# Patient Record
Sex: Female | Born: 1950 | Race: Black or African American | Hispanic: No | Marital: Single | Smoking: Former smoker
Health system: Southern US, Community
[De-identification: ages and names within clinical notes are randomized; demographics above are authoritative.]

## PROBLEM LIST (undated history)

## (undated) DIAGNOSIS — E785 Hyperlipidemia, unspecified: Secondary | ICD-10-CM

## (undated) DIAGNOSIS — J45909 Unspecified asthma, uncomplicated: Secondary | ICD-10-CM

## (undated) DIAGNOSIS — K219 Gastro-esophageal reflux disease without esophagitis: Secondary | ICD-10-CM

## (undated) DIAGNOSIS — L309 Dermatitis, unspecified: Secondary | ICD-10-CM

## (undated) DIAGNOSIS — I10 Essential (primary) hypertension: Secondary | ICD-10-CM

## (undated) DIAGNOSIS — J811 Chronic pulmonary edema: Secondary | ICD-10-CM

## (undated) DIAGNOSIS — E119 Type 2 diabetes mellitus without complications: Secondary | ICD-10-CM

## (undated) HISTORY — PX: TUBAL LIGATION: SHX77

## (undated) HISTORY — PX: COLONOSCOPY: SHX174

## (undated) HISTORY — DX: Chronic pulmonary edema: J81.1

## (undated) HISTORY — PX: ABDOMINAL HYSTERECTOMY: SHX81

## (undated) HISTORY — DX: Essential (primary) hypertension: I10

## (undated) HISTORY — DX: Hyperlipidemia, unspecified: E78.5

## (undated) HISTORY — DX: Gastro-esophageal reflux disease without esophagitis: K21.9

---

## 2001-04-04 DIAGNOSIS — I499 Cardiac arrhythmia, unspecified: Secondary | ICD-10-CM

## 2001-04-04 DIAGNOSIS — J811 Chronic pulmonary edema: Secondary | ICD-10-CM

## 2001-04-04 HISTORY — DX: Cardiac arrhythmia, unspecified: I49.9

## 2001-04-04 HISTORY — DX: Chronic pulmonary edema: J81.1

## 2005-04-04 DIAGNOSIS — J45909 Unspecified asthma, uncomplicated: Secondary | ICD-10-CM

## 2005-04-04 HISTORY — DX: Unspecified asthma, uncomplicated: J45.909

## 2007-12-25 ENCOUNTER — Ambulatory Visit (HOSPITAL_COMMUNITY): Admission: RE | Admit: 2007-12-25 | Discharge: 2007-12-25 | Payer: Self-pay | Admitting: Family Medicine

## 2008-01-03 ENCOUNTER — Emergency Department (HOSPITAL_COMMUNITY): Admission: EM | Admit: 2008-01-03 | Discharge: 2008-01-03 | Payer: Self-pay | Admitting: Emergency Medicine

## 2008-01-09 ENCOUNTER — Ambulatory Visit: Payer: Self-pay | Admitting: Cardiology

## 2008-08-22 ENCOUNTER — Encounter: Payer: Self-pay | Admitting: Physician Assistant

## 2008-08-22 ENCOUNTER — Ambulatory Visit: Payer: Self-pay | Admitting: Cardiology

## 2008-08-22 DIAGNOSIS — I10 Essential (primary) hypertension: Secondary | ICD-10-CM | POA: Insufficient documentation

## 2008-08-25 ENCOUNTER — Encounter: Payer: Self-pay | Admitting: Cardiology

## 2008-08-25 LAB — CONVERTED CEMR LAB
BUN: 12 mg/dL
CO2: 26 meq/L
Chloride: 101 meq/L
Creatinine, Ser: 1.2 mg/dL
MCV: 90.5 fL
Potassium: 4.6 meq/L
RBC: 4.65 M/uL
Sodium: 142 meq/L

## 2008-08-28 ENCOUNTER — Encounter: Payer: Self-pay | Admitting: Cardiology

## 2008-09-05 ENCOUNTER — Ambulatory Visit: Payer: Self-pay | Admitting: Cardiology

## 2008-10-23 ENCOUNTER — Encounter: Payer: Self-pay | Admitting: Cardiology

## 2008-10-23 LAB — CONVERTED CEMR LAB
ALT: 15 units/L
AST: 19 units/L
Alkaline Phosphatase: 68 units/L
Cholesterol: 182 mg/dL
HDL: 63 mg/dL
Total Bilirubin: 0.7 mg/dL
Triglyceride fasting, serum: 78 mg/dL

## 2008-11-14 ENCOUNTER — Encounter: Payer: Self-pay | Admitting: Cardiology

## 2008-11-25 ENCOUNTER — Ambulatory Visit: Payer: Self-pay | Admitting: Cardiology

## 2008-12-26 ENCOUNTER — Ambulatory Visit (HOSPITAL_COMMUNITY): Admission: RE | Admit: 2008-12-26 | Discharge: 2008-12-26 | Payer: Self-pay | Admitting: Family Medicine

## 2009-06-13 ENCOUNTER — Encounter (INDEPENDENT_AMBULATORY_CARE_PROVIDER_SITE_OTHER): Payer: Self-pay | Admitting: *Deleted

## 2009-06-13 LAB — CONVERTED CEMR LAB
ALT: 25 units/L
AST: 23 units/L
Albumin: 4.7 g/dL
Chloride: 101 meq/L
HDL: 61 mg/dL
LDL Cholesterol: 91 mg/dL
Potassium: 4.4 meq/L
Sodium: 141 meq/L
TSH: 1.886 microintl units/mL
Triglycerides: 72 mg/dL

## 2009-12-25 ENCOUNTER — Encounter (INDEPENDENT_AMBULATORY_CARE_PROVIDER_SITE_OTHER): Payer: Self-pay | Admitting: *Deleted

## 2009-12-29 ENCOUNTER — Ambulatory Visit (HOSPITAL_COMMUNITY): Admission: RE | Admit: 2009-12-29 | Discharge: 2009-12-29 | Payer: Self-pay | Admitting: Family Medicine

## 2009-12-30 ENCOUNTER — Ambulatory Visit: Payer: Self-pay | Admitting: Cardiology

## 2009-12-30 ENCOUNTER — Encounter (INDEPENDENT_AMBULATORY_CARE_PROVIDER_SITE_OTHER): Payer: Self-pay | Admitting: *Deleted

## 2009-12-30 DIAGNOSIS — R002 Palpitations: Secondary | ICD-10-CM

## 2009-12-30 DIAGNOSIS — E1169 Type 2 diabetes mellitus with other specified complication: Secondary | ICD-10-CM | POA: Insufficient documentation

## 2009-12-30 DIAGNOSIS — J811 Chronic pulmonary edema: Secondary | ICD-10-CM | POA: Insufficient documentation

## 2009-12-30 DIAGNOSIS — E785 Hyperlipidemia, unspecified: Secondary | ICD-10-CM

## 2009-12-30 DIAGNOSIS — K219 Gastro-esophageal reflux disease without esophagitis: Secondary | ICD-10-CM

## 2009-12-30 LAB — CONVERTED CEMR LAB
Albumin: 4.5 g/dL (ref 3.5–5.2)
Alkaline Phosphatase: 72 units/L (ref 39–117)
Cholesterol: 189 mg/dL (ref 0–200)
Glucose, Bld: 86 mg/dL (ref 70–99)
HDL: 59 mg/dL (ref >39)
Total CHOL/HDL Ratio: 3.2
Total Protein: 7.8 g/dL (ref 6.0–8.3)
Triglycerides: 171 mg/dL — ABNORMAL HIGH (ref <150)
Uric Acid, Serum: 4.4 mg/dL (ref 2.4–7.0)

## 2010-01-05 ENCOUNTER — Encounter: Payer: Self-pay | Admitting: Cardiology

## 2010-01-29 ENCOUNTER — Encounter (INDEPENDENT_AMBULATORY_CARE_PROVIDER_SITE_OTHER): Payer: Self-pay | Admitting: *Deleted

## 2010-05-04 NOTE — Miscellaneous (Signed)
Summary: LABS CMP,LIPIDS,TSH,T4,06/13/2009  Clinical Lists Changes  Observations: Added new observation of CALCIUM: 9.8 mg/dL (06/13/2009 11:19) Added new observation of ALBUMIN: 4.7 g/dL (06/13/2009 11:19) Added new observation of PROTEIN, TOT: 8.0 g/dL (06/13/2009 11:19) Added new observation of SGPT (ALT): 25 units/L (06/13/2009 11:19) Added new observation of SGOT (AST): 23 units/L (06/13/2009 11:19) Added new observation of ALK PHOS: 71 units/L (06/13/2009 11:19) Added new observation of CREATININE: 1.00 mg/dL (06/13/2009 11:19) Added new observation of BUN: 15 mg/dL (06/13/2009 11:19) Added new observation of BG RANDOM: 99 mg/dL (06/13/2009 11:19) Added new observation of CO2 PLSM/SER: 29 meq/L (06/13/2009 11:19) Added new observation of CL SERUM: 101 meq/L (06/13/2009 11:19) Added new observation of K SERUM: 4.4 meq/L (06/13/2009 11:19) Added new observation of NA: 141 meq/L (06/13/2009 11:19) Added new observation of LDL: 91 mg/dL (06/13/2009 11:19) Added new observation of HDL: 61 mg/dL (06/13/2009 11:19) Added new observation of TRIGLYC TOT: 72 mg/dL (06/13/2009 11:19) Added new observation of CHOLESTEROL: 166 mg/dL (06/13/2009 11:19) Added new observation of TSH: 1.886 microintl units/mL (06/13/2009 11:19) Added new observation of T4, FREE: 8.0 ng/dL (06/13/2009 11:19)

## 2010-05-04 NOTE — Letter (Signed)
Summary: Appointment - Missed  [facility] [place]. [address] [place] [address]   Phone: [phone]  Fax: [phone]     January 29, 2010 MRN: [id number]   Lindsey Buckley [address] [place],   [address]   Dear [name],  [facility] records indicate you missed your appointment on       01/29/10 NURSE VISIT     .                                    It is very important that we reach you to reschedule this appointment. We look forward to participating in your health care needs. Please contact us at the number listed above at your earliest convenience to reschedule this appointment.     Sincerely,    Public relations account executive

## 2010-05-04 NOTE — Letter (Signed)
Summary: [facility] Lab Work Doctor, general practice at [place]. [address] [place] [address]   Phone: [phone]  Fax: [phone]     December 30, 2009 MRN: [id number]   Lindsey Buckley [address] [place], [place]  [address]      YOUR LAB WORK IS DUE   TOMORROW   December 31, 2009  Please go to [facility], located across the street from [facility] on the second floor.  Hours are Monday - Friday 7am until 7:30pm         Saturday 8am until 12noon    _X_  DO NOT EAT OR DRINK AFTER MIDNIGHT EVENING PRIOR TO LABWORK  __ YOUR LABWORK IS NOT FASTING --YOU MAY EAT PRIOR TO LABWORK

## 2010-05-04 NOTE — Letter (Signed)
Summary: [facility] Results Doctor, general practice at [place]. [address] [place] [address]   Phone: [phone]  Fax: [phone]      January 05, 2010 MRN: [id number]   Lindsey Buckley [address] [place], [place]  [address]   Dear [name],  Your test ordered by Rande Lawman has been reviewed by your physician (or physician assistant) and was found to be normal or stable. Your physician (or physician assistant) felt no changes were needed at this time.  ____ Echocardiogram  ____ Cardiac Stress Test  __X__ Lab Work  ____ Peripheral vascular study of arms, legs or neck  ____ CT scan or X-ray  ____ Lung or Breathing test  ____ Other:  Please continue on current medical treatment.  Thank you. Please continue on current medical treatment.  [name], MD, F.A.C.C

## 2010-05-04 NOTE — Assessment & Plan Note (Signed)
Summary: 1 YR F/U PER PT PHONE CALL/TG  Medications Added METOPROLOL SUCCINATE 100 MG XR24H-TAB (METOPROLOL SUCCINATE) Take one tablet by mouth daily PRILOSEC 20 MG CPDR (OMEPRAZOLE) take 1 tab every other day NAPROXEN 500 MG TABS (NAPROXEN) take 1 tab daily NITROSTAT 0.4 MG SUBL (NITROGLYCERIN) 1 tablet under tongue at onset of chest pain; you may repeat every 5 minutes for up to 3 doses. CHLORTHALIDONE 25 MG TABS (CHLORTHALIDONE) Take one half  tablet by mouth daily      Allergies Added: NKDA  Visit Type:  Follow-up Primary Provider:  Dr. Cindie Laroche   History of Present Illness: Lindsey Buckley returns to the office for continuing assessment and treatment of chest discomfort and palpitations in the setting of multiple cardiovascular risk factors, most notably obesity and hypertension.  Her antihypertensive regimen has been modified, but she does not know the reason for the reduction in her dose of metoprolol and discontinuation of chlorthalidone.  Blood pressure control has been suboptimal with systolics not infrequently above Q000111Q and diastolics as high as [id number].  Symptomatically, she is improved with occasional brief episodes of chest discomfort and only a single episode of palpitations that she recalls.  She has had gout for which she uses naproxen as needed.  She has not required evaluation in the emergency department nor hospitalization.   Current Medications (verified): 1)  Nifedipine Cr Osmotic 90 Mg Xr24h-Tab (Nifedipine) .Marland Kitchen.. 1 Daily 2)  Metoprolol Succinate 100 Mg Xr24h-Tab (Metoprolol Succinate) .... Take One Tablet By Mouth Daily 3)  Crestor 40 Mg Tabs (Rosuvastatin Calcium) .... Take One Tablet By Mouth Daily. 4)  Verapamil Hcl Cr 240 Mg Xr24h-Cap (Verapamil Hcl) .... Take One Capsule By Mouth Daily 5)  Benicar 40 Mg Tabs (Olmesartan Medoxomil) .Marland Kitchen.. 1 Daily 6)  Nitroglycerin 0.4 Mg Subl (Nitroglycerin) .... One Tablet Under Tongue Every 5 Minutes As Needed For Chest Pain---May Repeat  Times Three 7)  Prilosec 20 Mg Cpdr (Omeprazole) .... Take 1 Tab Every Other Day 8)  Naproxen 500 Mg Tabs (Naproxen) .... Take 1 Tab Daily 9)  Nitrostat 0.4 Mg Subl (Nitroglycerin) .Marland Kitchen.. 1 Tablet Under Tongue At Onset of Chest Pain; You May Repeat Every 5 Minutes For Up To 3 Doses. 10)  Chlorthalidone 25 Mg Tabs (Chlorthalidone) .... Take One Half  Tablet By Mouth Daily  Allergies (verified): No Known Drug Allergies  Past History:  PMH, FH, and Social History reviewed and updated.  Past Medical History: Palpitations, dyspnea and chest discomfort-negative event recorder PULMONARY EDEMA (ICD-514)and chest pain; 2007 insig. CAD and nl EF- [facility] in [place] HYPERLIPIDEMIA (ICD-272.4) HYPERTENSION (ICD-401.9) Gastroesophageal reflux disease  Review of Systems  The patient denies weight loss, weight gain, vision loss, decreased hearing, hoarseness, syncope, peripheral edema, prolonged cough, and headaches.    Vital Signs:  Patient profile:   60 year old female Weight:      203 pounds BMI:     33.90 Pulse rate:   83 / minute BP sitting:   139 / 83  (right arm)  Vitals Entered By: Lindsey Sou, CNA (December 30, 2009 1:36 PM)  Physical Exam  General:  Obese; wWell-developed; no acute distress   Neck: No JVD, no carotid bruits;  Lungs: No tachypnea, clear without rales, rhonchi or wheezes;  Cardiovascular: normal PMI; normal S1 and S2; fourth heart sound present Abdomen: BS normal; soft and non-tender without masses or organomegaly;  Skin:  Warm, no significant lesions;  Extremities: No edema; distal pulses are intact    Impression &  Recommendations:  Problem # 1:  CHEST PAIN (ICD-786.50) Chest pain is manageable in terms of intensity and frequency and almost certainly does not reflect coronary artery disease.  We will continue to treat symptomatically and have provided the patient with a prescription for nitroglycerin.  Problem # 2:  HYPERTENSION (ICD-401.9) Blood  pressure control is suboptimal on 4 drugs including two calcium channel antagonists.  Chlorthalidone is likely to be the most effective single agent that could be added to this medical regimen.  In case the drug was discontinued as a result of electrolyte abnormalities or renal dysfunction, the dose will be decreased to 12.5 mg q.d.  I will check a metabolic profile in one month at which time Lindsey Buckley will return for reassessment of blood pressure by the cardiology nurses.  I will see her again in one year.  I requested that she increase her activity level and advised her that weight loss would be of significant benefit to her.  Other Orders: Future Orders: T-Basic Metabolic Panel ([id number]) ... 01/07/2010 T-Comprehensive Metabolic Panel ([id number]) ... 12/31/2009 T-Lipid Profile [phone]) ... 12/31/2009 T-Uric Acid (Blood) [phone]) ... 12/31/2009  EKG  Procedure date:  12/30/2009  Findings:      Normal sinus rhythm Left axis deviation Slightly delayed R-wave progression Nonspecific T-wave abnormality, new since a previous tracing of 08/22/08.   Patient Instructions: 1)  Your physician recommends that you schedule a follow-up appointment in: 1 year 2)  Your physician recommends that you return for lab work in: today and in  1 month 3)  You have been referred to nurse visit in   1 month please bring bp diary to nurse visit 4)  Your physician has requested that you regularly monitor and record your blood pressure readings at home.  Please use the same machine at the same time of day to check your readings and record them to bring to your follow-up visit. 5)  Your physician discussed the importance of regular exercise and recommended that you start or continue a regular exercise program for good health. 6)  Your physician has recommended you make the following change in your medication:  change metoprolol tartrate to metoprolol succinate 100mg  daily, chlorthalidone 25mg  , 1/2  tablet daily, nitroglycerin 0.4mg  under tongue evfery 5 minutes x3  as needed for chest pain  Prescriptions: CHLORTHALIDONE 25 MG TABS (CHLORTHALIDONE) Take one half  tablet by mouth daily  #15 x 6   Entered by:   Tye Savoy RN   Authorized by:   Yehuda Savannah, MD, [facility]   Signed by:   Tye Savoy RN on 12/30/2009   Method used:   Electronically to        [facility]. [phone]* (retail)       [address], [place]  [address]       Ph: [phone]       Fax: [phone]   RxID:   [id number]   Handout requested. METOPROLOL SUCCINATE 100 MG XR24H-TAB (METOPROLOL SUCCINATE) Take one tablet by mouth daily  #30 x 6   Entered by:   Tye Savoy RN   Authorized by:   Yehuda Savannah, MD, [facility]   Signed by:   Tye Savoy RN on 12/30/2009   Method used:   Electronically to        [facility]. [phone]* (retail)       [address] [place],  [place]  [address]       Ph: [phone]       Fax: [phone]   [id number] NITROSTAT 0.4 MG SUBL (NITROGLYCERIN) 1 tablet under tongue at onset of chest pain; you may repeat every 5 minutes for up to 3 doses.  #25 x 3   Entered by:   Tye Savoy RN   Authorized by:   Yehuda Savannah, MD, [facility]   Signed by:   Tye Savoy RN on 12/30/2009   Method used:   Electronically to        [facility]. [phone]* (retail)       [address], [place]  [address]       Ph: [phone]       Fax: [phone]   RxID:   [id number]

## 2010-07-16 ENCOUNTER — Other Ambulatory Visit: Payer: Self-pay | Admitting: Cardiology

## 2010-08-16 ENCOUNTER — Other Ambulatory Visit: Payer: Self-pay | Admitting: Cardiology

## 2010-08-17 NOTE — Letter (Signed)
January 09, 2008    Unk Lightning, MD  [address] [place] [address]   RE:  Lindsey Buckley, Lindsey Buckley  MRN:  [id number]  /  DOB:  27-Dec-1950   Dear Delfino Lovett:   [name] is seen in the office today after evaluation in the Emergency  Department for chest discomfort.  This nice woman has long history of  hypertension that has generally been well controlled.  She has also had  hyperlipidemia that has not been at optimal levels despite high dose  statins.  She was hospitalized at [place]  in 2007 with pulmonary  edema and respiratory failure requiring mechanical ventilation.  Stress  testing and cardiac catheterization at that time showed low normal left  ventricular systolic function and minimal coronary artery disease.  A CT  scan of chest was unrevealing.  She eventually improved on medical  therapy and has had no recurrent signs or symptoms of congestive heart  failure.  She has continued to have intermittent chest discomfort.  Most  recently, she developed left inframammary pain that occurred  unpredictably and lasted minutes to hours.  This has been prominent over  the past week or 2, but has been absent for the past day or 2.  She  previously took nitroglycerin, but was advised that this was not  necessary.  Evaluation in the Emergency Department was unrevealing.   Mr. Lindsey Buckley brings about a inch of medical records, these were reviewed.  No etiology for chest discomfort was determined in [place].  She had  slightly abnormal renal function at that time.   She is also said to have a history of GERD, but she denies this, and is  receiving no medication for that problem at the present time.  She  recently had a lipid profile showing a total cholesterol of 250 by her  report prompting initiation of treatment with WelChol.  She has not  tolerated this due to adverse GI effects.   CURRENT MEDICATIONS:  1. Nifedipine XL 90 mg daily.  2. Metoprolol ER 100 mg  daily.  3. HCTZ 25 mg daily.  4. Simvastatin 80 mg daily.   ALLERGIES:  She has no known medical allergies.  She has suffered an  adverse reaction to ACE inhibitors in the past.   SOCIAL HISTORY:  Unemployed; previously worked as a Psychologist, counselling.  She walks on a daily basis without difficulty.  She is divorced with 3  adult children.  She moved to this area to be closer to family.   FAMILY HISTORY:  Father and mother are both deceased, but had no  significant chronic medical conditions.  One of 4 siblings underwent  CABG surgery and one has uterine cancer.   REVIEW OF SYSTEMS:  Need for corrective lenses; partial upper dentures;  occasional mild palpitations; occasional diarrhea; arthritic discomfort  in her back.  She has a history of asthma.   PHYSICAL EXAMINATION:  GENERAL:  A pleasant overweight woman, in no  acute distress.  The weight is 196.  HEENT:  Anicteric sclerae; normal lids and conjunctivae.  NECK:  No jugular venous distention; normal carotid upstrokes without  bruits.  ENDOCRINE:  No thyromegaly.  HEMATOPOIETIC:  No adenopathy.  LUNGS:  Clear.  CARDIAC:  Normal first and second heart sounds.  ABDOMEN:  Soft and nontender; no organomegaly.  EXTREMITIES:  Normal distal pulses; no edema.  NEUROLOGIC:  Symmetric strength and tone; normal cranial nerves.   EKG:  Sinus bradycardia; left axis deviation;  nonspecific T-wave  abnormality.  No previous tracing for comparison.   Laboratory from the Emergency Department shows a normal CBC, normal  chemistry profile including a creatinine of 1.0, normal LFTs and normal  cardiac markers.   IMPRESSION:  [name] has intermittent chest discomfort that is  atypical and not clearly of cardiac etiology.  The etiology of  respiratory failure in 2007 is not entirely clear.  There was some  findings suggesting pneumonia, but radiographically, her problem was  read as diffuse pulmonary edema.  She may have had congestive  heart  failure with preserved left ventricular systolic function.  In any case,  she has done quite well on medical therapy, which will be continued.   Lipid lowering therapy is suboptimal.  I have asked her to change from  simvastatin to rosuvastatin 40 mg daily.  If this is inadequate, as an  MI, should provide additional lipid lowering efficacy.   I appreciate the opportunity to follow this nice woman with you and will  see her again in 4 months.    Sincerely,      Cristopher Estimable. Lattie Haw, MD, [facility]  Electronically Signed    RMR/MedQ  DD: 01/09/2008  DT: 01/10/2008  Job #: [id number]

## 2010-09-18 ENCOUNTER — Other Ambulatory Visit: Payer: Self-pay | Admitting: Cardiology

## 2010-11-26 ENCOUNTER — Other Ambulatory Visit (HOSPITAL_COMMUNITY): Payer: Self-pay | Admitting: Family Medicine

## 2010-11-26 DIAGNOSIS — Z139 Encounter for screening, unspecified: Secondary | ICD-10-CM

## 2011-01-03 ENCOUNTER — Ambulatory Visit (HOSPITAL_COMMUNITY)
Admission: RE | Admit: 2011-01-03 | Discharge: 2011-01-03 | Disposition: A | Discharge status: Home / Self Care | Payer: Medicaid Other | Source: Ambulatory Visit | Attending: Family Medicine | Admitting: Family Medicine

## 2011-01-03 DIAGNOSIS — Z1231 Encounter for screening mammogram for malignant neoplasm of breast: Secondary | ICD-10-CM | POA: Insufficient documentation

## 2011-01-03 DIAGNOSIS — Z139 Encounter for screening, unspecified: Secondary | ICD-10-CM

## 2011-01-04 LAB — COMPREHENSIVE METABOLIC PANEL
AST: 25
Albumin: 3.9
Alkaline Phosphatase: 73
BUN: 24 — ABNORMAL HIGH
CO2: 26
Calcium: 9.6
Chloride: 106
Creatinine, Ser: 1.02
GFR calc Af Amer: >60
GFR calc non Af Amer: 56 — ABNORMAL LOW
Total Bilirubin: 0.9
Total Protein: 7.8

## 2011-01-04 LAB — CBC
Hemoglobin: 13.9
RDW: 13.4
WBC: 9.8

## 2011-01-04 LAB — POCT CARDIAC MARKERS
CKMB, poc: 2.2
Myoglobin, poc: 60.3
Myoglobin, poc: 67.7

## 2011-03-16 ENCOUNTER — Ambulatory Visit (INDEPENDENT_AMBULATORY_CARE_PROVIDER_SITE_OTHER): Payer: Medicaid Other | Admitting: Adult Health

## 2011-03-16 ENCOUNTER — Encounter: Payer: Self-pay | Admitting: Adult Health

## 2011-03-16 ENCOUNTER — Ambulatory Visit (HOSPITAL_COMMUNITY)
Admission: RE | Admit: 2011-03-16 | Discharge: 2011-03-16 | Disposition: A | Discharge status: Home / Self Care | Payer: Medicaid Other | Source: Ambulatory Visit | Attending: Adult Health | Admitting: Adult Health

## 2011-03-16 DIAGNOSIS — R0989 Other specified symptoms and signs involving the circulatory and respiratory systems: Secondary | ICD-10-CM

## 2011-03-16 DIAGNOSIS — J811 Chronic pulmonary edema: Secondary | ICD-10-CM

## 2011-03-16 DIAGNOSIS — E785 Hyperlipidemia, unspecified: Secondary | ICD-10-CM

## 2011-03-16 DIAGNOSIS — R059 Cough, unspecified: Secondary | ICD-10-CM | POA: Insufficient documentation

## 2011-03-16 DIAGNOSIS — E782 Mixed hyperlipidemia: Secondary | ICD-10-CM

## 2011-03-16 DIAGNOSIS — R05 Cough: Secondary | ICD-10-CM | POA: Insufficient documentation

## 2011-03-16 DIAGNOSIS — I10 Essential (primary) hypertension: Secondary | ICD-10-CM

## 2011-03-16 MED ORDER — GUAIFENESIN ER 600 MG PO TB12: 1200.0000 mg | ORAL_TABLET | Freq: Two times a day (BID) | ORAL | Status: DC

## 2011-03-16 MED ORDER — ROSUVASTATIN CALCIUM 40 MG PO TABS: 40.0000 mg | ORAL_TABLET | Freq: Every day | ORAL | Status: DC

## 2011-03-16 MED ORDER — GUAIFENESIN ER 600 MG PO TB12: 600.0000 mg | ORAL_TABLET | Freq: Two times a day (BID) | ORAL | Status: DC

## 2011-03-16 NOTE — Patient Instructions (Signed)
Your physician recommends that you schedule a follow-up appointment in: 6 months  Chest x ray today  Your physician has recommended you make the following change in your medication: Mucinex 600 mg 2 tabs twice daily

## 2011-03-16 NOTE — Assessment & Plan Note (Signed)
Currently well controlled on 4 antihypertensives. She has had BMET per Dr. Lorriane Shire two weeks ago. Will request records. No changes in her medications.

## 2011-03-16 NOTE — Assessment & Plan Note (Signed)
Refill of crestor is provided. Labs are followed by Dr. Lorriane Shire but she ran out of medications.

## 2011-03-16 NOTE — Progress Notes (Signed)
   HPI: Lindsey Buckley is a 60 y/o patient of Dr. Lattie Haw we are following for ongoing annual assessment and treatment of hypertension and palpitations. She is complaining today of chest congestion and coughing. She is on steroids and abx at this time per Dr. Lorriane Shire. She has less palpitations and BP is well controlled. She has no complaints of chest discomfort.  Allergies  Allergen Reactions  . Ace Inhibitors   . Lisinopril     Current Outpatient Prescriptions  Medication Sig Dispense Refill  . chlorthalidone (HYGROTON) 25 MG tablet TAKE 1/2 TABLET BY MOUTH DAILY  45 tablet  5  . metoprolol (TOPROL-XL) 50 MG 24 hr tablet Take 50 mg by mouth daily.        . naproxen (NAPROSYN) 500 MG tablet Take 500 mg by mouth as needed.        Marland Kitchen NIFEdipine (ADALAT CC) 90 MG 24 hr tablet Take 90 mg by mouth daily.        Marland Kitchen olmesartan (BENICAR) 40 MG tablet Take 40 mg by mouth daily.        Marland Kitchen omeprazole (PRILOSEC) 20 MG capsule Take 20 mg by mouth daily.        . rosuvastatin (CRESTOR) 40 MG tablet Take 1 tablet (40 mg total) by mouth daily.  90 tablet  3  . verapamil (COVERA HS) 240 MG (CO) 24 hr tablet Take 240 mg by mouth at bedtime.        Marland Kitchen guaiFENesin (MUCINEX) 600 MG 12 hr tablet Take 2 tablets (1,200 mg total) by mouth 2 (two) times daily.  60 tablet  2    Past Medical History  Diagnosis Date  . Pulmonary congestion and hypostasis   . Other and unspecified hyperlipidemia   . Unspecified essential hypertension   . Chest pain     history of  . GERD (gastroesophageal reflux disease)       [id number] of systems complete and found to be negative unless listed above PHYSICAL EXAM BP 129/85  Pulse 71  Ht 5\' 4"  (1.626 m)  Wt 203 lb (92.08 kg)  BMI 34.84 kg/m2  General: Well developed, well nourished, in no acute distress Head: Eyes PERRLA, No xanthomas.   Normal cephalic and atramatic  Lungs: Bilateral crackles without wheezes or rhonchi, frequent cough. Heart: HRRR S1 S2, without MRG.   Pulses are 2+ & equal.            No carotid bruit. No JVD.  No abdominal bruits. No femoral bruits. Abdomen: Bowel sounds are positive, abdomen soft and non-tender without masses or                  Hernia's noted. Msk:  Back normal, normal gait. Normal strength and tone for age. Extremities: No clubbing, cyanosis or edema.  DP +1 Neuro: Alert and oriented X 3. Psych:  Good affect, responds appropriately  EKG:NSR Rate of 67 bpm. Nonspecific anterior changes.  ASSESSMENT AND PLAN

## 2011-03-16 NOTE — Assessment & Plan Note (Signed)
There is no evidence of fluid overload on clinical exam except for lung congestion. She has not had a CXR to evaluate her congestion, although this is being treated. We will have this completed for further evaluation with copy to Dr. Lorriane Shire.

## 2011-03-17 ENCOUNTER — Telehealth: Payer: Self-pay | Admitting: Adult Health

## 2011-03-17 NOTE — Telephone Encounter (Signed)
PT WOULD LIKE X-RAY RESULTS/TMJ

## 2011-03-17 NOTE — Telephone Encounter (Signed)
Results called to patient.

## 2011-03-22 ENCOUNTER — Other Ambulatory Visit: Payer: Self-pay | Admitting: Cardiology

## 2011-03-22 ENCOUNTER — Encounter: Payer: Self-pay | Admitting: Cardiology

## 2011-03-23 ENCOUNTER — Other Ambulatory Visit: Payer: Self-pay | Admitting: *Deleted

## 2011-03-23 MED ORDER — ROSUVASTATIN CALCIUM 40 MG PO TABS: 40.0000 mg | ORAL_TABLET | Freq: Every day | ORAL | Status: DC

## 2011-04-13 ENCOUNTER — Telehealth: Payer: Self-pay

## 2011-05-03 ENCOUNTER — Other Ambulatory Visit: Payer: Self-pay | Admitting: *Deleted

## 2011-05-03 MED ORDER — ROSUVASTATIN CALCIUM 40 MG PO TABS: 40.0000 mg | ORAL_TABLET | Freq: Every day | ORAL | Status: DC

## 2011-08-25 ENCOUNTER — Other Ambulatory Visit: Payer: Self-pay | Admitting: Adult Health

## 2011-09-13 ENCOUNTER — Ambulatory Visit (INDEPENDENT_AMBULATORY_CARE_PROVIDER_SITE_OTHER): Payer: Medicaid Other | Admitting: Adult Health

## 2011-09-13 ENCOUNTER — Encounter: Payer: Self-pay | Admitting: Adult Health

## 2011-09-13 VITALS — BP 131/85 | HR 69 | Ht 65.0 in | Wt 204.0 lb

## 2011-09-13 DIAGNOSIS — I10 Essential (primary) hypertension: Secondary | ICD-10-CM

## 2011-09-13 DIAGNOSIS — E785 Hyperlipidemia, unspecified: Secondary | ICD-10-CM

## 2011-09-13 DIAGNOSIS — R002 Palpitations: Secondary | ICD-10-CM

## 2011-09-13 NOTE — Assessment & Plan Note (Signed)
Currently poorly controlled. She is on higher dose of crestor 40 mg daily. I have encouraged low cholesterol diet. Hopefully follow-up labs with show improvement.

## 2011-09-13 NOTE — Assessment & Plan Note (Signed)
Currently very well controlled on medications. She is without complaint of HA or dizziness. No chest pressure with walking. I have encouraged her to continue exercise regimen and continue to lose weight.

## 2011-09-13 NOTE — Patient Instructions (Signed)
Your physician recommends that you schedule a follow-up appointment in: 6 month follow up

## 2011-09-13 NOTE — Progress Notes (Signed)
   HPI: Lindsey Buckley is a 61 y/o patient of Dr. Lattie Haw we are following for hypertension and palpitations. On last visit she was doing well with the exception of some DOE. She has since felt better with this. She is walking 3 miles a day and losing weight. She has no cardiac complaints at this time. Recent labs have been reviewed. Mildly elevated creatinine of 1.27 is noted. Cholesterol 233, TG 118, HDL 54, LDL 155. She is on crestor and is followed by Dr. Lorriane Shire for this.  Allergies  Allergen Reactions  . Ace Inhibitors   . Lisinopril     Current Outpatient Prescriptions  Medication Sig Dispense Refill  . chlorthalidone (HYGROTON) 25 MG tablet TAKE 1/2 TABLET BY MOUTH DAILY.  15 tablet  3  . metoprolol (TOPROL-XL) 100 MG 24 hr tablet TAKE 1 TABLET BY MOUTH DAILY  30 tablet  6  . naproxen (NAPROSYN) 500 MG tablet Take 500 mg by mouth as needed.        Marland Kitchen NIFEdipine (ADALAT CC) 90 MG 24 hr tablet Take 90 mg by mouth daily.        Marland Kitchen olmesartan (BENICAR) 40 MG tablet Take 40 mg by mouth daily.        Marland Kitchen omeprazole (PRILOSEC) 20 MG capsule Take 20 mg by mouth daily.        . rosuvastatin (CRESTOR) 40 MG tablet Take 1 tablet (40 mg total) by mouth daily.  90 tablet  3  . verapamil (COVERA HS) 240 MG (CO) 24 hr tablet Take 240 mg by mouth at bedtime.        Marland Kitchen DISCONTD: metoprolol (TOPROL-XL) 50 MG 24 hr tablet Take 50 mg by mouth daily.          Past Medical History  Diagnosis Date  . Pulmonary congestion and hypostasis   . Other and unspecified hyperlipidemia   . Unspecified essential hypertension   . Chest pain     history of  . GERD (gastroesophageal reflux disease)   . Pulmonary edema     Past Surgical History  Procedure Date  . Tubal ligation   . Abdominal hysterectomy   . Colonoscopy     [id number] of systems complete and found to be negative unless listed above  PHYSICAL EXAM BP 131/85  Pulse 69  Ht 5\' 5"  (1.651 m)  Wt 204 lb (92.534 kg)  BMI 33.95 kg/m2  General:  Well developed, well nourished, obese in no acute distress Head: Eyes PERRLA, No xanthomas.   Normal cephalic and atramatic  Lungs: Clear bilaterally to auscultation and percussion. Heart: HRRR S1 S2, without MRG.  Pulses are 2+ & equal.            No carotid bruit. No JVD.  No abdominal bruits. No femoral bruits. Abdomen: Bowel sounds are positive, abdomen soft and non-tender without masses or                  Hernia's noted. Msk:  Back normal, normal gait. Normal strength and tone for age. Extremities: No clubbing, cyanosis or edema.  DP +1 Neuro: Alert and oriented X 3. Psych:  Good affect, responds appropriately   ASSESSMENT AND PLAN

## 2011-09-13 NOTE — Assessment & Plan Note (Signed)
Occasional related to stress and caffeine. She is encouraged to cut back on Pepsi.

## 2011-10-27 ENCOUNTER — Other Ambulatory Visit: Payer: Self-pay | Admitting: Adult Health

## 2012-03-03 ENCOUNTER — Other Ambulatory Visit: Payer: Self-pay | Admitting: Adult Health

## 2012-05-16 ENCOUNTER — Encounter: Payer: Self-pay | Admitting: Physician Assistant

## 2012-05-16 ENCOUNTER — Ambulatory Visit: Payer: Medicaid Other | Admitting: Physician Assistant

## 2012-05-16 ENCOUNTER — Ambulatory Visit (INDEPENDENT_AMBULATORY_CARE_PROVIDER_SITE_OTHER): Payer: Medicaid Other | Admitting: Physician Assistant

## 2012-05-16 ENCOUNTER — Encounter: Payer: Self-pay | Admitting: *Deleted

## 2012-05-16 VITALS — BP 124/78 | HR 69 | Ht 65.0 in | Wt 207.8 lb

## 2012-05-16 DIAGNOSIS — I10 Essential (primary) hypertension: Secondary | ICD-10-CM

## 2012-05-16 DIAGNOSIS — R079 Chest pain, unspecified: Secondary | ICD-10-CM

## 2012-05-16 DIAGNOSIS — K219 Gastro-esophageal reflux disease without esophagitis: Secondary | ICD-10-CM

## 2012-05-16 DIAGNOSIS — E785 Hyperlipidemia, unspecified: Secondary | ICD-10-CM

## 2012-05-16 DIAGNOSIS — R002 Palpitations: Secondary | ICD-10-CM

## 2012-05-16 MED ORDER — OMEPRAZOLE 20 MG PO CPDR: 20.0000 mg | DELAYED_RELEASE_CAPSULE | Freq: Two times a day (BID) | ORAL | Status: DC

## 2012-05-16 NOTE — Assessment & Plan Note (Signed)
Patient had 1-1/2 week history of intermittent chest pain that is sometimes relieved with nitroglycerin and other times not. She has had excessive belching and heartburn as well. I believe her pain is probably coming from GI origin. I have asked her to increase her Prilosec to 20 mg b.i.d. We will schedule a stress Myoview to rule out ischemia with her prior history. She will follow up with Dr. Lattie Haw after the stress test.

## 2012-05-16 NOTE — Assessment & Plan Note (Signed)
The patient continues to have help occasions on occasion which she controls by taking an extra Toprol. I told her she can continue to do that.

## 2012-05-16 NOTE — Assessment & Plan Note (Signed)
Patient has had worsening indigestion and belching. I've increased her Prilosec. She may need to see a gastroenterologist.

## 2012-05-16 NOTE — Progress Notes (Signed)
HPI:  This is a very pleasant 62 year old African American female patient Dr. Lattie Haw who is here today for further evaluation of chest pain. She has a history of hypertension and palpitations. She had a remote history of pulmonary edema back in 2007 low living in [place]. She had a cardiac catheterization at [facility] there that showed insignificant CAD and normal ejection fraction. She has had event recorders that have not shown arrhythmias.  The patient comes in today complaining of one half week history of a pressure under her left breast that comes and goes. It happens every 3-4 days and will last 10-15 minutes at a time. She tried nitroglycerin and it sometimes helps but other times doesn't. She has had excessive belching and indigestion lately. She walks 15-20 minutes daily without any symptoms. Activity does not cause any chest pain. She recently saw Dr. Cindie Laroche who ordered blood work and told her she needed a stress test. Her blood work seemed stable except for an elevated triglyceride. She also had palpitations the other night which eased with taking anextra Toprol. This only bothers her periodically. She has no radiation of the pain, associated dyspnea, dizziness, or presyncope.   Allergies: -- Ace Inhibitors   -- Lisinopril   Current Outpatient Prescriptions on File Prior to Visit: chlorthalidone (HYGROTON) 25 MG tablet, TAKE 1/2 TABLET BY MOUTH DAILY., Disp: 15 tablet, Rfl: 6 naproxen (NAPROSYN) 500 MG tablet, Take 500 mg by mouth as needed.  , Disp: , Rfl:  NIFEdipine (ADALAT CC) 90 MG 24 hr tablet, Take 90 mg by mouth daily.  , Disp: , Rfl:  olmesartan (BENICAR) 40 MG tablet, Take 40 mg by mouth daily.  , Disp: , Rfl:  rosuvastatin (CRESTOR) 40 MG tablet, Take 1 tablet (40 mg total) by mouth daily., Disp: 90 tablet, Rfl: 3 TOPROL XL 100 MG 24 hr tablet, TAKE ONE TABLET DAILY., Disp: 30 each, Rfl: 12 verapamil (COVERA HS) 240 MG (CO) 24 hr tablet, Take 240 mg by mouth  at bedtime.  , Disp: , Rfl:   No current facility-administered medications on file prior to visit.   Past Medical History:   Pulmonary congestion and hypostasis                          Other and unspecified hyperlipidemia                         Unspecified essential hypertension                           Chest pain                                                     Comment:history of   GERD (gastroesophageal reflux disease)                       Pulmonary edema                                             Past Surgical History:   TUBAL LIGATION  ABDOMINAL HYSTERECTOMY                                        COLONOSCOPY                                                  Review of patient's family history indicates:   Uterine cancer                 Sister                   Social History   Marital Status: Single              Spouse Name:                      Years of Education:                 Number of children:             Occupational History Occupation          Fish farm manager            Comment              Full time                                 Social History Main Topics   Smoking Status: Former Smoker                   Packs/Day: 0.00  Years:         Smokeless Status: Never Used                       Alcohol Use: No             Drug Use: No             Sexual Activity: Not on file        Other Topics            Concern   None on file  Social History Narrative   Divorced   3 children   No regular exercise    ROS:she complains of significant indigestion recently as well as increased belching, regular hot flashes, otherwise review of systems normal unless stated in history of present illness   PHYSICAL EXAM: Well-nournished, in no acute distress. Neck: No JVD, HJR, Bruit, or thyroid enlargement  Lungs: No tachypnea, clear without wheezing, rales, or rhonchi  Cardiovascular: RRR, PMI not displaced, heart sounds normal,  no murmurs, gallops, bruit, thrill, or heave.  Abdomen: BS normal. Soft without organomegaly, masses, lesions or tenderness.  Extremities: without cyanosis, clubbing or edema. Good distal pulses bilateral  SKin: Warm, no lesions or rashes   Musculoskeletal: No deformities  Neuro: no focal signs  BP 124/78  Pulse 69  Ht 5\' 5"  (1.651 m)  Wt 207 lb 12 oz (94.235 kg)  BMI 34.57 kg/m2  SpO2 97%   [id number] sinus rhythm, low voltage, poor R-wave progression, no acute change

## 2012-05-16 NOTE — Assessment & Plan Note (Signed)
Stable

## 2012-05-16 NOTE — Patient Instructions (Addendum)
Your physician recommends that you schedule a follow-up appointment in: After your Stress Test  Stress Test   Your physician has recommended you make the following change in your medication:  1 - INCREASE Prilosec to 20 mg twice a day

## 2012-05-16 NOTE — Assessment & Plan Note (Signed)
Recent blood work showed increased triglycerides. I spoke to the patient about reducing the sugars in her diet.

## 2012-05-30 ENCOUNTER — Encounter (HOSPITAL_COMMUNITY): Payer: Medicaid Other

## 2012-05-30 ENCOUNTER — Ambulatory Visit (HOSPITAL_COMMUNITY)
Admission: RE | Admit: 2012-05-30 | Discharge: 2012-05-30 | Disposition: A | Discharge status: Home / Self Care | Payer: Medicare Other | Source: Ambulatory Visit | Attending: Physician Assistant | Admitting: Physician Assistant

## 2012-05-30 ENCOUNTER — Encounter (HOSPITAL_COMMUNITY): Payer: Medicare Other

## 2012-05-30 ENCOUNTER — Encounter (HOSPITAL_COMMUNITY)
Admission: RE | Admit: 2012-05-30 | Discharge: 2012-05-30 | Disposition: A | Discharge status: Home / Self Care | Payer: Medicare Other | Source: Ambulatory Visit | Attending: Physician Assistant | Admitting: Physician Assistant

## 2012-05-30 ENCOUNTER — Ambulatory Visit (HOSPITAL_COMMUNITY): Payer: Medicare Other

## 2012-05-30 ENCOUNTER — Encounter (HOSPITAL_COMMUNITY): Payer: Self-pay

## 2012-05-30 DIAGNOSIS — R079 Chest pain, unspecified: Secondary | ICD-10-CM | POA: Insufficient documentation

## 2012-05-30 DIAGNOSIS — R002 Palpitations: Secondary | ICD-10-CM

## 2012-05-30 HISTORY — DX: Unspecified asthma, uncomplicated: J45.909

## 2012-05-30 MED ORDER — TECHNETIUM TC 99M SESTAMIBI - CARDIOLITE
30.0000 | Freq: Once | INTRAVENOUS | Status: AC | PRN
  Administered 2012-05-30: 11:00:00 29 via INTRAVENOUS

## 2012-05-30 MED ORDER — SODIUM CHLORIDE 0.9 % IJ SOLN
INTRAMUSCULAR | Status: AC
  Administered 2012-05-30: 10 mL via INTRAVENOUS
  Filled 2012-05-30: qty 10

## 2012-05-30 MED ORDER — TECHNETIUM TC 99M SESTAMIBI - CARDIOLITE
10.0000 | Freq: Once | INTRAVENOUS | Status: AC | PRN
  Administered 2012-05-30: 09:00:00 10 via INTRAVENOUS

## 2012-05-30 NOTE — Progress Notes (Signed)
Stress Lab Nurses Notes - [name]  Dari D [place] 05/30/2012 Reason for doing test: Chest Pain Type of test: Stress Cardiolite Nurse performing test: Gerrit Halls, RN Nuclear Medicine Tech: Melburn Hake Echo Tech: Not Applicable MD performing test: R. Rothbart & Ermalinda Barrios PA Family MD: DonDiego Test explained and consent signed: yes IV started: 22g jelco, Saline lock flushed, No redness or edema and Saline lock started in radiology Symptoms: SOB & Fatigue Treatment/Intervention: None Reason test stopped: reached target HR After recovery IV was: Discontinued via X-ray tech and No redness or edema Patient to return to [place]. Med at : 12:00 Patient discharged: Home Patient's Condition upon discharge was: stable Comments: During test peak BP 199/81 & HR 144.  Recovery BP 136/77 & HR 90.  Symptoms resolved in recovery. Geanie Cooley T

## 2012-06-05 ENCOUNTER — Ambulatory Visit (INDEPENDENT_AMBULATORY_CARE_PROVIDER_SITE_OTHER): Payer: Medicare Other | Admitting: Cardiology

## 2012-06-05 ENCOUNTER — Encounter: Payer: Self-pay | Admitting: Cardiology

## 2012-06-05 ENCOUNTER — Ambulatory Visit: Payer: Medicare Other | Admitting: Cardiology

## 2012-06-05 VITALS — BP 137/88 | HR 66 | Ht 65.0 in | Wt 207.2 lb

## 2012-06-05 DIAGNOSIS — E785 Hyperlipidemia, unspecified: Secondary | ICD-10-CM

## 2012-06-05 DIAGNOSIS — R002 Palpitations: Secondary | ICD-10-CM

## 2012-06-05 DIAGNOSIS — K219 Gastro-esophageal reflux disease without esophagitis: Secondary | ICD-10-CM

## 2012-06-05 DIAGNOSIS — I10 Essential (primary) hypertension: Secondary | ICD-10-CM

## 2012-06-05 DIAGNOSIS — J811 Chronic pulmonary edema: Secondary | ICD-10-CM

## 2012-06-05 NOTE — Progress Notes (Deleted)
Name: Lindsey Buckley    DOB: 03-Feb-1951  Age: 62 y.o.  MR#: [id number]       PCP:  Maricela Curet, MD      Insurance: Payor: MEDICARE  Plan: MEDICARE PART A AND B  Product Type: *No Product type*    CC:   STRESS TEST 05-30-12 LIST, PCP LABS ENCLOSED   VS Filed Vitals:   06/05/12 1437  BP: 137/88  Pulse: 66  Height: 5\' 5"  (1.651 m)  Weight: 207 lb 4 oz (94.008 kg)    Weights Current Weight  06/05/12 207 lb 4 oz (94.008 kg)  05/16/12 207 lb 12 oz (94.235 kg)  09/13/11 204 lb (92.534 kg)    Blood Pressure  BP Readings from Last 3 Encounters:  06/05/12 137/88  05/16/12 124/78  09/13/11 131/85     Admit date:  (Not on file) Last encounter with RMR:  06/05/2012   Allergy Ace inhibitors and Lisinopril  Current Outpatient Prescriptions  Medication Sig Dispense Refill  . chlorthalidone (HYGROTON) 25 MG tablet TAKE 1/2 TABLET BY MOUTH DAILY.  15 tablet  6  . NIFEdipine (ADALAT CC) 90 MG 24 hr tablet Take 90 mg by mouth daily.        Marland Kitchen olmesartan (BENICAR) 40 MG tablet Take 40 mg by mouth daily.        Marland Kitchen omeprazole (PRILOSEC) 20 MG capsule Take 1 capsule (20 mg total) by mouth 2 (two) times daily.  60 capsule  6  . rosuvastatin (CRESTOR) 40 MG tablet Take 1 tablet (40 mg total) by mouth daily.  90 tablet  3  . TOPROL XL 100 MG 24 hr tablet TAKE ONE TABLET DAILY.  30 each  12  . verapamil (COVERA HS) 240 MG (CO) 24 hr tablet Take 240 mg by mouth at bedtime.         No current facility-administered medications for this visit.    Discontinued Meds:    Medications Discontinued During This Encounter  Medication Reason  . naproxen (NAPROSYN) 500 MG tablet Error    Patient Active Problem List  Diagnosis  . HYPERLIPIDEMIA  . HYPERTENSION  . PULMONARY EDEMA  . GASTROESOPHAGEAL REFLUX DISEASE  . PALPITATIONS  . Chest pain    LABS    Component Value Date/Time   NA 141 12/30/2009 2259   NA 141 06/13/2009   NA 142 08/25/2008 0954   K 4.5 12/30/2009 2259   K 4.4 06/13/2009    K 4.6 08/25/2008 0954   CL 102 12/30/2009 2259   CL 101 06/13/2009   CL 101 08/25/2008 0954   CO2 24 12/30/2009 2259   CO2 29 06/13/2009   CO2 26 08/25/2008 0954   GLUCOSE 86 12/30/2009 2259   GLUCOSE 99 06/13/2009   GLUCOSE 105* 01/03/2008 1635   BUN 22 12/30/2009 2259   BUN 15 06/13/2009   BUN 12 08/25/2008 0954   CREATININE 1.13 12/30/2009 2259   CREATININE 1.00 06/13/2009   CREATININE 1.20 08/25/2008 0954   CALCIUM 9.5 12/30/2009 2259   CALCIUM 9.8 06/13/2009   CALCIUM 10.3 08/25/2008 0954   GFRNONAA 56* 01/03/2008 1635   GFRAA  Value: >60        The eGFR has been calculated using the MDRD equation. This calculation has not been validated in all clinical 01/03/2008 1635   CMP     Component Value Date/Time   NA 141 12/30/2009 2259   K 4.5 12/30/2009 2259   CL 102 12/30/2009 2259   CO2 24 12/30/2009  2259   GLUCOSE 86 12/30/2009 2259   BUN 22 12/30/2009 2259   CREATININE 1.13 12/30/2009 2259   CALCIUM 9.5 12/30/2009 2259   PROT 7.8 12/30/2009 2259   ALBUMIN 4.5 12/30/2009 2259   AST 20 12/30/2009 2259   ALT 17 12/30/2009 2259   ALKPHOS 72 12/30/2009 2259   BILITOT 0.5 12/30/2009 2259   GFRNONAA 56* 01/03/2008 1635   GFRAA  Value: >60        The eGFR has been calculated using the MDRD equation. This calculation has not been validated in all clinical 01/03/2008 1635       Component Value Date/Time   WBC 7.4 08/25/2008 0954   WBC 9.8 01/03/2008 1635   HGB 13.2 08/25/2008 0954   HGB 13.9 01/03/2008 1635   HCT 42.1 08/25/2008 0954   HCT 40.3 01/03/2008 1635   MCV 90.5 08/25/2008 0954   MCV 89.0 01/03/2008 1635    Lipid Panel     Component Value Date/Time   CHOL 189 12/30/2009 2259   TRIG 171* 12/30/2009 2259   HDL 59 12/30/2009 2259   CHOLHDL 3.2 Ratio 12/30/2009 2259   VLDL 34 12/30/2009 2259   LDLCALC 96 12/30/2009 2259   LDLCALC 103 10/23/2008    ABG No results found for this basename: phart, pco2, pco2art, po2, po2art, hco3, tco2, acidbasedef, o2sat     Lab Results  Component Value Date   TSH  1.886 06/13/2009   BNP (last 3 results) No results found for this basename: PROBNP,  in the last 8760 hours Cardiac Panel (last 3 results) No results found for this basename: CKTOTAL, CKMB, TROPONINI, RELINDX,  in the last 72 hours  Iron/TIBC/Ferritin No results found for this basename: iron, tibc, ferritin     EKG Orders placed in visit on 05/16/12  . EKG 12-LEAD     Prior Assessment and Plan Problem List as of 06/05/2012     ICD-9-CM   HYPERLIPIDEMIA   Last Assessment & Plan   05/16/2012 Office Visit Written 05/16/2012 11:59 AM by Imogene Burn, PA     Recent blood work showed increased triglycerides. I spoke to the patient about reducing the sugars in her diet.    HYPERTENSION   Last Assessment & Plan   05/16/2012 Office Visit Written 05/16/2012 11:59 AM by Imogene Burn, PA     Stable    PULMONARY EDEMA   Last Assessment & Plan   03/16/2011 Office Visit Written 03/16/2011  2:21 PM by Lendon Colonel, NP     There is no evidence of fluid overload on clinical exam except for lung congestion. She has not had a CXR to evaluate her congestion, although this is being treated. We will have this completed for further evaluation with copy to Dr. Lorriane Shire.    GASTROESOPHAGEAL REFLUX DISEASE   Last Assessment & Plan   05/16/2012 Office Visit Written 05/16/2012 11:58 AM by Imogene Burn, PA     Patient has had worsening indigestion and belching. I've increased her Prilosec. She may need to see a gastroenterologist.    PALPITATIONS   Last Assessment & Plan   05/16/2012 Office Visit Written 05/16/2012 11:57 AM by Imogene Burn, PA     The patient continues to have help occasions on occasion which she controls by taking an extra Toprol. I told her she can continue to do that.    Chest pain   Last Assessment & Plan   05/16/2012 Office Visit Written 05/16/2012 11:57 AM by  Imogene Burn, PA     Patient had 1-1/2 week history of intermittent chest pain that is sometimes relieved with  nitroglycerin and other times not. She has had excessive belching and heartburn as well. I believe her pain is probably coming from GI origin. I have asked her to increase her Prilosec to 20 mg b.i.d. We will schedule a stress Myoview to rule out ischemia with her prior history. She will follow up with Dr. Lattie Haw after the stress test.        Imaging: Nm Myocar Single W/spect W/wall Motion And Ef  06/03/2012  Ordering Physician: Ermalinda Barrios  Reading Physician: [name]  Clinical Data: 62 year old woman with chest pain but insignificant coronary artery disease and normal EF by cardiac catheterization in 2007.  NUCLEAR MEDICINE STRESS MYOVIEW STUDY WITH SPECT AND LEFT VENTRICULAR EJECTION FRACTION  Radionuclide Data: One-day rest/stress protocol performed with 10/30 mCi of Tc-106m Myoview.  Stress Data: Treadmill exercise performed to a workload of seven mets and a heart rate of 144, 90% of age predicted maximum. Exercise discontinued due to fatigue; no chest discomfort reported. Blood pressure increased from a resting value of 135/75 to 200/80, a borderline hypertensive response. No arrhythmias noted.  EKG: Normal sinus rhythm; delayed R-wave progression; nonspecific T- wave abnormality. Stress EKG:  No significant change.  Scintigraphic Data: Acquisition notable for moderate movement for which software correction was applied.  Moderate breast attenuation was present.  Left ventricular size was normal.  Moderately intense GI activity overlapped to the inferolateral,  inferior and inferoseptal segments.  On  tomographic myocardial images, a minimally sized apical defect of mild to moderate intensity was noted.  Reversibility was present, complete in the antero-apical region and modest inferoapically.  The gated reconstruction demonstrated normal regional and global LV systolic function as well as normal systolic accentuation of activity throughout.  Estimated ejection fraction was 60%.  IMPRESSION:  Probably normal stress nuclear myocardial study revealing impaired exercise capacity, a rapid increase in heart rate at low level exercise suggesting physical deconditioning, a negative stress EKG, normal left ventricular size and normal left ventricular systolic function. By scintigraphic imaging, there was a small apical defect with reversibility, possibly the result of patient movement plus breast attenuation artifact and physiologic apical thinning.  A small degree of apical ischemia cannot be unequivocally excluded. Other findings as noted.   Original Report Authenticated By: [name]

## 2012-06-05 NOTE — Assessment & Plan Note (Addendum)
Patient also monitors blood pressure at home and reports values similar to those obtained today in the office.  Hypertension is adequately controlled, and current medications will be continued.

## 2012-06-05 NOTE — Assessment & Plan Note (Addendum)
Palpitations are relatively infrequent and mild. The patient was offered the option of carrying an event recorder, but prefers to come to the office for a stat EKG should symptoms develop.

## 2012-06-05 NOTE — Patient Instructions (Addendum)
Your physician recommends that you schedule a follow-up appointment in: 1 year  Call our office if you experience palpitations for an ekg

## 2012-06-05 NOTE — Progress Notes (Signed)
Patient ID: Lindsey Buckley, female   DOB: 09-22-1950, 62 y.o.   MRN: [id number]  HPI: Schedule return visit for this lovely woman with a long history of chest discomfort and no known vascular disease. She underwent cardiac catheterization a few years ago in [place] with similar symptoms and was found to have nonobstructive disease. She experiences episodes of left inframammary heaviness occurring once or so per week and lasting 5 minutes. There are no associated symptoms. Episodes have occurred without significant interruption for years. She generally takes nitroglycerin with relief, but sometimes uses antacids. She describes some increase in stress recently related to caring for her 51 year old grandson, but does not believe that she is particularly anxious.  She also reports episodes of palpitations. These occur unpredictably day or night and had been known to awaken her from sleep.  There has been no associated dyspnea, diaphoresis, chest pain or loss of consciousness. Symptoms typically last 15-30 minutes and subside spontaneously. She has worn event recorders in the past, but has never been found to have a specific arrhythmia.  Current Outpatient Prescriptions  Medication Sig Dispense Refill  . chlorthalidone (HYGROTON) 25 MG tablet TAKE 1/2 TABLET BY MOUTH DAILY.  15 tablet  6  . NIFEdipine (ADALAT CC) 90 MG 24 hr tablet Take 90 mg by mouth daily.        Marland Kitchen olmesartan (BENICAR) 40 MG tablet Take 40 mg by mouth daily.        Marland Kitchen omeprazole (PRILOSEC) 20 MG capsule Take 1 capsule (20 mg total) by mouth 2 (two) times daily.  60 capsule  6  . rosuvastatin (CRESTOR) 40 MG tablet Take 1 tablet (40 mg total) by mouth daily.  90 tablet  3  . TOPROL XL 100 MG 24 hr tablet TAKE ONE TABLET DAILY.  30 each  12  . verapamil (COVERA HS) 240 MG (CO) 24 hr tablet Take 240 mg by mouth at bedtime.         No current facility-administered medications for this visit.   Allergies  Allergen Reactions  . Ace  Inhibitors   . Lisinopril      Past medical history, social history, and family history reviewed and updated.  ROS: Denies dyspnea, orthopnea or PND. No lightheadedness nor syncope. All other systems reviewed and are negative.  PHYSICAL EXAM: BP 137/88  Pulse 66  Ht 5\' 5"  (1.651 m)  Wt 94.008 kg (207 lb 4 oz)  BMI 34.49 kg/m2;  Body mass index is 34.49 kg/(m^2). General-Well developed; no acute distress Body habitus-moderately overweight Neck-No JVD; no carotid bruits Lungs-clear lung fields; resonant to percussion; decreased breath sounds at the bases Cardiovascular-normal PMI; normal S1 and S2 Abdomen-normal bowel sounds; soft and non-tender without masses or organomegaly Musculoskeletal-No deformities, no cyanosis or clubbing Neurologic-Normal cranial nerves; symmetric strength and tone Skin-Warm, no significant lesions Extremities-distal pulses intact; no edema  Lindsey Ducking, MD 06/05/2012  3:56 PM  ASSESSMENT AND PLAN

## 2012-06-05 NOTE — Assessment & Plan Note (Signed)
Lipid profile within the past year revealed total and LDL cholesterol of 187/117. While these values are not ideal, she is treated with the highest dose of the most potent statin and has had no significant vascular disease. I do not believe that addition of a second lipid-lowering agent is in her best interests.

## 2012-06-05 NOTE — Assessment & Plan Note (Signed)
Patient's chest discomfort clearly could represent dyspepsia. She is encouraged to continue to use antacids as needed. For fairly mild episodes occurring once per week, I do not believe continuous use of a PPI or H2 blocker is warranted.

## 2012-06-21 ENCOUNTER — Other Ambulatory Visit: Payer: Self-pay | Admitting: *Deleted

## 2012-06-21 DIAGNOSIS — R002 Palpitations: Secondary | ICD-10-CM

## 2012-06-21 DIAGNOSIS — Z0000 Encounter for general adult medical examination without abnormal findings: Secondary | ICD-10-CM

## 2012-06-21 NOTE — Progress Notes (Signed)
All requested lab work was received from Dr Cindie Laroche, with the exception of a CBC, which patient was notified that Dr Lattie Haw would like to have done within the week.  Verbalized understanding.

## 2012-06-29 ENCOUNTER — Encounter: Payer: Self-pay | Admitting: *Deleted

## 2012-07-07 LAB — CBC
HCT: 37.9 % (ref 36.0–46.0)
MCH: 29 pg (ref 26.0–34.0)
MCV: 85.7 fL (ref 78.0–100.0)
RDW: 14.2 % (ref 11.5–15.5)
WBC: 9.2 10*3/uL (ref 4.0–10.5)

## 2012-07-09 ENCOUNTER — Encounter: Payer: Self-pay | Admitting: Cardiology

## 2012-09-04 ENCOUNTER — Ambulatory Visit (HOSPITAL_COMMUNITY)
Admission: RE | Admit: 2012-09-04 | Discharge: 2012-09-04 | Disposition: A | Discharge status: Home / Self Care | Payer: Medicare Other | Source: Ambulatory Visit | Attending: Family Medicine | Admitting: Family Medicine

## 2012-09-04 ENCOUNTER — Other Ambulatory Visit (HOSPITAL_COMMUNITY): Payer: Self-pay | Admitting: Family Medicine

## 2012-09-04 DIAGNOSIS — M1990 Unspecified osteoarthritis, unspecified site: Secondary | ICD-10-CM

## 2012-09-04 DIAGNOSIS — IMO0002 Reserved for concepts with insufficient information to code with codable children: Secondary | ICD-10-CM | POA: Insufficient documentation

## 2012-09-04 DIAGNOSIS — M1710 Unilateral primary osteoarthritis, unspecified knee: Secondary | ICD-10-CM | POA: Insufficient documentation

## 2012-09-04 DIAGNOSIS — M25569 Pain in unspecified knee: Secondary | ICD-10-CM | POA: Insufficient documentation

## 2012-09-17 ENCOUNTER — Encounter (HOSPITAL_COMMUNITY): Payer: Self-pay | Admitting: Cardiology

## 2012-11-23 ENCOUNTER — Other Ambulatory Visit: Payer: Self-pay | Admitting: Adult Health

## 2012-11-23 NOTE — Telephone Encounter (Signed)
Medication sent via escribe for toprol xl

## 2013-03-05 ENCOUNTER — Other Ambulatory Visit: Payer: Self-pay | Admitting: Adult Health

## 2013-05-19 ENCOUNTER — Other Ambulatory Visit: Payer: Self-pay | Admitting: Adult Health

## 2013-06-11 ENCOUNTER — Encounter: Payer: Self-pay | Admitting: Cardiology

## 2013-06-11 ENCOUNTER — Ambulatory Visit (INDEPENDENT_AMBULATORY_CARE_PROVIDER_SITE_OTHER): Payer: Medicare Other | Admitting: Cardiology

## 2013-06-11 VITALS — BP 120/68 | HR 78 | Ht 64.0 in | Wt 210.0 lb

## 2013-06-11 DIAGNOSIS — R002 Palpitations: Secondary | ICD-10-CM

## 2013-06-11 DIAGNOSIS — E785 Hyperlipidemia, unspecified: Secondary | ICD-10-CM

## 2013-06-11 DIAGNOSIS — I10 Essential (primary) hypertension: Secondary | ICD-10-CM

## 2013-06-11 DIAGNOSIS — R079 Chest pain, unspecified: Secondary | ICD-10-CM

## 2013-06-11 MED ORDER — METOPROLOL SUCCINATE ER 100 MG PO TB24: ORAL_TABLET | ORAL | Status: DC

## 2013-06-11 NOTE — Patient Instructions (Addendum)
Your physician wants you to follow-up in: 6 months You will receive a reminder letter in the mail two months in advance. If you don't receive a letter, please call our office to schedule the follow-up appointment.  Your physician has recommended you make the following change in your medication:   INCREASE   Toprolol XL 150 mg daily   We will get your blood work from [place] you for choosing [facility] !

## 2013-06-11 NOTE — Progress Notes (Signed)
Clinical Summary Lindsey Buckley is a 63 y.o.female former patient of Dr Lattie Haw, this is our first visit together. She is seen for the following medical problems.  1. Chest pain - ongoing for several years - prior cath with non-obstructive disease - Exercise MPI 05/2012 with clear evidence of ischemia  - still has chest pain at times. Aching pain under left breast, 5/10. No other associated symptoms. Occurs 1-2 times a week. Nothing makes worst, often better with tums. Can improve with NG as well. Can occur at rest or with exeriton.    2. Palpitations - prior event monitors have not shown evidence of arrythymias.  - still can have palps, occurs approx twice a week. Lasts just a few minutes. No coffee tea, little soda, no EtoH. No other associated symptoms  3. Hyperlipidemia - compliant with crestor, she states pharmacy decreased to 20mg  recently.   4. HTN - compliant with meds - does not check regularly.  Past Medical History  Diagnosis Date  . Pulmonary congestion and hypostasis   . Other and unspecified hyperlipidemia   . Unspecified essential hypertension   . Chest pain     history of  . GERD (gastroesophageal reflux disease)   . Pulmonary edema   . Asthma      Allergies  Allergen Reactions  . Ace Inhibitors   . Lisinopril      Current Outpatient Prescriptions  Medication Sig Dispense Refill  . chlorthalidone (HYGROTON) 25 MG tablet TAKE 1/2 TABLET BY MOUTH EVERY DAY  15 tablet  6  . metoprolol succinate (TOPROL-XL) 100 MG 24 hr tablet TAKE 1 TABLET BY MOUTH EVERY DAY  30 tablet  6  . NIFEdipine (ADALAT CC) 90 MG 24 hr tablet Take 90 mg by mouth daily.        Marland Kitchen olmesartan (BENICAR) 40 MG tablet Take 40 mg by mouth daily.        Marland Kitchen omeprazole (PRILOSEC) 20 MG capsule Take 1 capsule (20 mg total) by mouth 2 (two) times daily.  60 capsule  6  . rosuvastatin (CRESTOR) 40 MG tablet Take 1 tablet (40 mg total) by mouth daily.  90 tablet  3  . verapamil (COVERA HS) 240  MG (CO) 24 hr tablet Take 240 mg by mouth at bedtime.         No current facility-administered medications for this visit.     Past Surgical History  Procedure Laterality Date  . Tubal ligation    . Abdominal hysterectomy    . Colonoscopy       Allergies  Allergen Reactions  . Ace Inhibitors   . Lisinopril       Family History  Problem Relation Age of Onset  . Uterine cancer Sister      Social History Lindsey Buckley reports that she has quit smoking. She has never used smokeless tobacco. Lindsey Buckley reports that she does not drink alcohol.   Review of Systems CONSTITUTIONAL: No weight loss, fever, chills, weakness or fatigue.  HEENT: Eyes: No visual loss, blurred vision, double vision or yellow sclerae.No hearing loss, sneezing, congestion, runny nose or sore throat.  SKIN: No rash or itching.  CARDIOVASCULAR: per HPI RESPIRATORY: No shortness of breath, cough or sputum.  GASTROINTESTINAL: +reflux GENITOURINARY: No burning on urination, no polyuria NEUROLOGICAL: No headache, dizziness, syncope, paralysis, ataxia, numbness or tingling in the extremities. No change in bowel or bladder control.  MUSCULOSKELETAL: No muscle, back pain, joint pain or stiffness.  LYMPHATICS: No enlarged  nodes. No history of splenectomy.  PSYCHIATRIC: No history of depression or anxiety.  ENDOCRINOLOGIC: No reports of sweating, cold or heat intolerance. No polyuria or polydipsia.  Marland Kitchen   Physical Examination p73 bp 109/61 Wt 225 lbs BMI 29 Gen: resting comfortably, no acute distress HEENT: no scleral icterus, pupils equal round and reactive, no palptable cervical adenopathy,  CV: RRR, no m/r/g, no JVD, no carotid bruits Resp: Clear to auscultation bilaterally GI: abdomen is soft, non-tender, non-distended, normal bowel sounds, no hepatosplenomegaly MSK: extremities are warm, no edema.  Skin: warm, no rash Neuro:  no focal deficits Psych: appropriate affect   Diagnostic Studies  05/2012  Stress MPI Stress Data: Treadmill exercise performed to a workload of seven mets and a heart rate of 144, 90% of age predicted maximum. Exercise discontinued due to fatigue; no chest discomfort reported. Blood pressure increased from a resting value of 135/75 to 200/80, a borderline hypertensive response. No arrhythmias noted.  EKG: Normal sinus rhythm; delayed R-wave progression; nonspecific T- wave abnormality. Stress EKG: No significant change.  Scintigraphic Data: Acquisition notable for moderate movement for which software correction was applied. Moderate breast attenuation was present. Left ventricular size was normal. Moderately intense GI activity overlapped to the inferolateral, inferior and inferoseptal segments.  On tomographic myocardial images, a minimally sized apical defect of mild to moderate intensity was noted. Reversibility was present, complete in the antero-apical region and modest inferoapically. The gated reconstruction demonstrated normal regional and global LV systolic function as well as normal systolic accentuation of activity throughout. Estimated ejection fraction was 60%.  IMPRESSION: Probably normal stress nuclear myocardial study revealing impaired exercise capacity, a rapid increase in heart rate at low level exercise suggesting physical deconditioning, a negative stress EKG, normal left ventricular size and normal left ventricular systolic function. By scintigraphic imaging, there was a small apical defect with reversibility, possibly the result of patient movement plus breast attenuation artifact and physiologic apical thinning. A small degree of apical ischemia cannot be unequivocally excluded. Other findings as noted.     06/11/13 Clinic EKG NSR  Assessment and Plan   1. Chest pain - ongoing for several years with negative ischemic evaluations, including recent negative MPI - continue risk factor modification  2. Palpitations - no  arrythmias noted on prior monitors - still having symptoms at times, will try increasing Toprol to 150mg  daily  3. HTN - at goal on current regimen, she has been on both a dihydro and non-dihyrdopyridine, bp is at goal and will continue current regimen  4. Hyperlipidemia - continue current statin, follow up repeat panel   F/u 6 months  Arnoldo Lenis, M.D., F.A.C.C.

## 2013-06-13 ENCOUNTER — Encounter: Payer: Self-pay | Admitting: Cardiology

## 2013-06-18 ENCOUNTER — Other Ambulatory Visit (HOSPITAL_COMMUNITY): Payer: Self-pay | Admitting: Family Medicine

## 2013-06-18 DIAGNOSIS — Z1231 Encounter for screening mammogram for malignant neoplasm of breast: Secondary | ICD-10-CM

## 2013-06-20 ENCOUNTER — Ambulatory Visit (HOSPITAL_COMMUNITY)
Admission: RE | Admit: 2013-06-20 | Discharge: 2013-06-20 | Disposition: A | Discharge status: Home / Self Care | Payer: Medicare Other | Source: Ambulatory Visit | Attending: Family Medicine | Admitting: Family Medicine

## 2013-06-20 DIAGNOSIS — Z1231 Encounter for screening mammogram for malignant neoplasm of breast: Secondary | ICD-10-CM | POA: Insufficient documentation

## 2013-06-25 ENCOUNTER — Other Ambulatory Visit: Payer: Self-pay | Admitting: Family Medicine

## 2013-06-25 DIAGNOSIS — R928 Other abnormal and inconclusive findings on diagnostic imaging of breast: Secondary | ICD-10-CM

## 2013-07-03 ENCOUNTER — Ambulatory Visit (HOSPITAL_COMMUNITY)
Admission: RE | Admit: 2013-07-03 | Discharge: 2013-07-03 | Disposition: A | Discharge status: Home / Self Care | Payer: Medicare Other | Source: Ambulatory Visit | Attending: Family Medicine | Admitting: Family Medicine

## 2013-07-03 DIAGNOSIS — R928 Other abnormal and inconclusive findings on diagnostic imaging of breast: Secondary | ICD-10-CM | POA: Insufficient documentation

## 2013-12-10 ENCOUNTER — Encounter: Payer: Self-pay | Admitting: Cardiology

## 2013-12-10 ENCOUNTER — Ambulatory Visit (INDEPENDENT_AMBULATORY_CARE_PROVIDER_SITE_OTHER): Payer: PRIVATE HEALTH INSURANCE | Admitting: Cardiology

## 2013-12-10 VITALS — BP 122/78 | HR 77 | Ht 64.0 in | Wt 204.0 lb

## 2013-12-10 DIAGNOSIS — I10 Essential (primary) hypertension: Secondary | ICD-10-CM

## 2013-12-10 DIAGNOSIS — R002 Palpitations: Secondary | ICD-10-CM

## 2013-12-10 DIAGNOSIS — R0789 Other chest pain: Secondary | ICD-10-CM

## 2013-12-10 NOTE — Patient Instructions (Signed)
Your physician wants you to follow-up in: 1 year You will receive a reminder letter in the mail two months in advance. If you don't receive a letter, please call our office to schedule the follow-up appointment.    Your physician recommends that you continue on your current medications as directed. Please refer to the Current Medication list given to you today.     Thank you for choosing [facility] !  

## 2013-12-10 NOTE — Progress Notes (Signed)
Clinical Summary [name] is a 63 y.o.female seen today for follow up of the following medical problems.  1. Chest pain  - ongoing for several years  - prior cath with non-obstructive disease  - Exercise MPI 05/2012 with clear evidence of ischemia  - still has chest pain at times. Aching pain under left breast, 5/10. No other associated symptoms. Occurs 1-2 times a week. Nothing makes worst, often better with tums. Can improve with NG as well. Can occur at rest or with exeriton. Often brought on by stress, especially job related.   2. Palpitations  - prior event monitors have not shown evidence of arrythymias.  - still can have palps, occurs approx twice a week. Lasts just a few minutes. No coffee tea, little soda, no EtoH. No other associated symptoms  - overall unchanged since last visit  3. Hyperlipidemia  - compliant with crestor - no recent panel in our system  4. HTN  - compliant with meds  - does not check regularly.     Past Medical History  Diagnosis Date  . Pulmonary congestion and hypostasis   . Other and unspecified hyperlipidemia   . Unspecified essential hypertension   . Chest pain     history of  . GERD (gastroesophageal reflux disease)   . Pulmonary edema   . Asthma      Allergies  Allergen Reactions  . Ace Inhibitors   . Lisinopril      Current Outpatient Prescriptions  Medication Sig Dispense Refill  . chlorthalidone (HYGROTON) 25 MG tablet TAKE 1/2 TABLET BY MOUTH EVERY DAY  15 tablet  6  . metoprolol succinate (TOPROL-XL) 100 MG 24 hr tablet Take 1 1/2 tabs (150 mg) daily Take with or immediately following a meal.  135 tablet  3  . NIFEdipine (ADALAT CC) 90 MG 24 hr tablet Take 90 mg by mouth daily.        Marland Kitchen NITROSTAT 0.4 MG SL tablet       . olmesartan (BENICAR) 40 MG tablet Take 40 mg by mouth daily.        Marland Kitchen omeprazole (PRILOSEC) 20 MG capsule Take 1 capsule (20 mg total) by mouth 2 (two) times daily.  60 capsule  6  . rosuvastatin  (CRESTOR) 40 MG tablet Take 20 mg by mouth daily.      . verapamil (COVERA HS) 240 MG (CO) 24 hr tablet Take 240 mg by mouth at bedtime.         No current facility-administered medications for this visit.     Past Surgical History  Procedure Laterality Date  . Tubal ligation    . Abdominal hysterectomy    . Colonoscopy       Allergies  Allergen Reactions  . Ace Inhibitors   . Lisinopril       Family History  Problem Relation Age of Onset  . Uterine cancer Sister      Social History [name] reports that she has quit smoking. She has never used smokeless tobacco. [name] reports that she does not drink alcohol.   Review of Systems CONSTITUTIONAL: No weight loss, fever, chills, weakness or fatigue.  HEENT: Eyes: No visual loss, blurred vision, double vision or yellow sclerae.No hearing loss, sneezing, congestion, runny nose or sore throat.  SKIN: No rash or itching.  CARDIOVASCULAR: per HPI RESPIRATORY: No shortness of breath, cough or sputum.  GASTROINTESTINAL: No anorexia, nausea, vomiting or diarrhea. No abdominal pain or blood.  GENITOURINARY:  No burning on urination, no polyuria NEUROLOGICAL: No headache, dizziness, syncope, paralysis, ataxia, numbness or tingling in the extremities. No change in bowel or bladder control.  MUSCULOSKELETAL: No muscle, back pain, joint pain or stiffness.  LYMPHATICS: No enlarged nodes. No history of splenectomy.  PSYCHIATRIC: No history of depression or anxiety.  ENDOCRINOLOGIC: No reports of sweating, cold or heat intolerance. No polyuria or polydipsia.  Marland Kitchen   Physical Examination p 77 bp 122/78 Wt 204 lbs BMI 35 Gen: resting comfortably, no acute distress HEENT: no scleral icterus, pupils equal round and reactive, no palptable cervical adenopathy,  CV: RRR, no m/r/g, no JVD, no carotid bruits Resp: Clear to auscultation bilaterally GI: abdomen is soft, non-tender, non-distended, normal bowel sounds, no  hepatosplenomegaly MSK: extremities are warm, no edema.  Skin: warm, no rash Neuro:  no focal deficits Psych: appropriate affect   Diagnostic Studies 05/2012 Stress MPI  Stress Data: Treadmill exercise performed to a workload of seven mets and a heart rate of 144, 90% of age predicted maximum. Exercise discontinued due to fatigue; no chest discomfort reported. Blood pressure increased from a resting value of 135/75 to 200/80, a borderline hypertensive response. No arrhythmias noted.  EKG: Normal sinus rhythm; delayed R-wave progression; nonspecific T- wave abnormality. Stress EKG: No significant change.  Scintigraphic Data: Acquisition notable for moderate movement for which software correction was applied. Moderate breast attenuation was present. Left ventricular size was normal. Moderately intense GI activity overlapped to the inferolateral, inferior and inferoseptal segments.  On tomographic myocardial images, a minimally sized apical defect of mild to moderate intensity was noted. Reversibility was present, complete in the antero-apical region and modest inferoapically. The gated reconstruction demonstrated normal regional and global LV systolic function as well as normal systolic accentuation of activity throughout. Estimated ejection fraction was 60%.  IMPRESSION: Probably normal stress nuclear myocardial study revealing impaired exercise capacity, a rapid increase in heart rate at low level exercise suggesting physical deconditioning, a negative stress EKG, normal left ventricular size and normal left ventricular systolic function. By scintigraphic imaging, there was a small apical defect with reversibility, possibly the result of patient movement plus breast attenuation artifact and physiologic apical thinning. A small degree of apical ischemia cannot be unequivocally excluded. Other findings as noted.    06/11/13 Clinic EKG  NSR     Assessment and Plan  1.  Chest pain  - ongoing for several years with negative ischemic evaluations, including recent negative MPI  - continue risk factor modification   2. Palpitations  - no arrythmias noted on prior monitors  - still having symptoms at times, reports not at a point where she wants to increase her Toprol XL at this time - continue current meds  3. HTN  - at goal on current regimen, continue current meds  4. Hyperlipidemia  - continue current statin, follow up repeat panel from pcp    F/u 1year   Arnoldo Lenis, M.D., F.A.C.C.

## 2013-12-11 ENCOUNTER — Encounter: Payer: Self-pay | Admitting: Cardiology

## 2014-03-07 ENCOUNTER — Other Ambulatory Visit: Payer: Self-pay | Admitting: Adult Health

## 2014-06-15 ENCOUNTER — Other Ambulatory Visit: Payer: Self-pay | Admitting: Cardiology

## 2014-07-28 ENCOUNTER — Emergency Department (HOSPITAL_COMMUNITY)
Admission: EM | Admit: 2014-07-28 | Discharge: 2014-07-28 | Disposition: A | Discharge status: Home / Self Care | Payer: Medicaid Other | Attending: Emergency Medicine | Admitting: Emergency Medicine

## 2014-07-28 ENCOUNTER — Encounter (HOSPITAL_COMMUNITY): Payer: Self-pay | Admitting: *Deleted

## 2014-07-28 ENCOUNTER — Emergency Department (HOSPITAL_COMMUNITY): Payer: Medicaid Other

## 2014-07-28 DIAGNOSIS — K219 Gastro-esophageal reflux disease without esophagitis: Secondary | ICD-10-CM | POA: Insufficient documentation

## 2014-07-28 DIAGNOSIS — Z7952 Long term (current) use of systemic steroids: Secondary | ICD-10-CM | POA: Insufficient documentation

## 2014-07-28 DIAGNOSIS — J45909 Unspecified asthma, uncomplicated: Secondary | ICD-10-CM | POA: Diagnosis not present

## 2014-07-28 DIAGNOSIS — Z79899 Other long term (current) drug therapy: Secondary | ICD-10-CM | POA: Diagnosis not present

## 2014-07-28 DIAGNOSIS — Z87891 Personal history of nicotine dependence: Secondary | ICD-10-CM | POA: Insufficient documentation

## 2014-07-28 DIAGNOSIS — E785 Hyperlipidemia, unspecified: Secondary | ICD-10-CM | POA: Insufficient documentation

## 2014-07-28 DIAGNOSIS — R05 Cough: Secondary | ICD-10-CM | POA: Diagnosis present

## 2014-07-28 DIAGNOSIS — I10 Essential (primary) hypertension: Secondary | ICD-10-CM | POA: Insufficient documentation

## 2014-07-28 DIAGNOSIS — J209 Acute bronchitis, unspecified: Secondary | ICD-10-CM | POA: Insufficient documentation

## 2014-07-28 HISTORY — DX: Hyperlipidemia, unspecified: E78.5

## 2014-07-28 MED ORDER — IPRATROPIUM-ALBUTEROL 0.5-2.5 (3) MG/3ML IN SOLN
3.0000 mL | Freq: Once | RESPIRATORY_TRACT | Status: AC
  Administered 2014-07-28: 3 mL via RESPIRATORY_TRACT
  Filled 2014-07-28: qty 3

## 2014-07-28 MED ORDER — IPRATROPIUM BROMIDE 0.02 % IN SOLN: 0.5000 mg | Freq: Once | RESPIRATORY_TRACT | Status: DC

## 2014-07-28 MED ORDER — PREDNISONE 20 MG PO TABS: ORAL_TABLET | ORAL | Status: DC

## 2014-07-28 MED ORDER — DM-GUAIFENESIN ER 30-600 MG PO TB12: 1.0000 | ORAL_TABLET | Freq: Two times a day (BID) | ORAL | Status: DC | PRN

## 2014-07-28 MED ORDER — ALBUTEROL SULFATE (2.5 MG/3ML) 0.083% IN NEBU: 5.0000 mg | INHALATION_SOLUTION | Freq: Once | RESPIRATORY_TRACT | Status: DC

## 2014-07-28 MED ORDER — PREDNISONE 50 MG PO TABS
60.0000 mg | ORAL_TABLET | Freq: Once | ORAL | Status: AC
  Administered 2014-07-28: 60 mg via ORAL
  Filled 2014-07-28 (×2): qty 1

## 2014-07-28 MED ORDER — ALBUTEROL SULFATE HFA 108 (90 BASE) MCG/ACT IN AERS: 2.0000 | INHALATION_SPRAY | RESPIRATORY_TRACT | Status: DC | PRN

## 2014-07-28 MED ORDER — DM-GUAIFENESIN ER 30-600 MG PO TB12
1.0000 | ORAL_TABLET | Freq: Two times a day (BID) | ORAL | Status: DC
  Administered 2014-07-28: 1 via ORAL
  Filled 2014-07-28: qty 1

## 2014-07-28 MED ORDER — ALBUTEROL SULFATE (2.5 MG/3ML) 0.083% IN NEBU
2.5000 mg | INHALATION_SOLUTION | Freq: Once | RESPIRATORY_TRACT | Status: AC
  Administered 2014-07-28: 2.5 mg via RESPIRATORY_TRACT
  Filled 2014-07-28: qty 3

## 2014-07-28 NOTE — Discharge Instructions (Signed)
Finish the zpak antibiotics. Continue using your inhaler for wheezing, coughing and shortness of breath. Take the mucinex DM for cough. Take the prednisone until gone.  Recheck if you get a fever, struggle to breathe or if your cough isn't getting better in the next 10-14 days.    Laryngitis Laryngitis is redness, soreness, and puffiness (inflammation) of the vocal cords. It causes hoarseness, cough, loss of voice, sore throat, and dry throat. It may be caused by:  Infection.  Too much smoking.  Too much talking or yelling.  Breathing in of toxic fumes.  Allergies.  A backup of acid from your stomach. HOME CARE  Drink enough fluids to keep your pee (urine) clear or pale yellow.  Rest until you no longer have problems or as told by your doctor.  Breathe in moist air.  Take all medicine as told by your doctor.  Do not smoke.  Talk as little as possible (this includes whispering).  Write on paper instead of talking until your voice is back to normal.  Follow up with your doctor if you have not improved after 10 days. GET HELP IF:   You have trouble breathing.  You cough up blood.  You have a fever that will not go away.  You have increasing pain.  You have trouble swallowing. MAKE SURE YOU:  Understand these instructions.  Will watch your condition.  Will get help right away if you are not doing well or get worse. Document Released: 03/10/2011 Document Revised: 06/13/2011 Document Reviewed: 03/10/2011 [facility] Patient Information 2015 [place], [place]. This information is not intended to replace advice given to you by your health care provider. Make sure you discuss any questions you have with your health care provider.  Acute Bronchitis Bronchitis is when the airways that extend from the windpipe into the lungs get red, puffy, and painful (inflamed). Bronchitis often causes thick spit (mucus) to develop. This leads to a cough. A cough is the most common symptom  of bronchitis. In acute bronchitis, the condition usually begins suddenly and goes away over time (usually in 2 weeks). Smoking, allergies, and asthma can make bronchitis worse. Repeated episodes of bronchitis may cause more lung problems. HOME CARE  Rest.  Drink enough fluids to keep your pee (urine) clear or pale yellow (unless you need to limit fluids as told by your doctor).  Only take over-the-counter or prescription medicines as told by your doctor.  Avoid smoking and secondhand smoke. These can make bronchitis worse. If you are a smoker, think about using nicotine gum or skin patches. Quitting smoking will help your lungs heal faster.  Reduce the chance of getting bronchitis again by:  Washing your hands often.  Avoiding people with cold symptoms.  Trying not to touch your hands to your mouth, nose, or eyes.  Follow up with your doctor as told. GET HELP IF: Your symptoms do not improve after 1 week of treatment. Symptoms include:  Cough.  Fever.  Coughing up thick spit.  Body aches.  Chest congestion.  Chills.  Shortness of breath.  Sore throat. GET HELP RIGHT AWAY IF:   You have an increased fever.  You have chills.  You have severe shortness of breath.  You have bloody thick spit (sputum).  You throw up (vomit) often.  You lose too much body fluid (dehydration).  You have a severe headache.  You faint. MAKE SURE YOU:   Understand these instructions.  Will watch your condition.  Will get help right away  if you are not doing well or get worse. Document Released: 09/07/2007 Document Revised: 11/21/2012 Document Reviewed: 09/11/2012 [facility] Patient Information 2015 [place], [place]. This information is not intended to replace advice given to you by your health care provider. Make sure you discuss any questions you have with your health care provider.  Cough, Adult  A cough is a reflex. It helps you clear your throat and airways. A cough can help  heal your body. A cough can last 2 or 3 weeks (acute) or may last more than 8 weeks (chronic). Some common causes of a cough can include an infection, allergy, or a cold. HOME CARE  Only take medicine as told by your doctor.  If given, take your medicines (antibiotics) as told. Finish them even if you start to feel better.  Use a cold steam vaporizer or humidifier in your home. This can help loosen thick spit (secretions).  Sleep so you are almost sitting up (semi-upright). Use pillows to do this. This helps reduce coughing.  Rest as needed.  Stop smoking if you smoke. GET HELP RIGHT AWAY IF:  You have yellowish-white fluid (pus) in your thick spit.  Your cough gets worse.  Your medicine does not reduce coughing, and you are losing sleep.  You cough up blood.  You have trouble breathing.  Your pain gets worse and medicine does not help.  You have a fever. MAKE SURE YOU:   Understand these instructions.  Will watch your condition.  Will get help right away if you are not doing well or get worse. Document Released: 12/02/2010 Document Revised: 08/05/2013 Document Reviewed: 12/02/2010 [facility] Patient Information 2015 [place], [place]. This information is not intended to replace advice given to you by your health care provider. Make sure you discuss any questions you have with your health care provider.

## 2014-07-28 NOTE — ED Notes (Addendum)
Pt reporting that she has had a nonproductive cough for aprox 2 months.  States that she has seen her PCP and was told it was related to allergies.  Pt reports no improvement from Zitromax she was prescribed. Denies SOB or fever. Pt states that the last time she had a cough like this she was diagnosed with pneumonia.

## 2014-07-28 NOTE — ED Provider Notes (Signed)
CSN: [id number]     Arrival date & time 07/28/14  0123 History   First MD Initiated Contact with Patient 07/28/14 0131     Chief Complaint  Patient presents with  . Cough     (Consider location/radiation/quality/duration/timing/severity/associated sxs/prior Treatment) HPI  Patient reports she has had a dry cough without fever for the past 2 months. She states that on Friday, April 22 she became hoarse. She denies sore throat. She states sometimes when she walks she has shortness of breath but not all the time. She denies any swelling in her legs. She denies chest pain. She states she's been having wheezing and her inhaler helps. She called her PCP who called in a prescription for a Z-Pak on April 22 which she has been taking however she states she's not feeling better. She still has 2 days of medication to take. She has been taking Coricidin over-the-counter for her cough and also Halls cough drops. She does not report a prior history of having prolonged bronchitis. 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  PCP Dr. Cindie Laroche  Past Medical History  Diagnosis Date  . Pulmonary congestion and hypostasis   . Other and unspecified hyperlipidemia   . Unspecified essential hypertension   . Chest pain     history of  . GERD (gastroesophageal reflux disease)   . Pulmonary edema   . Asthma   . Hyperlipidemia    Past Surgical History  Procedure Laterality Date  . Tubal ligation    . Abdominal hysterectomy    . Colonoscopy     Family History  Problem Relation Age of Onset  . Uterine cancer Sister    History  Substance Use Topics  . Smoking status: Former Smoker -- 0.40 packs/day    Types: Cigarettes    Quit date: 12/11/1999  . Smokeless tobacco: Never Used  . Alcohol Use: No   Employed in school doing housekeeping  OB History    No data available     Review of Systems  All other systems reviewed and are  negative.     Allergies  Ace inhibitors and Lisinopril  Home Medications   Prior to Admission medications   Medication Sig Start Date End Date Taking? Authorizing Provider  albuterol (PROVENTIL HFA;VENTOLIN HFA) 108 (90 BASE) MCG/ACT inhaler Inhale 2 puffs into the lungs every 4 (four) hours as needed. 07/28/14   Rolland Porter, MD  chlorthalidone (HYGROTON) 25 MG tablet TAKE1/2 TABLET BY MOUTH EVERY DAY    Lendon Colonel, NP  chlorthalidone (HYGROTON) 25 MG tablet TAKE 1/2 TABLET BY MOUTH EVERY DAY 03/07/14   Arnoldo Lenis, MD  dextromethorphan-guaiFENesin [facility] DM) 30-600 MG per 12 hr tablet Take 1 tablet by mouth 2 (two) times daily as needed for cough. 07/28/14   Rolland Porter, MD  metoprolol succinate (TOPROL-XL) 100 MG 24 hr tablet TAKE 1& 1/2 TABLETS BY MOUTH EVERY DAY. TAKE WITH A MEAL OR IMMEDIATELY FOLLOWING A MEAL 06/16/14   Arnoldo Lenis, MD  NIFEdipine (ADALAT CC) 90 MG 24 hr tablet Take 90 mg by mouth daily.      Historical Provider, MD  NITROSTAT 0.4 MG SL tablet  05/11/13   Historical Provider, MD  olmesartan (BENICAR) 40 MG tablet Take 40 mg by mouth daily.      Historical Provider, MD  omeprazole (PRILOSEC) 20 MG capsule Take 1 capsule (20 mg total) by mouth 2 (two)  times daily. 05/16/12   Imogene Burn, PA-C  predniSONE (DELTASONE) 20 MG tablet Take 3 po QD x 3d , then 2 po QD x 3d then 1 po QD x 3d 07/28/14   Rolland Porter, MD  rosuvastatin (CRESTOR) 40 MG tablet Take 20 mg by mouth daily. 05/03/11   Lendon Colonel, NP   BP 153/102 mmHg  Pulse 83  Temp(Src) 98.1 F (36.7 C) (Oral)  Resp 18  Ht 5\' 4"  (1.626 m)  Wt 195 lb (88.451 kg)  BMI 33.46 kg/m2  SpO2 96%  Vital signs normal except for hypertension  Physical Exam  Constitutional: She is oriented to person, place, and time. She appears well-developed and well-nourished.  Non-toxic appearance. She does not appear ill. No distress.  HENT:  Head: Normocephalic and atraumatic.  Right Ear: External ear normal.   Left Ear: External ear normal.  Nose: Nose normal. No mucosal edema or rhinorrhea.  Mouth/Throat: Oropharynx is clear and moist and mucous membranes are normal. No dental abscesses or uvula swelling.  Patient's voice is hoarse  Eyes: Conjunctivae and EOM are normal. Pupils are equal, round, and reactive to light.  Neck: Normal range of motion and full passive range of motion without pain. Neck supple.  Cardiovascular: Normal rate, regular rhythm and normal heart sounds.  Exam reveals no gallop and no friction rub.   No murmur heard. Pulmonary/Chest: Effort normal. No respiratory distress. She has decreased breath sounds. She has no wheezes. She has no rhonchi. She has no rales. She exhibits no tenderness and no crepitus.  Patient has diminished breath sounds without wheezes or rhonchi. She is noted to have long coughing spasms.  Abdominal: Soft. Normal appearance and bowel sounds are normal. She exhibits no distension. There is no tenderness. There is no rebound and no guarding.  Musculoskeletal: Normal range of motion. She exhibits no edema or tenderness.  Moves all extremities well.   Neurological: She is alert and oriented to person, place, and time. She has normal strength. No cranial nerve deficit.  Skin: Skin is warm, dry and intact. No rash noted. No erythema. No pallor.  Psychiatric: She has a normal mood and affect. Her speech is normal and behavior is normal. Her mood appears not anxious.  Nursing note and vitals reviewed.   ED Course  Procedures (including critical care time)  Medications  dextromethorphan-guaiFENesin (MUCINEX DM) 30-600 MG per 12 hr tablet 1 tablet (1 tablet Oral Given 07/28/14 0207)  predniSONE (DELTASONE) tablet 60 mg (60 mg Oral Given 07/28/14 0206)  ipratropium-albuterol (DUONEB) 0.5-2.5 (3) MG/3ML nebulizer solution 3 mL (3 mLs Nebulization Given 07/28/14 0208)  albuterol (PROVENTIL) (2.5 MG/3ML) 0.083% nebulizer solution 2.5 mg (2.5 mg Nebulization Given  07/28/14 0208)    02:30 recheck after her nebulizer, prednisone and Mucinex DM. Patient states her cough is little bit better. On lung exam she has improved air movement and no longer breaks out coughing when she breathes deeply. We are still waiting for her chest x-ray to be read by radiology.  ADL - Ambulation Assistance: Independent (o2 prior to ambulating was 96 % and heart rate was 85. While ambulating pt, pt's o2 ranged between 97-98% and her heart rate was 91. When pt sat back on the bed, she began coughing .)  Labs Review      Imaging Review Dg Chest 2 View  07/28/2014   CLINICAL DATA:  64 year old female with 2 month history of cough  EXAM: CHEST  2 VIEW  COMPARISON:  Prior  chest x-ray 03/16/2011  FINDINGS: Stable cardiomegaly. Atherosclerotic and tortuous thoracic aorta. Mild prominence of the bibasilar interstitial markings and central bronchitic change are similar compared to prior. No focal airspace consolidation, pleural effusion, pulmonary edema or pneumothorax. No suspicious pulmonary nodule. No acute osseous abnormality.  IMPRESSION: Stable chest x-ray without evidence of acute cardiopulmonary process.  Stable cardiomegaly.   Electronically Signed   By: Jacqulynn Cadet M.D.   On: 07/28/2014 03:10     EKG Interpretation None      MDM   Final diagnoses:  Bronchitis, acute, with bronchospasm   New Prescriptions   ALBUTEROL (PROVENTIL HFA;VENTOLIN HFA) 108 (90 BASE) MCG/ACT INHALER    Inhale 2 puffs into the lungs every 4 (four) hours as needed.   DEXTROMETHORPHAN-GUAIFENESIN (MUCINEX DM) 30-600 MG PER 12 HR TABLET    Take 1 tablet by mouth 2 (two) times daily as needed for cough.   PREDNISONE (DELTASONE) 20 MG TABLET    Take 3 po QD x 3d , then 2 po QD x 3d then 1 po QD x 3d    Plan discharge  Rolland Porter, MD, Barbette Or, MD 07/28/14 [phone]

## 2014-12-31 ENCOUNTER — Emergency Department (HOSPITAL_COMMUNITY)
Admission: EM | Admit: 2014-12-31 | Discharge: 2014-12-31 | Disposition: A | Discharge status: Home / Self Care | Payer: Medicaid Other | Attending: Emergency Medicine | Admitting: Emergency Medicine

## 2014-12-31 ENCOUNTER — Encounter (HOSPITAL_COMMUNITY): Payer: Self-pay

## 2014-12-31 ENCOUNTER — Emergency Department (HOSPITAL_COMMUNITY): Payer: Medicaid Other

## 2014-12-31 DIAGNOSIS — I10 Essential (primary) hypertension: Secondary | ICD-10-CM | POA: Diagnosis not present

## 2014-12-31 DIAGNOSIS — Z79899 Other long term (current) drug therapy: Secondary | ICD-10-CM | POA: Diagnosis not present

## 2014-12-31 DIAGNOSIS — Z87891 Personal history of nicotine dependence: Secondary | ICD-10-CM | POA: Insufficient documentation

## 2014-12-31 DIAGNOSIS — J209 Acute bronchitis, unspecified: Secondary | ICD-10-CM | POA: Diagnosis not present

## 2014-12-31 DIAGNOSIS — K219 Gastro-esophageal reflux disease without esophagitis: Secondary | ICD-10-CM | POA: Diagnosis not present

## 2014-12-31 DIAGNOSIS — E785 Hyperlipidemia, unspecified: Secondary | ICD-10-CM | POA: Diagnosis not present

## 2014-12-31 DIAGNOSIS — J45909 Unspecified asthma, uncomplicated: Secondary | ICD-10-CM | POA: Insufficient documentation

## 2014-12-31 DIAGNOSIS — R0981 Nasal congestion: Secondary | ICD-10-CM | POA: Insufficient documentation

## 2014-12-31 DIAGNOSIS — R05 Cough: Secondary | ICD-10-CM | POA: Diagnosis present

## 2014-12-31 NOTE — ED Notes (Signed)
Pt discharged home. Discharge teaching done using teach back method. Pt given opportunity to ask any questions and they were answered. Pt's VSS, pt denies pain, ambulatory upon discharge.

## 2014-12-31 NOTE — Discharge Instructions (Signed)
Try tea and natural honey for cough.  If you were given medicines take as directed.  If you are on coumadin or contraceptives realize their levels and effectiveness is altered by many different medicines.  If you have any reaction (rash, tongues swelling, other) to the medicines stop taking and see a physician.    If your blood pressure was elevated in the ER make sure you follow up for management with a primary doctor or return for chest pain, shortness of breath or stroke symptoms.  Please follow up as directed and return to the ER or see a physician for new or worsening symptoms.  Thank you. Filed Vitals:   12/31/14 0727  BP: 141/87  Pulse: 92  Temp: 98.5 F (36.9 C)  TempSrc: Oral  Resp: 20  Height: 5\' 4"  (1.626 m)  Weight: 194 lb (87.998 kg)  SpO2: 98%

## 2014-12-31 NOTE — ED Notes (Signed)
Pt reports croupy sounding cough x 1 week.   Pt saw pcp yesterday and was started on prednisone and albuterol neb treatments.  Pt reports feelslike has gotten worse.  C/O headache.  Unsure if has had a fever.

## 2014-12-31 NOTE — ED Provider Notes (Signed)
CSN: [id number]     Arrival date & time 12/31/14  [id number] History   First MD Initiated Contact with Patient 12/31/14 [phone]     Chief Complaint  Patient presents with  . Cough     (Consider location/radiation/quality/duration/timing/severity/associated sxs/prior Treatment) HPI Comments: 64 year old female history of high blood pressure, reflux presents with recurrent cough for 1 week. Patient saw primary doctor and was given prednisone and albuterol however no improvement. Nonproductive. No significant sick contacts. No heart failure cardiac history. No chest pain. No recent surgeries. Nothing specifically has improved, intermittent.  Patient is a 64 y.o. female presenting with cough. The history is provided by the patient.  Cough Associated symptoms: no chest pain, no chills, no fever, no headaches, no rash and no shortness of breath     Past Medical History  Diagnosis Date  . Pulmonary congestion and hypostasis   . Other and unspecified hyperlipidemia   . Unspecified essential hypertension   . Chest pain     history of  . GERD (gastroesophageal reflux disease)   . Pulmonary edema   . Asthma   . Hyperlipidemia    Past Surgical History  Procedure Laterality Date  . Tubal ligation    . Abdominal hysterectomy    . Colonoscopy     Family History  Problem Relation Age of Onset  . Uterine cancer Sister    Social History  Substance Use Topics  . Smoking status: Former Smoker -- 0.40 packs/day    Types: Cigarettes    Quit date: 12/11/1999  . Smokeless tobacco: Never Used  . Alcohol Use: No   OB History    No data available     Review of Systems  Constitutional: Negative for fever and chills.  HENT: Positive for congestion.   Eyes: Negative for visual disturbance.  Respiratory: Positive for cough. Negative for shortness of breath.   Cardiovascular: Negative for chest pain.  Gastrointestinal: Negative for vomiting and abdominal pain.  Genitourinary: Negative for dysuria  and flank pain.  Musculoskeletal: Negative for back pain, neck pain and neck stiffness.  Skin: Negative for rash.  Neurological: Negative for light-headedness and headaches.      Allergies  Ace inhibitors and Lisinopril  Home Medications   Prior to Admission medications   Medication Sig Start Date End Date Taking? Authorizing Provider  albuterol (PROVENTIL HFA;VENTOLIN HFA) 108 (90 BASE) MCG/ACT inhaler Inhale 2 puffs into the lungs every 4 (four) hours as needed. 07/28/14   Rolland Porter, MD  chlorthalidone (HYGROTON) 25 MG tablet TAKE1/2 TABLET BY MOUTH EVERY DAY    Lendon Colonel, NP  chlorthalidone (HYGROTON) 25 MG tablet TAKE 1/2 TABLET BY MOUTH EVERY DAY 03/07/14   Arnoldo Lenis, MD  dextromethorphan-guaiFENesin [facility] DM) 30-600 MG per 12 hr tablet Take 1 tablet by mouth 2 (two) times daily as needed for cough. 07/28/14   Rolland Porter, MD  metoprolol succinate (TOPROL-XL) 100 MG 24 hr tablet TAKE 1& 1/2 TABLETS BY MOUTH EVERY DAY. TAKE WITH A MEAL OR IMMEDIATELY FOLLOWING A MEAL 06/16/14   Arnoldo Lenis, MD  NIFEdipine (ADALAT CC) 90 MG 24 hr tablet Take 90 mg by mouth daily.      Historical Provider, MD  NITROSTAT 0.4 MG SL tablet  05/11/13   Historical Provider, MD  olmesartan (BENICAR) 40 MG tablet Take 40 mg by mouth daily.      Historical Provider, MD  omeprazole (PRILOSEC) 20 MG capsule Take 1 capsule (20 mg total) by mouth 2 (two) times  daily. 05/16/12   Imogene Burn, PA-C  predniSONE (DELTASONE) 20 MG tablet Take 3 po QD x 3d , then 2 po QD x 3d then 1 po QD x 3d 07/28/14   Rolland Porter, MD  rosuvastatin (CRESTOR) 40 MG tablet Take 20 mg by mouth daily. 05/03/11   Lendon Colonel, NP   BP 141/87 mmHg  Pulse 92  Temp(Src) 98.5 F (36.9 C) (Oral)  Resp 20  Ht 5\' 4"  (1.626 m)  Wt 194 lb (87.998 kg)  BMI 33.28 kg/m2  SpO2 98% Physical Exam  Constitutional: She is oriented to person, place, and time. She appears well-developed and well-nourished.  HENT:  Head:  Normocephalic and atraumatic.  Congested coughing in the room  Eyes: Right eye exhibits no discharge. Left eye exhibits no discharge.  Neck: Normal range of motion. Neck supple. No tracheal deviation present.  Cardiovascular: Normal rate and regular rhythm.   Pulmonary/Chest: Effort normal and breath sounds normal.  Abdominal: Soft. She exhibits no distension. There is no tenderness. There is no guarding.  Musculoskeletal: She exhibits no edema.  Neurological: She is alert and oriented to person, place, and time.  Skin: Skin is warm. No rash noted.  Psychiatric: She has a normal mood and affect.  Nursing note and vitals reviewed.   ED Course  Procedures (including critical care time) Labs Review Labs Reviewed - No data to display  Imaging Review Dg Chest 2 View  12/31/2014   CLINICAL DATA:  Cough for 1 week.  History of asthma.  EXAM: CHEST  2 VIEW  COMPARISON:  PA and lateral chest 07/28/2014 and 03/16/2011.  FINDINGS: The lungs are clear. Heart size is normal. The aorta is tortuous. No pneumothorax or pleural effusion. No focal bony abnormality. Thoracic spondylosis noted.  IMPRESSION: No acute disease.   Electronically Signed   By: Inge Rise M.D.   On: 12/31/2014 08:00   I have personally reviewed and evaluated these images and lab results as part of my medical decision-making.   EKG Interpretation None      MDM   Final diagnoses:  Acute bronchitis, unspecified organism   Patient presents with clinical concern for bronchitis versus community core pneumonia. With recurrent symptoms plan for chest x-ray for further delineation. No respiratory difficulty and vitals overall unremarkable.  Results and differential diagnosis were discussed with the patient/parent/guardian. Xrays were independently reviewed by myself.  Close follow up outpatient was discussed, comfortable with the plan.   Medications - No data to display  Filed Vitals:   12/31/14 0727  BP: 141/87   Pulse: 92  Temp: 98.5 F (36.9 C)  TempSrc: Oral  Resp: 20  Height: 5\' 4"  (1.626 m)  Weight: 194 lb (87.998 kg)  SpO2: 98%    Final diagnoses:  Acute bronchitis, unspecified organism        Elnora Morrison, MD 12/31/14 [phone]

## 2014-12-31 NOTE — ED Notes (Signed)
Pt c/o "croupy" cough x [redacted] week along with left sided headache and sore throat. Pt was seen by PCP yesterday and was given Prednisone and Albuterol nebulizer. Pt reports that she feels the medications aren't helping and the Albuterol nebulizer made her feel worse.

## 2015-01-13 ENCOUNTER — Ambulatory Visit (INDEPENDENT_AMBULATORY_CARE_PROVIDER_SITE_OTHER): Payer: PRIVATE HEALTH INSURANCE | Admitting: Cardiology

## 2015-01-13 ENCOUNTER — Other Ambulatory Visit (HOSPITAL_COMMUNITY): Payer: Self-pay | Admitting: Family Medicine

## 2015-01-13 ENCOUNTER — Encounter: Payer: Self-pay | Admitting: Cardiology

## 2015-01-13 ENCOUNTER — Other Ambulatory Visit: Payer: Self-pay | Admitting: Neurology

## 2015-01-13 VITALS — BP 130/74 | HR 68 | Ht 64.0 in | Wt 200.8 lb

## 2015-01-13 DIAGNOSIS — R0789 Other chest pain: Secondary | ICD-10-CM | POA: Diagnosis not present

## 2015-01-13 DIAGNOSIS — I10 Essential (primary) hypertension: Secondary | ICD-10-CM

## 2015-01-13 DIAGNOSIS — R002 Palpitations: Secondary | ICD-10-CM | POA: Diagnosis not present

## 2015-01-13 DIAGNOSIS — Z09 Encounter for follow-up examination after completed treatment for conditions other than malignant neoplasm: Secondary | ICD-10-CM

## 2015-01-13 NOTE — Patient Instructions (Addendum)
Medication Instructions:  Your physician recommends that you continue on your current medications as directed. Please refer to the Current Medication list given to you today.  Labwork: None ordered  Testing/Procedures: None ordered  Follow-Up: Your physician wants you to follow-up in: 1 year with Dr. Harl Bowie.  You will receive a reminder letter in the mail two months in advance. If you don't receive a letter, please call our office to schedule the follow-up appointment.  Any Other Special Instructions Will Be Listed Below (If Applicable). We will obtain records from [place].  Thank you for choosing [facility]!!

## 2015-01-13 NOTE — Progress Notes (Signed)
Patient ID: Lindsey Buckley, female   DOB: 01/22/1951, 64 y.o.   MRN: [id number]     Clinical Summary [name] is a 64 y.o.female seen today for follow up of the following medical problems.   1. Chest pain  - ongoing for several years  - prior cath with non-obstructive disease in [place] roughly 10 years ago at [facility] in [place], [place] - Exercise MPI 05/2012 with clear evidence of ischemia  - still has chest pain at times. Aching pain under left breast, 5/10. No other associated symptoms. Occurs 1-2 times a week. Nothing makes worst, often better with tums. Can improve with NG as well. Can occur at rest or with exeriton. Often brought on by stress, especially job related. Symptoms unchanged since last visit  2. Palpitations  - prior event monitors have not shown evidence of arrythymias.  - denies any significant symptoms since last visit. Compliant with her Toprol XL.    3. Hyperlipidemia  - compliant with crestor - no recent panel in our system, has labs with pcp with coming up this week.   4. HTN  - compliant with meds  - does not check at hom Past Medical History  Diagnosis Date  . Pulmonary congestion and hypostasis   . Other and unspecified hyperlipidemia   . Unspecified essential hypertension   . Chest pain     history of  . GERD (gastroesophageal reflux disease)   . Pulmonary edema   . Asthma   . Hyperlipidemia      Allergies  Allergen Reactions  . Ace Inhibitors   . Lisinopril      Current Outpatient Prescriptions  Medication Sig Dispense Refill  . albuterol (PROVENTIL HFA;VENTOLIN HFA) 108 (90 BASE) MCG/ACT inhaler Inhale 2 puffs into the lungs every 4 (four) hours as needed. 6.7 g 0  . chlorthalidone (HYGROTON) 25 MG tablet TAKE1/2 TABLET BY MOUTH EVERY DAY    . chlorthalidone (HYGROTON) 25 MG tablet TAKE 1/2 TABLET BY MOUTH EVERY DAY 15 tablet 9  . dextromethorphan-guaiFENesin (MUCINEX DM) 30-600 MG per 12 hr tablet Take 1 tablet  by mouth 2 (two) times daily as needed for cough. 20 tablet 0  . metoprolol succinate (TOPROL-XL) 100 MG 24 hr tablet TAKE 1& 1/2 TABLETS BY MOUTH EVERY DAY. TAKE WITH A MEAL OR IMMEDIATELY FOLLOWING A MEAL 135 tablet 3  . NIFEdipine (ADALAT CC) 90 MG 24 hr tablet Take 90 mg by mouth daily.      Marland Kitchen NITROSTAT 0.4 MG SL tablet     . olmesartan (BENICAR) 40 MG tablet Take 40 mg by mouth daily.      Marland Kitchen omeprazole (PRILOSEC) 20 MG capsule Take 1 capsule (20 mg total) by mouth 2 (two) times daily. 60 capsule 6  . predniSONE (DELTASONE) 20 MG tablet Take 3 po QD x 3d , then 2 po QD x 3d then 1 po QD x 3d 18 tablet 0  . rosuvastatin (CRESTOR) 40 MG tablet Take 20 mg by mouth daily.     No current facility-administered medications for this visit.     Past Surgical History  Procedure Laterality Date  . Tubal ligation    . Abdominal hysterectomy    . Colonoscopy       Allergies  Allergen Reactions  . Ace Inhibitors   . Lisinopril       Family History  Problem Relation Age of Onset  . Uterine cancer Sister      Social History [name] reports  that she quit smoking about 15 years ago. Her smoking use included Cigarettes. She smoked 0.40 packs per day. She has never used smokeless tobacco. [name] reports that she does not drink alcohol.   Review of Systems CONSTITUTIONAL: No weight loss, fever, chills, weakness or fatigue.  HEENT: Eyes: No visual loss, blurred vision, double vision or yellow sclerae.No hearing loss, sneezing, congestion, runny nose or sore throat.  SKIN: No rash or itching.  CARDIOVASCULAR: per hpi RESPIRATORY: No shortness of breath, cough or sputum.  GASTROINTESTINAL: No anorexia, nausea, vomiting or diarrhea. No abdominal pain or blood.  GENITOURINARY: No burning on urination, no polyuria NEUROLOGICAL: No headache, dizziness, syncope, paralysis, ataxia, numbness or tingling in the extremities. No change in bowel or bladder control.  MUSCULOSKELETAL: No  muscle, back pain, joint pain or stiffness.  LYMPHATICS: No enlarged nodes. No history of splenectomy.  PSYCHIATRIC: No history of depression or anxiety.  ENDOCRINOLOGIC: No reports of sweating, cold or heat intolerance. No polyuria or polydipsia.  Marland Kitchen   Physical Examination Filed Vitals:   01/13/15 1143  BP: 130/74  Pulse: 68   Filed Vitals:   01/13/15 1143  Height: 5\' 4"  (1.626 m)  Weight: 200 lb 12.8 oz (91.082 kg)    Gen: resting comfortably, no acute distress HEENT: no scleral icterus, pupils equal round and reactive, no palptable cervical adenopathy,  CV: RRR, no m/r/g, no jvd Resp: Clear to auscultation bilaterally GI: abdomen is soft, non-tender, non-distended, normal bowel sounds, no hepatosplenomegaly MSK: extremities are warm, no edema.  Skin: warm, no rash Neuro:  no focal deficits Psych: appropriate affect   Diagnostic Studies 05/2012 Stress MPI  Stress Data: Treadmill exercise performed to a workload of seven mets and a heart rate of 144, 90% of age predicted maximum. Exercise discontinued due to fatigue; no chest discomfort reported. Blood pressure increased from a resting value of 135/75 to 200/80, a borderline hypertensive response. No arrhythmias noted.  EKG: Normal sinus rhythm; delayed R-wave progression; nonspecific T- wave abnormality. Stress EKG: No significant change.  Scintigraphic Data: Acquisition notable for moderate movement for which software correction was applied. Moderate breast attenuation was present. Left ventricular size was normal. Moderately intense GI activity overlapped to the inferolateral, inferior and inferoseptal segments.  On tomographic myocardial images, a minimally sized apical defect of mild to moderate intensity was noted. Reversibility was present, complete in the antero-apical region and modest inferoapically. The gated reconstruction demonstrated normal regional and global LV systolic function as well as normal  systolic accentuation of activity throughout. Estimated ejection fraction was 60%.  IMPRESSION: Probably normal stress nuclear myocardial study revealing impaired exercise capacity, a rapid increase in heart rate at low level exercise suggesting physical deconditioning, a negative stress EKG, normal left ventricular size and normal left ventricular systolic function. By scintigraphic imaging, there was a small apical defect with reversibility, possibly the result of patient movement plus breast attenuation artifact and physiologic apical thinning. A small degree of apical ischemia cannot be unequivocally excluded. Other findings as noted.    06/11/13 Clinic EKG  NSR        Assessment and Plan  1. Chest pain  - ongoing for several years with negative ischemic evaluations, symptoms unchanged since last viisit - continue risk factor modification   2. Palpitations  - no arrythmias noted on prior monitors  - no recent symptoms. Continue Toprol  3. HTN  - at goal on current regimen  4. Hyperlipidemia  - continue current statin, has upcoming panel with  pcp    F/u 1 year. Request cath report from [facility].  Arnoldo Lenis, M.D.

## 2015-01-27 ENCOUNTER — Encounter (HOSPITAL_COMMUNITY): Payer: Medicaid Other

## 2015-02-10 ENCOUNTER — Ambulatory Visit (HOSPITAL_COMMUNITY)
Admission: RE | Admit: 2015-02-10 | Discharge: 2015-02-10 | Disposition: A | Discharge status: Home / Self Care | Payer: Medicaid Other | Source: Ambulatory Visit | Attending: Family Medicine | Admitting: Family Medicine

## 2015-02-10 DIAGNOSIS — Z09 Encounter for follow-up examination after completed treatment for conditions other than malignant neoplasm: Secondary | ICD-10-CM

## 2015-02-10 DIAGNOSIS — R921 Mammographic calcification found on diagnostic imaging of breast: Secondary | ICD-10-CM | POA: Diagnosis not present

## 2015-04-25 ENCOUNTER — Other Ambulatory Visit: Payer: Self-pay | Admitting: Cardiology

## 2015-06-25 ENCOUNTER — Other Ambulatory Visit: Payer: Self-pay | Admitting: Cardiology

## 2016-01-20 ENCOUNTER — Ambulatory Visit (INDEPENDENT_AMBULATORY_CARE_PROVIDER_SITE_OTHER): Payer: Medicare Other | Admitting: Cardiology

## 2016-01-20 ENCOUNTER — Encounter: Payer: Self-pay | Admitting: *Deleted

## 2016-01-20 ENCOUNTER — Encounter: Payer: Self-pay | Admitting: Cardiology

## 2016-01-20 VITALS — BP 150/84 | HR 84 | Ht 64.0 in | Wt 195.0 lb

## 2016-01-20 DIAGNOSIS — R079 Chest pain, unspecified: Secondary | ICD-10-CM | POA: Diagnosis not present

## 2016-01-20 DIAGNOSIS — I10 Essential (primary) hypertension: Secondary | ICD-10-CM

## 2016-01-20 DIAGNOSIS — R7309 Other abnormal glucose: Secondary | ICD-10-CM

## 2016-01-20 DIAGNOSIS — E785 Hyperlipidemia, unspecified: Secondary | ICD-10-CM

## 2016-01-20 MED ORDER — NITROGLYCERIN 0.4 MG SL SUBL: 0.4000 mg | SUBLINGUAL_TABLET | SUBLINGUAL | 4 refills | Status: DC | PRN

## 2016-01-20 MED ORDER — LOSARTAN POTASSIUM 50 MG PO TABS: 50.0000 mg | ORAL_TABLET | Freq: Every day | ORAL | 3 refills | Status: DC

## 2016-01-20 NOTE — Patient Instructions (Signed)
Medication Instructions:  STOP BENICAR START LOSARTAN 50 MG DAILY   Labwork: Your physician recommends that you return for lab work in: [place]  Testing/Procedures: Your physician has requested that you have en exercise stress myoview. For further information please visit [email or web address]. Please follow instruction sheet, as given.  Follow-Up: Your physician recommends that you schedule a follow-up appointment in: TO BE DETERMINED AFTER TEST   Any Other Special Instructions Will Be Listed Below (If Applicable).     If you need a refill on your cardiac medications before your next appointment, please call your pharmacy.

## 2016-01-20 NOTE — Progress Notes (Signed)
Clinical Summary Lindsey Buckley is a 65 y.o.female seen today for follow up of the following medical problems.   1. Chest pain  - ongoing for several years  - prior cath with non-obstructive disease in [place] roughly 10 years ago at [facility] in [place], [place] - Exercise MPI 05/2012 without clear evidence of ischemia   - reports ongoing chest pain, increased since last visit.     2. Palpitations  - prior event monitors have not shown evidence of arrythymias.  - no recent symptoms   3. Hyperlipidemia  - compliant with crestor - has labs with pcp with coming up    4. HTN  - compliant with meds  - does not check at home regularly  - she reports HA's since benicar preperation was changed by her pharmacy. She stopped taking and HA's resolved.   Past Medical History:  Diagnosis Date  . Asthma   . Chest pain    history of  . GERD (gastroesophageal reflux disease)   . Hyperlipidemia   . Other and unspecified hyperlipidemia   . Pulmonary congestion and hypostasis   . Pulmonary edema   . Unspecified essential hypertension      Allergies  Allergen Reactions  . Ace Inhibitors   . Lisinopril      Current Outpatient Prescriptions  Medication Sig Dispense Refill  . albuterol (PROVENTIL HFA;VENTOLIN HFA) 108 (90 BASE) MCG/ACT inhaler Inhale 2 puffs into the lungs every 4 (four) hours as needed. 6.7 g 0  . chlorthalidone (HYGROTON) 25 MG tablet TAKE1/2 TABLET BY MOUTH EVERY DAY    . chlorthalidone (HYGROTON) 25 MG tablet TAKE 1/2 TABLET BY MOUTH EVERY DAY 15 tablet 6  . dextromethorphan-guaiFENesin (MUCINEX DM) 30-600 MG per 12 hr tablet Take 1 tablet by mouth 2 (two) times daily as needed for cough. 20 tablet 0  . metoprolol succinate (TOPROL-XL) 100 MG 24 hr tablet TAKE 1 1/2 TABLET BY MOUTH EVERY DAY WITH A MEAL OR IMMEDIATELY FOLLOWING A MEAL 135 tablet 3  . NIFEdipine (ADALAT CC) 90 MG 24 hr tablet Take 90 mg by mouth daily.      Marland Kitchen NITROSTAT  0.4 MG SL tablet     . olmesartan (BENICAR) 40 MG tablet Take 40 mg by mouth daily.      Marland Kitchen omeprazole (PRILOSEC) 20 MG capsule Take 1 capsule (20 mg total) by mouth 2 (two) times daily. 60 capsule 6  . predniSONE (DELTASONE) 20 MG tablet Take 3 po QD x 3d , then 2 po QD x 3d then 1 po QD x 3d (Patient not taking: Reported on 01/13/2015) 18 tablet 0  . rosuvastatin (CRESTOR) 40 MG tablet Take 20 mg by mouth daily.     No current facility-administered medications for this visit.      Past Surgical History:  Procedure Laterality Date  . ABDOMINAL HYSTERECTOMY    . COLONOSCOPY    . TUBAL LIGATION       Allergies  Allergen Reactions  . Ace Inhibitors   . Lisinopril       Family History  Problem Relation Age of Onset  . Uterine cancer Sister      Social History Lindsey Buckley reports that she quit smoking about 16 years ago. Her smoking use included Cigarettes. She smoked 0.40 packs per day. She has never used smokeless tobacco. Lindsey Buckley reports that she does not drink alcohol.   Review of Systems CONSTITUTIONAL: No weight loss, fever, chills, weakness or fatigue.  HEENT: Eyes: No visual loss, blurred vision, double vision or yellow sclerae.No hearing loss, sneezing, congestion, runny nose or sore throat.  SKIN: No rash or itching.  CARDIOVASCULAR: per hpi RESPIRATORY: No shortness of breath, cough or sputum.  GASTROINTESTINAL: No anorexia, nausea, vomiting or diarrhea. No abdominal pain or blood.  GENITOURINARY: No burning on urination, no polyuria NEUROLOGICAL: No headache, dizziness, syncope, paralysis, ataxia, numbness or tingling in the extremities. No change in bowel or bladder control.  MUSCULOSKELETAL: No muscle, back pain, joint pain or stiffness.  LYMPHATICS: No enlarged nodes. No history of splenectomy.  PSYCHIATRIC: No history of depression or anxiety.  ENDOCRINOLOGIC: No reports of sweating, cold or heat intolerance. No polyuria or polydipsia.  Marland Kitchen   Physical  Examination Vitals:   01/20/16 0948  BP: (!) 150/84  Pulse: 84   Vitals:   01/20/16 0948  Weight: 195 lb (88.5 kg)  Height: 5\' 4"  (1.626 m)    Gen: resting comfortably, no acute distress HEENT: no scleral icterus, pupils equal round and reactive, no palptable cervical adenopathy,  CV: RRR, no m/r/g,no jvd Resp: Clear to auscultation bilaterally GI: abdomen is soft, non-tender, non-distended, normal bowel sounds, no hepatosplenomegaly MSK: extremities are warm, no edema.  Skin: warm, no rash Neuro:  no focal deficits Psych: appropriate affect   Diagnostic Studies 05/2012 Stress MPI  Stress Data: Treadmill exercise performed to a workload of seven mets and a heart rate of 144, 90% of age predicted maximum. Exercise discontinued due to fatigue; no chest discomfort reported. Blood pressure increased from a resting value of 135/75 to 200/80, a borderline hypertensive response. No arrhythmias noted.  EKG: Normal sinus rhythm; delayed R-wave progression; nonspecific T- wave abnormality. Stress EKG: No significant change.  Scintigraphic Data: Acquisition notable for moderate movement for which software correction was applied. Moderate breast attenuation was present. Left ventricular size was normal. Moderately intense GI activity overlapped to the inferolateral, inferior and inferoseptal segments.  On tomographic myocardial images, a minimally sized apical defect of mild to moderate intensity was noted. Reversibility was present, complete in the antero-apical region and modest inferoapically. The gated reconstruction demonstrated normal regional and global LV systolic function as well as normal systolic accentuation of activity throughout. Estimated ejection fraction was 60%.  IMPRESSION: Probably normal stress nuclear myocardial study revealing impaired exercise capacity, a rapid increase in heart rate at low level exercise suggesting physical deconditioning, a negative  stress EKG, normal left ventricular size and normal left ventricular systolic function. By scintigraphic imaging, there was a small apical defect with reversibility, possibly the result of patient movement plus breast attenuation artifact and physiologic apical thinning. A small degree of apical ischemia cannot be unequivocally excluded. Other findings as noted.    06/11/13 Clinic EKG  NSR       Assessment and Plan   1. Chest pain  - ongoing for several years. Recent increase in symptoms. Has been a few years since her last ischemic evaluation. Will order exercise nuclear stress test.   2. Palpitations  - no arrythmias noted on prior monitors  - no recent symptoms. Continue to monitor  3. HTN  - at goal. Will stop benicar, start losartan 50mg  daily.   4. Hyperlipidemia  - continue current statin - request labs from pcp    F/u pending stress results   Arnoldo Lenis, M.D

## 2016-01-23 LAB — CBC WITH DIFFERENTIAL/PLATELET
BASOS ABS: 77 {cells}/uL (ref 0–200)
Basophils Relative: 1 %
EOS PCT: 4 %
Eosinophils Absolute: 308 cells/uL (ref 15–500)
HCT: 43.1 % (ref 35.0–45.0)
Hemoglobin: 14.7 g/dL (ref 11.7–15.5)
LYMPHS PCT: 36 %
Lymphs Abs: 2772 cells/uL (ref 850–3900)
MCH: 29.8 pg (ref 27.0–33.0)
MCHC: 34.1 g/dL (ref 32.0–36.0)
MCV: 87.4 fL (ref 80.0–100.0)
MONOS PCT: 10 %
MPV: 9.5 fL (ref 7.5–12.5)
Monocytes Absolute: 770 cells/uL (ref 200–950)
NEUTROS PCT: 49 %
Neutro Abs: 3773 cells/uL (ref 1500–7800)
PLATELETS: 344 10*3/uL (ref 140–400)
RBC: 4.93 MIL/uL (ref 3.80–5.10)
RDW: 13.5 % (ref 11.0–15.0)
WBC: 7.7 10*3/uL (ref 3.8–10.8)

## 2016-01-23 LAB — LIPID PANEL
CHOL/HDL RATIO: 2.9 ratio (ref <=5.0)
Cholesterol: 193 mg/dL (ref 125–200)
HDL: 66 mg/dL (ref >=46)
LDL CALC: 102 mg/dL (ref <130)
TRIGLYCERIDES: 125 mg/dL (ref <150)
VLDL: 25 mg/dL (ref <30)

## 2016-01-23 LAB — HEMOGLOBIN A1C
Hgb A1c MFr Bld: 6 % — ABNORMAL HIGH (ref <5.7)
MEAN PLASMA GLUCOSE: 126 mg/dL

## 2016-01-23 LAB — COMPREHENSIVE METABOLIC PANEL
ALT: 35 U/L — ABNORMAL HIGH (ref 6–29)
AST: 32 U/L (ref 10–35)
Albumin: 4.4 g/dL (ref 3.6–5.1)
Alkaline Phosphatase: 70 U/L (ref 33–130)
BUN: 11 mg/dL (ref 7–25)
CHLORIDE: 101 mmol/L (ref 98–110)
CO2: 28 mmol/L (ref 20–31)
CREATININE: 0.92 mg/dL (ref 0.50–0.99)
Calcium: 9.6 mg/dL (ref 8.6–10.4)
Glucose, Bld: 103 mg/dL — ABNORMAL HIGH (ref 65–99)
Potassium: 3.9 mmol/L (ref 3.5–5.3)
SODIUM: 139 mmol/L (ref 135–146)
Total Bilirubin: 0.6 mg/dL (ref 0.2–1.2)
Total Protein: 7.6 g/dL (ref 6.1–8.1)

## 2016-01-24 LAB — MAGNESIUM: MAGNESIUM: 2.1 mg/dL (ref 1.5–2.5)

## 2016-01-29 ENCOUNTER — Encounter (HOSPITAL_COMMUNITY)
Admission: RE | Admit: 2016-01-29 | Discharge: 2016-01-29 | Disposition: A | Discharge status: Home / Self Care | Payer: Medicare Other | Source: Ambulatory Visit | Attending: Cardiology | Admitting: Cardiology

## 2016-01-29 ENCOUNTER — Inpatient Hospital Stay (HOSPITAL_COMMUNITY): Admission: RE | Admit: 2016-01-29 | Payer: PRIVATE HEALTH INSURANCE | Source: Ambulatory Visit

## 2016-01-29 ENCOUNTER — Encounter (HOSPITAL_COMMUNITY): Payer: Self-pay

## 2016-01-29 ENCOUNTER — Other Ambulatory Visit (HOSPITAL_COMMUNITY): Payer: Self-pay | Admitting: Family Medicine

## 2016-01-29 DIAGNOSIS — Z1231 Encounter for screening mammogram for malignant neoplasm of breast: Secondary | ICD-10-CM

## 2016-01-29 DIAGNOSIS — R079 Chest pain, unspecified: Secondary | ICD-10-CM | POA: Diagnosis not present

## 2016-01-29 LAB — NM MYOCAR MULTI W/SPECT W/WALL MOTION / EF
CHL CUP NUCLEAR SRS: 3
CHL RATE OF PERCEIVED EXERTION: 12
CSEPEW: 7 METS
Exercise duration (min): 4 min
Exercise duration (sec): 13 s
LV dias vol: 71 mL (ref 46–106)
LVSYSVOL: 31 mL
MPHR: 156 {beats}/min
NUC STRESS TID: 0.81
Peak HR: 131 {beats}/min
Percent HR: 83 %
RATE: 0
Rest HR: 62 {beats}/min
SDS: 2
SSS: 5

## 2016-01-29 MED ORDER — TECHNETIUM TC 99M TETROFOSMIN IV KIT
30.0000 | PACK | Freq: Once | INTRAVENOUS | Status: AC | PRN
  Administered 2016-01-29: 30.8 via INTRAVENOUS

## 2016-01-29 MED ORDER — REGADENOSON 0.4 MG/5ML IV SOLN
INTRAVENOUS | Status: AC
  Administered 2016-01-29: 0.4 mg via INTRAVENOUS
  Filled 2016-01-29: qty 5

## 2016-01-29 MED ORDER — TECHNETIUM TC 99M TETROFOSMIN IV KIT
10.0000 | PACK | Freq: Once | INTRAVENOUS | Status: AC | PRN
  Administered 2016-01-29: 10 via INTRAVENOUS

## 2016-01-29 MED ORDER — SODIUM CHLORIDE 0.9% FLUSH
INTRAVENOUS | Status: AC
  Administered 2016-01-29: 10 mL via INTRAVENOUS
  Filled 2016-01-29: qty 10

## 2016-02-12 ENCOUNTER — Ambulatory Visit (HOSPITAL_COMMUNITY)
Admission: RE | Admit: 2016-02-12 | Discharge: 2016-02-12 | Disposition: A | Discharge status: Home / Self Care | Payer: Medicare Other | Source: Ambulatory Visit | Attending: Family Medicine | Admitting: Family Medicine

## 2016-02-12 DIAGNOSIS — Z1231 Encounter for screening mammogram for malignant neoplasm of breast: Secondary | ICD-10-CM

## 2016-03-07 ENCOUNTER — Encounter: Payer: Self-pay | Admitting: Cardiology

## 2016-03-07 ENCOUNTER — Ambulatory Visit (INDEPENDENT_AMBULATORY_CARE_PROVIDER_SITE_OTHER): Payer: Medicare Other | Admitting: Cardiology

## 2016-03-07 VITALS — BP 138/89 | HR 67 | Ht 64.0 in | Wt 198.0 lb

## 2016-03-07 DIAGNOSIS — R0789 Other chest pain: Secondary | ICD-10-CM

## 2016-03-07 MED ORDER — METOPROLOL SUCCINATE ER 100 MG PO TB24: 100.0000 mg | ORAL_TABLET | Freq: Every day | ORAL | 6 refills | Status: DC

## 2016-03-07 NOTE — Progress Notes (Signed)
Clinical Summary Lindsey Buckley is a 65 y.o.female seen today for follow up of the following medical problems. This is a focused visit on recent symptoms of chest pain.   1. Chest pain  - ongoing for several years  - prior cath with non-obstructive disease in [place] roughly 10 years ago at [facility] in [place], [place] - Exercise MPI 05/2012 without clear evidence of ischemia    - since last visit 01/2016 completed nuclear stress test, no ischemia.  - symptoms still at times. She increased Toprol to 200mg  daily on her own, symptoms have improved. Most recent symptoms most consistent with palpitations.  - told not to take asprin during previous admission she reports     Past Medical History:  Diagnosis Date  . Asthma   . Chest pain    history of  . GERD (gastroesophageal reflux disease)   . Hyperlipidemia   . Other and unspecified hyperlipidemia   . Pulmonary congestion and hypostasis   . Pulmonary edema   . Unspecified essential hypertension      Allergies  Allergen Reactions  . Ace Inhibitors   . Lisinopril      Current Outpatient Prescriptions  Medication Sig Dispense Refill  . albuterol (PROVENTIL HFA;VENTOLIN HFA) 108 (90 BASE) MCG/ACT inhaler Inhale 2 puffs into the lungs every 4 (four) hours as needed. 6.7 g 0  . chlorthalidone (HYGROTON) 25 MG tablet TAKE1/2 TABLET BY MOUTH EVERY DAY    . chlorthalidone (HYGROTON) 25 MG tablet TAKE 1/2 TABLET BY MOUTH EVERY DAY 15 tablet 6  . losartan (COZAAR) 50 MG tablet Take 1 tablet (50 mg total) by mouth daily. 90 tablet 3  . metoprolol succinate (TOPROL-XL) 100 MG 24 hr tablet TAKE 1 1/2 TABLET BY MOUTH EVERY DAY WITH A MEAL OR IMMEDIATELY FOLLOWING A MEAL 135 tablet 3  . NIFEdipine (ADALAT CC) 90 MG 24 hr tablet Take 90 mg by mouth daily.      . nitroGLYCERIN (NITROSTAT) 0.4 MG SL tablet Place 1 tablet (0.4 mg total) under the tongue every 5 (five) minutes as needed for chest pain. 25 tablet 4  .  omeprazole (PRILOSEC) 20 MG capsule Take 1 capsule (20 mg total) by mouth 2 (two) times daily. 60 capsule 6  . rosuvastatin (CRESTOR) 40 MG tablet Take 20 mg by mouth daily.     No current facility-administered medications for this visit.      Past Surgical History:  Procedure Laterality Date  . ABDOMINAL HYSTERECTOMY    . COLONOSCOPY    . TUBAL LIGATION       Allergies  Allergen Reactions  . Ace Inhibitors   . Lisinopril       Family History  Problem Relation Age of Onset  . Uterine cancer Sister      Social History Lindsey Buckley reports that she quit smoking about 16 years ago. Her smoking use included Cigarettes. She smoked 0.40 packs per day. She has never used smokeless tobacco. Lindsey Buckley reports that she does not drink alcohol.   Review of Systems CONSTITUTIONAL: No weight loss, fever, chills, weakness or fatigue.  HEENT: Eyes: No visual loss, blurred vision, double vision or yellow sclerae.No hearing loss, sneezing, congestion, runny nose or sore throat.  SKIN: No rash or itching.  CARDIOVASCULAR: per hpi RESPIRATORY: No shortness of breath, cough or sputum.  GASTROINTESTINAL: No anorexia, nausea, vomiting or diarrhea. No abdominal pain or blood.  GENITOURINARY: No burning on urination, no polyuria NEUROLOGICAL: No headache, dizziness,  syncope, paralysis, ataxia, numbness or tingling in the extremities. No change in bowel or bladder control.  MUSCULOSKELETAL: No muscle, back pain, joint pain or stiffness.  LYMPHATICS: No enlarged nodes. No history of splenectomy.  PSYCHIATRIC: No history of depression or anxiety.  ENDOCRINOLOGIC: No reports of sweating, cold or heat intolerance. No polyuria or polydipsia.  Marland Kitchen   Physical Examination Vitals:   03/07/16 1435  BP: 138/89  Pulse: 67   Vitals:   03/07/16 1435  Weight: 198 lb (89.8 kg)  Height: 5\' 4"  (1.626 m)    Gen: resting comfortably, no acute distress HEENT: no scleral icterus, pupils equal round and  reactive, no palptable cervical adenopathy,  CV: RRR, no m/r/g, no jvd Resp: Clear to auscultation bilaterally GI: abdomen is soft, non-tender, non-distended, normal bowel sounds, no hepatosplenomegaly MSK: extremities are warm, no edema.  Skin: warm, no rash Neuro:  no focal deficits Psych: appropriate affect   Diagnostic Studies 05/2012 Stress MPI Stress Data: Treadmill exercise performed to a workload of seven mets and a heart rate of 144, 90% of age predicted maximum. Exercise discontinued due to fatigue; no chest discomfort reported. Blood pressure increased from a resting value of 135/75 to 200/80, a borderline hypertensive response. No arrhythmias noted.  EKG: Normal sinus rhythm; delayed R-wave progression; nonspecific T- wave abnormality. Stress EKG: No significant change.  Scintigraphic Data: Acquisition notable for moderate movement for which software correction was applied. Moderate breast attenuation was present. Left ventricular size was normal. Moderately intense GI activity overlapped to the inferolateral, inferior and inferoseptal segments.  On tomographic myocardial images, a minimally sized apical defect of mild to moderate intensity was noted. Reversibility was present, complete in the antero-apical region and modest inferoapically. The gated reconstruction demonstrated normal regional and global LV systolic function as well as normal systolic accentuation of activity throughout. Estimated ejection fraction was 60%.  IMPRESSION: Probably normal stress nuclear myocardial study revealing impaired exercise capacity, a rapid increase in heart rate at low level exercise suggesting physical deconditioning, a negative stress EKG, normal left ventricular size and normal left ventricular systolic function. By scintigraphic imaging, there was a small apical defect with reversibility, possibly the result of patient movement plus breast attenuation artifact and  physiologic apical thinning. A small degree of apical ischemia cannot be unequivocally excluded. Other findings as noted.   06/11/13 Clinic EKG NSR   01/2016 Nuclear stress test  Inadequate heart rate response on treadmill, took Lopressor this morning. Switched to [employer] study. No diagnostic ST segment changes.  Small, mild intensity, apical anteroseptal and basal inferolateral defects that are fixed and consistent with soft tissue attenuation rather than scar with normal wall motion in these regions. No definitive ischemia.  This is a low risk study.  Nuclear stress EF: 57%.   Assessment and Plan  1. Chest pain  - ongoing for several years. Recent stress test without significant ischemia - continue current meds, continue to monitor at this time.       Arnoldo Lenis, M.D.

## 2016-03-07 NOTE — Patient Instructions (Signed)
Your physician wants you to follow-up in: 6 Months with Dr. Harl Bowie. You will receive a reminder letter in the mail two months in advance. If you don't receive a letter, please call our office to schedule the follow-up appointment.  Your physician has recommended you make the following change in your medication:  Increase Toprol XL to 100 mg Two Times Daily   If you need a refill on your cardiac medications before your next appointment, please call your pharmacy.  Thank you for choosing [facility]!

## 2016-04-13 ENCOUNTER — Other Ambulatory Visit: Payer: Self-pay | Admitting: Cardiology

## 2016-07-21 ENCOUNTER — Other Ambulatory Visit: Payer: Self-pay | Admitting: Cardiology

## 2017-01-02 ENCOUNTER — Other Ambulatory Visit: Payer: Self-pay

## 2017-01-02 ENCOUNTER — Other Ambulatory Visit: Payer: Self-pay | Admitting: *Deleted

## 2017-01-02 MED ORDER — LOSARTAN POTASSIUM 50 MG PO TABS: 50.0000 mg | ORAL_TABLET | Freq: Every day | ORAL | 0 refills | Status: DC

## 2017-01-02 NOTE — Telephone Encounter (Signed)
Pt needs apt for refills, lm with pharmacy to have pt call us

## 2017-02-21 ENCOUNTER — Telehealth: Payer: Self-pay | Admitting: Cardiology

## 2017-02-21 ENCOUNTER — Other Ambulatory Visit: Payer: Self-pay

## 2017-02-21 MED ORDER — LOSARTAN POTASSIUM 50 MG PO TABS: 50.0000 mg | ORAL_TABLET | Freq: Every day | ORAL | 0 refills | Status: DC

## 2017-02-21 NOTE — Telephone Encounter (Signed)
Pt would like to know what she should do about the refills for her losartan (COZAAR) 50 MG tablet [[id number]]

## 2017-02-21 NOTE — Telephone Encounter (Signed)
Sent in refills for Losartan to make it to follow-up.

## 2017-03-03 NOTE — Progress Notes (Signed)
Cardiology Office Note   Date:  03/06/2017   ID:  Lindsey Buckley, DOB 05/09/1950, MRN [id number]  PCP:  Lucia Gaskins, MD  Cardiologist: Carlyle Dolly, MD  Chief Complaint  Patient presents with  . Hypertension  . Hyperlipidemia     History of Present Illness: Lindsey Buckley is a 66 y.o. female who presents for ongoing assessment and management of chronic chest pain, with history of nonobstructive coronary disease in 2007 per catheterization in [place], the patient had a follow-up nuclear medicine study in 2014 without evidence of ischemia.  Other history includes hypertension, hyperlipidemia, and GERD.  She has not been seen in our office since December 2017.  She comes today having not taken chlorthalidone for 2 months, she is also stopped taking losartan as she had no refills and was advised to come see our practice.  She was told the losartan needed to be reevaluated concerning recall.  Review of recent augmentation on this, only includes losartan HCTZ, and not losartan by itself.  Past Medical History:  Diagnosis Date  . Asthma   . Chest pain    history of  . GERD (gastroesophageal reflux disease)   . Hyperlipidemia   . Other and unspecified hyperlipidemia   . Pulmonary congestion and hypostasis   . Pulmonary edema   . Unspecified essential hypertension     Past Surgical History:  Procedure Laterality Date  . ABDOMINAL HYSTERECTOMY    . COLONOSCOPY    . TUBAL LIGATION       Current Outpatient Medications  Medication Sig Dispense Refill  . albuterol (PROVENTIL HFA;VENTOLIN HFA) 108 (90 BASE) MCG/ACT inhaler Inhale 2 puffs into the lungs every 4 (four) hours as needed. 6.7 g 0  . chlorthalidone (HYGROTON) 25 MG tablet TAKE1/2 TABLET BY MOUTH EVERY DAY    . FARXIGA 10 MG TABS tablet TK 1 T PO QD  5  . losartan (COZAAR) 50 MG tablet Take 1 tablet (50 mg total) by mouth daily. 30 tablet 0  . metoprolol succinate (TOPROL-XL) 100 MG 24 hr tablet Take 1  tablet (100 mg total) by mouth daily. 60 tablet 6  . metoprolol succinate (TOPROL-XL) 100 MG 24 hr tablet Take 1 tablet (100 mg total) by mouth 2 (two) times daily. 180 tablet 3  . NIFEdipine (ADALAT CC) 90 MG 24 hr tablet Take 90 mg by mouth daily.      . nitroGLYCERIN (NITROSTAT) 0.4 MG SL tablet Place 1 tablet (0.4 mg total) under the tongue every 5 (five) minutes as needed for chest pain. 25 tablet 4  . omeprazole (PRILOSEC) 20 MG capsule Take 1 capsule (20 mg total) by mouth 2 (two) times daily. 60 capsule 6  . rosuvastatin (CRESTOR) 40 MG tablet Take 20 mg by mouth daily.     No current facility-administered medications for this visit.     Allergies:   Ace inhibitors and Lisinopril    Social History:  The patient  reports that she quit smoking about 17 years ago. Her smoking use included cigarettes. She smoked 0.40 packs per day. she has never used smokeless tobacco. She reports that she does not drink alcohol or use drugs.   Family History:  The patient's family history includes Uterine cancer in her sister.    ROS: All other systems are reviewed and negative. Unless otherwise mentioned in H&P    PHYSICAL EXAM: VS:  BP 140/80 (BP Location: Right Arm)   Pulse 76   Ht 5' 4"  (1.626 m)  Wt 198 lb (89.8 kg)   SpO2 90%   BMI 33.99 kg/m  , BMI Body mass index is 33.99 kg/m. GEN: Well nourished, well developed, in no acute distress  HEENT: normal  Neck: no JVD, carotid bruits, or masses Cardiac: RRR; no murmurs, rubs, or gallops,no edema  Respiratory:  clear to auscultation bilaterally, normal work of breathing GI: soft, nontender, nondistended, + BS MS: no deformity or atrophy  Skin: warm and dry, no rash Neuro:  Strength and sensation are intact Psych: euthymic mood, full affect   EKG: Normal sinus rhythm, nonspecific T wave abnormality, heart rate of 74 bpm.  Some motion artifact is noted.  Recent Labs: No results found for requested labs within last 8760 hours.     Lipid Panel    Component Value Date/Time   CHOL 193 01/23/2016 0812   TRIG 125 01/23/2016 0812   TRIG 78 10/23/2008   HDL 66 01/23/2016 0812   CHOLHDL 2.9 01/23/2016 0812   VLDL 25 01/23/2016 0812   LDLCALC 102 01/23/2016 0812   LDLCALC 103 10/23/2008      Wt Readings from Last 3 Encounters:  03/06/17 198 lb (89.8 kg)  03/07/16 198 lb (89.8 kg)  01/20/16 195 lb (88.5 kg)      Other studies Reviewed: None    ASSESSMENT AND PLAN:  1.  Hypertension: The patient has not been compliant with chlorthalidone.  She states that it was too small to cut in half and therefore she chose not to take it.  Patient also has run out of losartan, she was concerned about recall, and advised by her pharmacy to speak with cardiology for refills.  Blood pressure today is not well controlled in the setting of diabetes and JNC standards.  We will restart her on losartan 25 mg daily with goal of blood pressure between 825 and 003 systolic over 70-48.  I will not restart chlorthalidone as she refuses to take it.  May consider adding HCTZ 12.5 mg alone if blood pressure is not responsive to medications.  In the interim we will check labs to be met, CBC, hemoglobin A1c, and magnesium.  Patient will return to the office for full office visit in 6 months.  Is followed by primary care and by visiting.  2. CAD: Stent to unknown artery.  Ongoing cardiovascular risk factors of diabetes, hypertension, hypercholesterolemia.  She is currently asymptomatic.  No ischemic testing is planned and less she becomes more fatigued or having new symptoms.  Now getting blood pressure under control his priority.  3.  Diabetes: By primary care physician Dr. Cindie Laroche  4.  Hypercholesterolemia: Continue statin therapy.  Current medicines are reviewed at length with the patient today.    Labs/ tests ordered today include: BMET, CBC, hemoglobin A1c, and magnesium with results to primary care. Phill Myron. West Pugh, ANP,  AACC   03/06/2017 4:02 PM    [facility] [address], [place], [place] [address] Phone: [phone]; Fax: [phone]

## 2017-03-06 ENCOUNTER — Encounter: Payer: Self-pay | Admitting: Adult Health

## 2017-03-06 ENCOUNTER — Ambulatory Visit (INDEPENDENT_AMBULATORY_CARE_PROVIDER_SITE_OTHER): Payer: Medicare Other | Admitting: Adult Health

## 2017-03-06 VITALS — BP 140/80 | HR 76 | Ht 64.0 in | Wt 198.0 lb

## 2017-03-06 DIAGNOSIS — Z79899 Other long term (current) drug therapy: Secondary | ICD-10-CM | POA: Diagnosis not present

## 2017-03-06 DIAGNOSIS — I2510 Atherosclerotic heart disease of native coronary artery without angina pectoris: Secondary | ICD-10-CM

## 2017-03-06 DIAGNOSIS — E7800 Pure hypercholesterolemia, unspecified: Secondary | ICD-10-CM

## 2017-03-06 DIAGNOSIS — I10 Essential (primary) hypertension: Secondary | ICD-10-CM | POA: Diagnosis not present

## 2017-03-06 MED ORDER — LOSARTAN POTASSIUM 25 MG PO TABS: 25.0000 mg | ORAL_TABLET | Freq: Every day | ORAL | 3 refills | Status: DC

## 2017-03-06 NOTE — Patient Instructions (Signed)
Medication Instructions:  Your physician has recommended you make the following change in your medication: Start Losartan 25 mg Daily    Labwork: Your physician recommends that you return for lab work in: Today    Testing/Procedures: NONE   Follow-Up: Your physician wants you to follow-up in: 6 Months. You will receive a reminder letter in the mail two months in advance. If you don't receive a letter, please call our office to schedule the follow-up appointment.   Any Other Special Instructions Will Be Listed Below (If Applicable).     If you need a refill on your cardiac medications before your next appointment, please call your pharmacy.  Thank you for choosing [facility]!

## 2017-04-03 ENCOUNTER — Other Ambulatory Visit: Payer: Self-pay | Admitting: Adult Health

## 2017-04-03 ENCOUNTER — Other Ambulatory Visit: Payer: Self-pay | Admitting: *Deleted

## 2017-04-03 MED ORDER — METOPROLOL SUCCINATE ER 100 MG PO TB24
100.0000 mg | ORAL_TABLET | Freq: Two times a day (BID) | ORAL | 3 refills | Status: DC
Start: 2017-04-03 — End: 2018-04-23

## 2017-04-03 NOTE — Telephone Encounter (Signed)
Per 03/07/16 OV with Dr.Branch, pt's metoprolol succinate was increase to 100 mg BID, e-scribed refill to [facility]

## 2017-04-04 LAB — MAGNESIUM: MAGNESIUM: 2.1 mg/dL (ref 1.6–2.3)

## 2017-04-04 LAB — CBC/DIFF AMBIGUOUS DEFAULT
BASOS: 0 %
Basophils Absolute: 0 10*3/uL (ref 0.0–0.2)
EOS (ABSOLUTE): 0.2 10*3/uL (ref 0.0–0.4)
Eos: 3 %
Hematocrit: 43.4 % (ref 34.0–46.6)
Hemoglobin: 14.3 g/dL (ref 11.1–15.9)
IMMATURE GRANS (ABS): 0 10*3/uL (ref 0.0–0.1)
IMMATURE GRANULOCYTES: 0 %
LYMPHS: 27 %
Lymphocytes Absolute: 2.1 10*3/uL (ref 0.7–3.1)
MCH: 29.1 pg (ref 26.6–33.0)
MCHC: 32.9 g/dL (ref 31.5–35.7)
MCV: 88 fL (ref 79–97)
MONOS ABS: 0.8 10*3/uL (ref 0.1–0.9)
Monocytes: 10 %
NEUTROS PCT: 60 %
Neutrophils Absolute: 4.5 10*3/uL (ref 1.4–7.0)
PLATELETS: 338 10*3/uL (ref 150–379)
RBC: 4.91 x10E6/uL (ref 3.77–5.28)
RDW: 13.3 % (ref 12.3–15.4)
WBC: 7.6 10*3/uL (ref 3.4–10.8)

## 2017-04-04 LAB — BASIC METABOLIC PANEL
BUN/Creatinine Ratio: 11 — ABNORMAL LOW (ref 12–28)
BUN: 12 mg/dL (ref 8–27)
CALCIUM: 10.1 mg/dL (ref 8.7–10.3)
CHLORIDE: 100 mmol/L (ref 96–106)
CO2: 25 mmol/L (ref 20–29)
Creatinine, Ser: 1.06 mg/dL — ABNORMAL HIGH (ref 0.57–1.00)
GFR calc Af Amer: 64 mL/min/{1.73_m2} (ref >59)
GFR calc non Af Amer: 55 mL/min/{1.73_m2} — ABNORMAL LOW (ref >59)
GLUCOSE: 90 mg/dL (ref 65–99)
POTASSIUM: 4.1 mmol/L (ref 3.5–5.2)
Sodium: 141 mmol/L (ref 134–144)

## 2017-04-04 LAB — HGB A1C W/O EAG: HEMOGLOBIN A1C: 6.2 % — AB (ref 4.8–5.6)

## 2017-04-10 ENCOUNTER — Telehealth: Payer: Self-pay | Admitting: Adult Health

## 2017-04-10 NOTE — Telephone Encounter (Signed)
S/W PT.

## 2017-04-10 NOTE — Telephone Encounter (Signed)
New message     Returning call for lab results

## 2017-06-20 ENCOUNTER — Other Ambulatory Visit: Payer: Self-pay | Admitting: *Deleted

## 2017-06-20 ENCOUNTER — Other Ambulatory Visit: Payer: Self-pay

## 2017-08-12 ENCOUNTER — Emergency Department (HOSPITAL_COMMUNITY)
Admission: EM | Admit: 2017-08-12 | Discharge: 2017-08-12 | Disposition: A | Discharge status: Home / Self Care | Payer: Medicare Other | Attending: Emergency Medicine | Admitting: Emergency Medicine

## 2017-08-12 ENCOUNTER — Encounter (HOSPITAL_COMMUNITY): Payer: Self-pay

## 2017-08-12 ENCOUNTER — Other Ambulatory Visit: Payer: Self-pay

## 2017-08-12 DIAGNOSIS — E785 Hyperlipidemia, unspecified: Secondary | ICD-10-CM | POA: Diagnosis not present

## 2017-08-12 DIAGNOSIS — B372 Candidiasis of skin and nail: Secondary | ICD-10-CM | POA: Diagnosis not present

## 2017-08-12 DIAGNOSIS — N8110 Cystocele, unspecified: Secondary | ICD-10-CM | POA: Insufficient documentation

## 2017-08-12 DIAGNOSIS — J45909 Unspecified asthma, uncomplicated: Secondary | ICD-10-CM | POA: Diagnosis not present

## 2017-08-12 DIAGNOSIS — I10 Essential (primary) hypertension: Secondary | ICD-10-CM | POA: Diagnosis not present

## 2017-08-12 DIAGNOSIS — Z87891 Personal history of nicotine dependence: Secondary | ICD-10-CM | POA: Diagnosis not present

## 2017-08-12 DIAGNOSIS — E119 Type 2 diabetes mellitus without complications: Secondary | ICD-10-CM | POA: Diagnosis not present

## 2017-08-12 DIAGNOSIS — Z79899 Other long term (current) drug therapy: Secondary | ICD-10-CM | POA: Insufficient documentation

## 2017-08-12 DIAGNOSIS — N898 Other specified noninflammatory disorders of vagina: Secondary | ICD-10-CM | POA: Diagnosis present

## 2017-08-12 HISTORY — DX: Type 2 diabetes mellitus without complications: E11.9

## 2017-08-12 LAB — URINALYSIS, ROUTINE W REFLEX MICROSCOPIC
Bilirubin Urine: NEGATIVE
Glucose, UA: 150 mg/dL — AB
KETONES UR: NEGATIVE mg/dL
LEUKOCYTES UA: NEGATIVE
NITRITE: NEGATIVE
PH: 5 (ref 5.0–8.0)
Protein, ur: 100 mg/dL — AB
SPECIFIC GRAVITY, URINE: 1.015 (ref 1.005–1.030)

## 2017-08-12 LAB — WET PREP, GENITAL
Clue Cells Wet Prep HPF POC: NONE SEEN
SPERM: NONE SEEN
Trich, Wet Prep: NONE SEEN
WBC, Wet Prep HPF POC: NONE SEEN
Yeast Wet Prep HPF POC: NONE SEEN

## 2017-08-12 LAB — CBG MONITORING, ED: GLUCOSE-CAPILLARY: 93 mg/dL (ref 65–99)

## 2017-08-12 MED ORDER — FLUCONAZOLE 150 MG PO TABS: 150.0000 mg | ORAL_TABLET | Freq: Every day | ORAL | 0 refills | Status: AC

## 2017-08-12 NOTE — ED Notes (Signed)
POC [facility] 93

## 2017-08-12 NOTE — ED Provider Notes (Signed)
[facility] EMERGENCY DEPARTMENT Provider Note   CSN: [id number] Arrival date & time: 08/12/17  0732     History   Chief Complaint Chief Complaint  Patient presents with  . Vaginitis    HPI Lindsey Buckley is a 67 y.o. female.  HPI  The patient is a 67 year old female, she has a known history of diabetes, she currently takes [place], she has not been on any recent antibiotics and has not had sexual intercourse since 2003 according to her report.  She denies vaginal discharge but does complain of some vaginal itching which is her chief complaint.  This started approximately 1 week ago, does not seem to be getting any better, is not associated with discharge, vaginal bleeding, fevers abdominal pain or recent antibiotic use.  She has not tried any medications for this.  Symptoms are persistent and associated with a mild rash to the inner thighs bilaterally.  Past Medical History:  Diagnosis Date  . Asthma   . Chest pain    history of  . Diabetes mellitus without complication ([place])   . GERD (gastroesophageal reflux disease)   . Hyperlipidemia   . Other and unspecified hyperlipidemia   . Pulmonary congestion and hypostasis   . Pulmonary edema   . Unspecified essential hypertension     Patient Active Problem List   Diagnosis Date Noted  . Chest pain 05/16/2012  . HYPERLIPIDEMIA 12/30/2009  . GASTROESOPHAGEAL REFLUX DISEASE 12/30/2009  . Palpitations 12/30/2009  . HYPERTENSION 08/22/2008    Past Surgical History:  Procedure Laterality Date  . ABDOMINAL HYSTERECTOMY    . COLONOSCOPY    . TUBAL LIGATION       OB History   None      Home Medications    Prior to Admission medications   Medication Sig Start Date End Date Taking? Authorizing Provider  albuterol (PROVENTIL HFA;VENTOLIN HFA) 108 (90 BASE) MCG/ACT inhaler Inhale 2 puffs into the lungs every 4 (four) hours as needed. 07/28/14   Rolland Porter, MD  FARXIGA 10 MG TABS tablet TK 1 T PO QD 02/25/17    [provider]  fluconazole (DIFLUCAN) 150 MG tablet Take 1 tablet (150 mg total) by mouth daily for 3 days. 08/12/17 08/15/17  Noemi Chapel, MD  losartan (COZAAR) 25 MG tablet Take 1 tablet (25 mg total) by mouth daily. 03/06/17 06/04/17  Lendon Colonel, NP  metoprolol succinate (TOPROL-XL) 100 MG 24 hr tablet Take 1 tablet (100 mg total) by mouth 2 (two) times daily. 04/03/17   Arnoldo Lenis, MD  NIFEdipine (ADALAT CC) 90 MG 24 hr tablet Take 90 mg by mouth daily.      [provider]  nitroGLYCERIN (NITROSTAT) 0.4 MG SL tablet Place 1 tablet (0.4 mg total) under the tongue every 5 (five) minutes as needed for chest pain. 01/20/16   Arnoldo Lenis, MD  omeprazole (PRILOSEC) 20 MG capsule Take 1 capsule (20 mg total) by mouth 2 (two) times daily. 05/16/12   Imogene Burn, PA-C  rosuvastatin (CRESTOR) 40 MG tablet Take 20 mg by mouth daily. 05/03/11   Lendon Colonel, NP    Family History Family History  Problem Relation Age of Onset  . Uterine cancer Sister     Social History Social History   Tobacco Use  . Smoking status: Former Smoker    Packs/day: 0.40    Types: Cigarettes    Last attempt to quit: 12/11/1999    Years since quitting: 17.6  . Smokeless  tobacco: Never Used  Substance Use Topics  . Alcohol use: No  . Drug use: No     Allergies   Ace inhibitors and Lisinopril   Review of Systems Review of Systems  Constitutional: Negative for fever.  Gastrointestinal: Positive for diarrhea. Negative for abdominal pain, nausea and vomiting.  Genitourinary: Negative for difficulty urinating, dysuria, genital sores, menstrual problem, pelvic pain, vaginal discharge and vaginal pain.       Positive for itching  Skin: Positive for rash.     Physical Exam Updated Vital Signs BP (!) 156/108 (BP Location: Right Arm)   Pulse 83   Temp 98.1 F (36.7 C) (Oral)   Resp 18   Ht 5\' 5"  (1.651 m)   Wt 87.1 kg (192 lb)   SpO2 98%   BMI 31.95 kg/m     Physical Exam  Constitutional: She appears well-developed and well-nourished.  HENT:  Head: Normocephalic and atraumatic.  Eyes: Conjunctivae are normal. Right eye exhibits no discharge. Left eye exhibits no discharge.  Pulmonary/Chest: Effort normal. No respiratory distress.  Genitourinary:  Genitourinary Comments: Normal-appearing external genitalia, normal-appearing internal vaginal structures, no foreign bodies, no foul smell, no discharge, no bleeding.  There is what appears to be some redundant tissue superiorly consistent with bladder prolapse Chaperone present for the entire exam  Neurological: She is alert. Coordination normal.  Skin: Skin is warm and dry. No rash noted. She is not diaphoretic. No erythema.  Psychiatric: She has a normal mood and affect.  Nursing note and vitals reviewed.    ED Treatments / Results  Labs (all labs ordered are listed, but only abnormal results are displayed) Labs Reviewed  URINALYSIS, ROUTINE W REFLEX MICROSCOPIC - Abnormal; Notable for the following components:      Result Value   APPearance HAZY (*)    Glucose, UA 150 (*)    Hgb urine dipstick LARGE (*)    Protein, ur 100 (*)    Bacteria, UA RARE (*)    All other components within normal limits  WET PREP, GENITAL  CBG MONITORING, ED  GC/CHLAMYDIA PROBE AMP (Keys) NOT AT [facility]    EKG None  Radiology No results found.  Procedures Procedures (including critical care time)  Medications Ordered in ED Medications - No data to display   Initial Impression / Assessment and Plan / ED Course  I have reviewed the triage vital signs and the nursing notes.  Pertinent labs & imaging results that were available during my care of the patient were reviewed by me and considered in my medical decision making (see chart for details).  Clinical Course as of Aug 13 854  Sat Aug 12, 2017  6659 Wet prep - neg - Diflucan for her likely candidal infection of skin   [BM]    Clinical  Course User Index [BM] Noemi Chapel, MD    The patient does have a bladder prolapse on her vaginal exam but no other significant signs of discharge or drainage.  She does have some mild appearing rash in the vulvar area and onto the medial thighs which could be consistent with a candidal skin infection.  Check blood sugar discussed with the patient the need for referral to gynecology to further look at her possible bladder prolapse  No UTI, no STD seen, well-appearing, home with fluconazole  Final Clinical Impressions(s) / ED Diagnoses   Final diagnoses:  Bladder prolapse, female, acquired  Candidiasis of skin    ED Discharge Orders  Ordered    fluconazole (DIFLUCAN) 150 MG tablet  Daily     08/12/17 0855       Noemi Chapel, MD 08/12/17 [id number]

## 2017-08-12 NOTE — ED Triage Notes (Signed)
Pt reports she has had vaginal itching x 1 week and thinks has a yeast infection.  Pt reports has been using otc creams without relief.  Pt also c/o arms itching.

## 2017-08-12 NOTE — Discharge Instructions (Signed)
Please take fluconazole, 150 mg daily for the next 3 days, you do not have a vaginal yeast infection, your exam was otherwise normal.  Please follow-up with your gynecologist for evaluation of what looks like a prolapsed bladder.

## 2017-08-14 LAB — GC/CHLAMYDIA PROBE AMP (~~LOC~~) NOT AT ARMC
CHLAMYDIA, DNA PROBE: NEGATIVE
NEISSERIA GONORRHEA: NEGATIVE

## 2017-10-11 ENCOUNTER — Encounter: Payer: Self-pay | Admitting: Cardiology

## 2017-10-11 ENCOUNTER — Ambulatory Visit (INDEPENDENT_AMBULATORY_CARE_PROVIDER_SITE_OTHER): Payer: Medicare Other | Admitting: Cardiology

## 2017-10-11 VITALS — BP 146/76 | HR 81 | Ht 65.0 in | Wt 194.0 lb

## 2017-10-11 DIAGNOSIS — I10 Essential (primary) hypertension: Secondary | ICD-10-CM | POA: Diagnosis not present

## 2017-10-11 DIAGNOSIS — R0789 Other chest pain: Secondary | ICD-10-CM

## 2017-10-11 NOTE — Progress Notes (Signed)
Clinical Summary [name] is a 67 y.o.female seen today for follow up of the following medical problems.   1. Chest pain  - ongoing for several years  - prior cath with non-obstructive disease in [place] roughly 10 years ago at [facility] in [place], [place] - Exercise MPI 05/2012 withoutclear evidence of ischemia  - 01/2016 completed nuclear stress test, no ischemia.  - bleeding on ASA in the past  - denies any recent symptoms.    2. HTN - reports chlorthalidone was too difficult to cut, she stopped taking previously and did not want to restart - compliant with meds.     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 Asthma   . Chest pain    history of  . Diabetes mellitus without complication ([place])   . GERD (gastroesophageal reflux disease)   . Hyperlipidemia   . Other and unspecified hyperlipidemia   . Pulmonary congestion and hypostasis   . Pulmonary edema   . Unspecified essential hypertension      Allergies  Allergen Reactions  . Ace Inhibitors   . Lisinopril      Current Outpatient Medications  Medication Sig Dispense Refill  . albuterol (PROVENTIL HFA;VENTOLIN HFA) 108 (90 BASE) MCG/ACT inhaler Inhale 2 puffs into the lungs every 4 (four) hours as needed. 6.7 g 0  . FARXIGA 10 MG TABS tablet TK 1 T PO QD  5  . metoprolol succinate (TOPROL-XL) 100 MG 24 hr tablet Take 1 tablet (100 mg total) by mouth 2 (two) times daily. 180 tablet 3  . NIFEdipine (ADALAT CC) 90 MG 24 hr tablet Take 90 mg by mouth daily.      . nitroGLYCERIN (NITROSTAT) 0.4 MG SL tablet Place 1 tablet (0.4 mg total) under the tongue every 5 (five) minutes as needed for chest pain. 25 tablet 4  . omeprazole (PRILOSEC) 20 MG capsule Take 1 capsule (20 mg total) by mouth 2 (two) times daily. 60 capsule 6  . rosuvastatin (CRESTOR) 40 MG tablet Take 20 mg by mouth daily.     Marland Kitchen losartan (COZAAR) 25 MG tablet Take 1 tablet (25 mg total) by mouth daily. 90 tablet 3   No current facility-administered medications for this visit.      Past Surgical History:  Procedure Laterality Date  . ABDOMINAL HYSTERECTOMY    . COLONOSCOPY    . TUBAL LIGATION       Allergies  Allergen Reactions  . Ace Inhibitors   . Lisinopril       Family History  Problem Relation Age of Onset  . Uterine cancer Sister      Social History [name] reports that she quit smoking about 17 years ago. Her smoking use included cigarettes. She smoked 0.40 packs per day. She has never used smokeless tobacco. [name] reports that she does not drink alcohol.   Review of Systems CONSTITUTIONAL: No weight loss, fever, chills, weakness or fatigue.  HEENT: Eyes: No visual loss, blurred vision, double vision or yellow sclerae.No hearing loss, sneezing, congestion, runny nose or sore throat.  SKIN: No rash or itching.  CARDIOVASCULAR: per hpi RESPIRATORY: No shortness of breath, cough or sputum.  GASTROINTESTINAL: No anorexia, nausea, vomiting or diarrhea. No abdominal pain or blood.  GENITOURINARY: No burning on urination, no polyuria NEUROLOGICAL: No headache, dizziness, syncope, paralysis, ataxia, numbness or tingling  in the extremities. No change in bowel or bladder control.  MUSCULOSKELETAL: No muscle, back pain, joint pain or stiffness.  LYMPHATICS: No enlarged nodes. No history of splenectomy.  PSYCHIATRIC: No history of depression or anxiety.  ENDOCRINOLOGIC: No reports of sweating, cold or heat intolerance. No polyuria or polydipsia.  Marland Kitchen   Physical Examination Vitals:   10/11/17 0840  BP: (!) 146/76  Pulse: 81  SpO2: 94%   Filed Weights   10/11/17 0840  Weight: 194 lb (88 kg)    Gen: resting comfortably, no acute distress HEENT: no scleral icterus, pupils equal round and reactive, no palptable cervical adenopathy,  CV: RRR, no m/r/g, no jvd Resp: Clear to  auscultation bilaterally GI: abdomen is soft, non-tender, non-distended, normal bowel sounds, no hepatosplenomegaly MSK: extremities are warm, no edema.  Skin: warm, no rash Neuro:  no focal deficits Psych: appropriate affect   Diagnostic Studies 05/2012 Stress MPI Stress Data: Treadmill exercise performed to a workload of seven mets and a heart rate of 144, 90% of age predicted maximum. Exercise discontinued due to fatigue; no chest discomfort reported. Blood pressure increased from a resting value of 135/75 to 200/80, a borderline hypertensive response. No arrhythmias noted.  EKG: Normal sinus rhythm; delayed R-wave progression; nonspecific T- wave abnormality. Stress EKG: No significant change.  Scintigraphic Data: Acquisition notable for moderate movement for which software correction was applied. Moderate breast attenuation was present. Left ventricular size was normal. Moderately intense GI activity overlapped to the inferolateral, inferior and inferoseptal segments.  On tomographic myocardial images, a minimally sized apical defect of mild to moderate intensity was noted. Reversibility was present, complete in the antero-apical region and modest inferoapically. The gated reconstruction demonstrated normal regional and global LV systolic function as well as normal systolic accentuation of activity throughout. Estimated ejection fraction was 60%.  IMPRESSION: Probably normal stress nuclear myocardial study revealing impaired exercise capacity, a rapid increase in heart rate at low level exercise suggesting physical deconditioning, a negative stress EKG, normal left ventricular size and normal left ventricular systolic function. By scintigraphic imaging, there was a small apical defect with reversibility, possibly the result of patient movement plus breast attenuation artifact and physiologic apical thinning. A small degree of apical ischemia cannot be unequivocally  excluded. Other findings as noted.   06/11/13 Clinic EKG NSR   01/2016 Nuclear stress test  Inadequate heart rate response on treadmill, took Lopressor this morning. Switched to [employer] study. No diagnostic ST segment changes.  Small, mild intensity, apical anteroseptal and basal inferolateral defects that are fixed and consistent with soft tissue attenuation rather than scar with normal wall motion in these regions. No definitive ischemia.  This is a low risk study.  Nuclear stress EF: 57%.     Assessment and Plan  1. Chest pain  - no recent symptoms. Prior negative stress test  - continue to monitor  2. HTN - manual bp 134/75, borderline elevated - we discussed lifestyle modifications with weight loss, DASH diet, strict sodium restriction - monitor bp at this time, if remaisn elevated will need to increase losartan to 50mg  daily.          Arnoldo Lenis, M.D.

## 2017-10-11 NOTE — Patient Instructions (Signed)
Medication Instructions:  Your physician recommends that you continue on your current medications as directed. Please refer to the Current Medication list given to you today.   Labwork: I WILL REQUEST LABS FROM PCP   Testing/Procedures: NONE  Follow-Up: Your physician wants you to follow-up in: 6 MONTHS.  You will receive a reminder letter in the mail two months in advance. If you don't receive a letter, please call our office to schedule the follow-up appointment.   Any Other Special Instructions Will Be Listed Below (If Applicable).      If you need a refill on your cardiac medications before your next appointment, please call your pharmacy.  DASH Eating Plan DASH stands for "Dietary Approaches to Stop Hypertension." The DASH eating plan is a healthy eating plan that has been shown to reduce high blood pressure (hypertension). It may also reduce your risk for type 2 diabetes, heart disease, and stroke. The DASH eating plan may also help with weight loss. What are tips for following this plan? General guidelines  Avoid eating more than 2,300 mg (milligrams) of salt (sodium) a day. If you have hypertension, you may need to reduce your sodium intake to 1,500 mg a day.  Limit alcohol intake to no more than 1 drink a day for nonpregnant women and 2 drinks a day for men. One drink equals 12 oz of beer, 5 oz of wine, or 1 oz of hard liquor.  Work with your health care provider to maintain a healthy body weight or to lose weight. Ask what an ideal weight is for you.  Get at least 30 minutes of exercise that causes your heart to beat faster (aerobic exercise) most days of the week. Activities may include walking, swimming, or biking.  Work with your health care provider or diet and nutrition specialist (dietitian) to adjust your eating plan to your individual calorie needs. Reading food labels  Check food labels for the amount of sodium per serving. Choose foods with less than 5  percent of the Daily Value of sodium. Generally, foods with less than 300 mg of sodium per serving fit into this eating plan.  To find whole grains, look for the word "whole" as the first word in the ingredient list. Shopping  Buy products labeled as "low-sodium" or "no salt added."  Buy fresh foods. Avoid canned foods and premade or frozen meals. Cooking  Avoid adding salt when cooking. Use salt-free seasonings or herbs instead of table salt or sea salt. Check with your health care provider or pharmacist before using salt substitutes.  Do not fry foods. Cook foods using healthy methods such as baking, boiling, grilling, and broiling instead.  Cook with heart-healthy oils, such as olive, canola, soybean, or sunflower oil. Meal planning   Eat a balanced diet that includes: ? 5 or more servings of fruits and vegetables each day. At each meal, try to fill half of your plate with fruits and vegetables. ? Up to 6-8 servings of whole grains each day. ? Less than 6 oz of lean meat, poultry, or fish each day. A 3-oz serving of meat is about the same size as a deck of cards. One egg equals 1 oz. ? 2 servings of low-fat dairy each day. ? A serving of nuts, seeds, or beans 5 times each week. ? Heart-healthy fats. Healthy fats called Omega-3 fatty acids are found in foods such as flaxseeds and coldwater fish, like sardines, salmon, and mackerel.  Limit how much you eat of the  following: ? Canned or prepackaged foods. ? Food that is high in trans fat, such as fried foods. ? Food that is high in saturated fat, such as fatty meat. ? Sweets, desserts, sugary drinks, and other foods with added sugar. ? Full-fat dairy products.  Do not salt foods before eating.  Try to eat at least 2 vegetarian meals each week.  Eat more home-cooked food and less restaurant, buffet, and fast food.  When eating at a restaurant, ask that your food be prepared with less salt or no salt, if possible. What foods are  recommended? The items listed may not be a complete list. Talk with your dietitian about what dietary choices are best for you. Grains Whole-grain or whole-wheat bread. Whole-grain or whole-wheat pasta. Brown rice. Lindsey Buckley. Bulgur. Whole-grain and low-sodium cereals. Pita bread. Low-fat, low-sodium crackers. Whole-wheat flour tortillas. Vegetables Fresh or frozen vegetables (raw, steamed, roasted, or grilled). Low-sodium or reduced-sodium tomato and vegetable juice. Low-sodium or reduced-sodium tomato sauce and tomato paste. Low-sodium or reduced-sodium canned vegetables. Fruits All fresh, dried, or frozen fruit. Canned fruit in natural juice (without added sugar). Meat and other protein foods Skinless chicken or [place]. Ground chicken or [place]. Pork with fat trimmed off. Fish and seafood. Egg whites. Dried beans, peas, or lentils. Unsalted nuts, nut butters, and seeds. Unsalted canned beans. Lean cuts of beef with fat trimmed off. Low-sodium, lean deli meat. Dairy Low-fat (1%) or fat-free (skim) milk. Fat-free, low-fat, or reduced-fat cheeses. Nonfat, low-sodium ricotta or cottage cheese. Low-fat or nonfat yogurt. Low-fat, low-sodium cheese. Fats and oils Soft margarine without trans fats. Vegetable oil. Low-fat, reduced-fat, or light mayonnaise and salad dressings (reduced-sodium). Canola, safflower, olive, soybean, and sunflower oils. Avocado. Seasoning and other foods Herbs. Spices. Seasoning mixes without salt. Unsalted popcorn and pretzels. Fat-free sweets. What foods are not recommended? The items listed may not be a complete list. Talk with your dietitian about what dietary choices are best for you. Grains Baked goods made with fat, such as croissants, muffins, or some breads. Dry pasta or rice meal packs. Vegetables Creamed or fried vegetables. Vegetables in a cheese sauce. Regular canned vegetables (not low-sodium or reduced-sodium). Regular canned tomato sauce and paste (not  low-sodium or reduced-sodium). Regular tomato and vegetable juice (not low-sodium or reduced-sodium). Lindsey Buckley. Olives. Fruits Canned fruit in a light or heavy syrup. Fried fruit. Fruit in cream or butter sauce. Meat and other protein foods Fatty cuts of meat. Ribs. Fried meat. Berniece Salines. Sausage. Bologna and other processed lunch meats. Salami. Fatback. Hotdogs. Bratwurst. Salted nuts and seeds. Canned beans with added salt. Canned or smoked fish. Whole eggs or egg yolks. Chicken or [place] with skin. Dairy Whole or 2% milk, cream, and half-and-half. Whole or full-fat cream cheese. Whole-fat or sweetened yogurt. Full-fat cheese. Nondairy creamers. Whipped toppings. Processed cheese and cheese spreads. Fats and oils Butter. Stick margarine. Lard. Shortening. Ghee. Bacon fat. Tropical oils, such as coconut, palm kernel, or palm oil. Seasoning and other foods Salted popcorn and pretzels. Onion salt, garlic salt, seasoned salt, table salt, and sea salt. Worcestershire sauce. Tartar sauce. Barbecue sauce. Teriyaki sauce. Soy sauce, including reduced-sodium. Steak sauce. Canned and packaged gravies. Fish sauce. Oyster sauce. Cocktail sauce. Horseradish that you find on the shelf. Ketchup. Mustard. Meat flavorings and tenderizers. Bouillon cubes. Hot sauce and Tabasco sauce. Premade or packaged marinades. Premade or packaged taco seasonings. Relishes. Regular salad dressings. Where to find more information:  National Heart, Lung, and [place]: https://wilson-eaton.com/  American Heart Association: www.heart.org  Summary  The DASH eating plan is a healthy eating plan that has been shown to reduce high blood pressure (hypertension). It may also reduce your risk for type 2 diabetes, heart disease, and stroke.  With the DASH eating plan, you should limit salt (sodium) intake to 2,300 mg a day. If you have hypertension, you may need to reduce your sodium intake to 1,500 mg a day.  When on the DASH eating plan,  aim to eat more fresh fruits and vegetables, whole grains, lean proteins, low-fat dairy, and heart-healthy fats.  Work with your health care provider or diet and nutrition specialist (dietitian) to adjust your eating plan to your individual calorie needs. This information is not intended to replace advice given to you by your health care provider. Make sure you discuss any questions you have with your health care provider. Document Released: 03/10/2011 Document Revised: 03/14/2016 Document Reviewed: 03/14/2016 Elsevier Interactive Patient Education  [employer].

## 2017-11-16 ENCOUNTER — Other Ambulatory Visit: Payer: Self-pay | Admitting: Adult Health

## 2018-04-23 ENCOUNTER — Other Ambulatory Visit: Payer: Self-pay | Admitting: Cardiology

## 2018-04-25 ENCOUNTER — Other Ambulatory Visit (HOSPITAL_COMMUNITY): Payer: Self-pay | Admitting: Family Medicine

## 2018-04-25 DIAGNOSIS — Z1231 Encounter for screening mammogram for malignant neoplasm of breast: Secondary | ICD-10-CM

## 2018-04-27 ENCOUNTER — Encounter (HOSPITAL_COMMUNITY): Payer: Self-pay

## 2018-04-27 ENCOUNTER — Ambulatory Visit (HOSPITAL_COMMUNITY)
Admission: RE | Admit: 2018-04-27 | Discharge: 2018-04-27 | Disposition: A | Discharge status: Home / Self Care | Payer: Medicare Other | Source: Ambulatory Visit | Attending: Family Medicine | Admitting: Family Medicine

## 2018-04-27 DIAGNOSIS — Z1231 Encounter for screening mammogram for malignant neoplasm of breast: Secondary | ICD-10-CM | POA: Diagnosis present

## 2018-05-26 ENCOUNTER — Other Ambulatory Visit: Payer: Self-pay | Admitting: Cardiology

## 2018-06-03 ENCOUNTER — Other Ambulatory Visit: Payer: Self-pay | Admitting: Adult Health

## 2018-06-30 ENCOUNTER — Other Ambulatory Visit: Payer: Self-pay | Admitting: Cardiology

## 2018-07-31 ENCOUNTER — Other Ambulatory Visit: Payer: Self-pay | Admitting: Cardiology

## 2018-08-03 ENCOUNTER — Other Ambulatory Visit: Payer: Self-pay | Admitting: Cardiology

## 2018-08-31 ENCOUNTER — Other Ambulatory Visit: Payer: Self-pay | Admitting: Cardiology

## 2018-09-27 ENCOUNTER — Other Ambulatory Visit: Payer: Self-pay | Admitting: Cardiology

## 2018-10-26 ENCOUNTER — Other Ambulatory Visit: Payer: Self-pay | Admitting: Cardiology

## 2018-11-25 ENCOUNTER — Other Ambulatory Visit: Payer: Self-pay | Admitting: Cardiology

## 2018-12-25 ENCOUNTER — Other Ambulatory Visit: Payer: Self-pay

## 2018-12-25 MED ORDER — LOSARTAN POTASSIUM 25 MG PO TABS: 25.0000 mg | ORAL_TABLET | Freq: Every day | ORAL | 6 refills | Status: DC

## 2018-12-25 NOTE — Telephone Encounter (Signed)
Refilled losartan per [facility] fax request

## 2019-03-11 ENCOUNTER — Other Ambulatory Visit: Payer: Self-pay

## 2019-03-11 MED ORDER — METOPROLOL SUCCINATE ER 100 MG PO TB24: ORAL_TABLET | ORAL | 6 refills | Status: DC

## 2019-03-11 NOTE — Telephone Encounter (Signed)
Refilled metoprolol to [facility]

## 2019-04-05 DIAGNOSIS — E119 Type 2 diabetes mellitus without complications: Secondary | ICD-10-CM

## 2019-04-05 HISTORY — DX: Type 2 diabetes mellitus without complications: E11.9

## 2019-06-28 ENCOUNTER — Other Ambulatory Visit: Payer: Self-pay

## 2019-06-28 ENCOUNTER — Ambulatory Visit: Payer: Medicare Other | Attending: Internal Medicine

## 2019-06-28 DIAGNOSIS — Z20822 Contact with and (suspected) exposure to covid-19: Secondary | ICD-10-CM

## 2019-06-29 LAB — NOVEL CORONAVIRUS, NAA: SARS-CoV-2, NAA: NOT DETECTED

## 2019-06-29 LAB — SARS-COV-2, NAA 2 DAY TAT

## 2019-07-02 ENCOUNTER — Telehealth: Payer: Self-pay | Admitting: *Deleted

## 2019-07-02 NOTE — Telephone Encounter (Signed)
Patient called for COVID result- notified negative. 

## 2019-07-21 ENCOUNTER — Other Ambulatory Visit: Payer: Self-pay | Admitting: Cardiology

## 2019-07-23 ENCOUNTER — Other Ambulatory Visit (HOSPITAL_COMMUNITY): Payer: Self-pay | Admitting: Family Medicine

## 2019-07-23 DIAGNOSIS — Z1231 Encounter for screening mammogram for malignant neoplasm of breast: Secondary | ICD-10-CM

## 2019-07-24 ENCOUNTER — Other Ambulatory Visit: Payer: Self-pay

## 2019-07-24 ENCOUNTER — Ambulatory Visit (HOSPITAL_COMMUNITY)
Admission: RE | Admit: 2019-07-24 | Discharge: 2019-07-24 | Disposition: A | Discharge status: Home / Self Care | Payer: Medicare Other | Source: Ambulatory Visit | Attending: Family Medicine | Admitting: Family Medicine

## 2019-07-24 DIAGNOSIS — Z1231 Encounter for screening mammogram for malignant neoplasm of breast: Secondary | ICD-10-CM | POA: Diagnosis not present

## 2019-08-13 ENCOUNTER — Encounter: Payer: Self-pay | Admitting: Cardiology

## 2019-08-13 ENCOUNTER — Ambulatory Visit: Payer: Medicare Other | Admitting: Cardiology

## 2019-08-13 NOTE — Progress Notes (Deleted)
Clinical Summary Lindsey Buckley is a 69 y.o.female seen today for follow up of the following medical problems.   1. Chest pain  - ongoing for several years  - prior cath with non-obstructive disease in [place] roughly 10 years ago at [facility] in [place], [place] - Exercise MPI 05/2012 withoutclear evidence of ischemia  - 01/2016 completed nuclear stress test, no ischemia. - bleeding on ASA in the past  - denies any recent symptoms.    2. HTN - reports chlorthalidone was too difficult to cut, she stopped taking previously and did not want to restart - compliant with meds.     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 Asthma   . Chest pain    history of  . Diabetes mellitus without complication ([place])   . GERD (gastroesophageal reflux disease)   . Hyperlipidemia   . Other and unspecified hyperlipidemia   . Pulmonary congestion and hypostasis   . Pulmonary edema   . Unspecified essential hypertension      Allergies  Allergen Reactions  . Ace Inhibitors   . Lisinopril      Current Outpatient Medications  Medication Sig Dispense Refill  . albuterol (PROVENTIL HFA;VENTOLIN HFA) 108 (90 BASE) MCG/ACT inhaler Inhale 2 puffs into the lungs every 4 (four) hours as needed. 6.7 g 0  . FARXIGA 10 MG TABS tablet TK 1 T PO QD  5  . losartan (COZAAR) 25 MG tablet TAKE 1 TABLET(25 MG) BY MOUTH DAILY 30 tablet 6  . metoprolol succinate (TOPROL-XL) 100 MG 24 hr tablet TAKE 1 TABLET(100 MG) BY MOUTH TWICE DAILY 60 tablet 6  . NIFEdipine (ADALAT CC) 90 MG 24 hr tablet Take 90 mg by mouth daily.      . nitroGLYCERIN (NITROSTAT) 0.4 MG SL tablet Place 1 tablet (0.4 mg total) under the tongue every 5 (five) minutes as needed for chest pain. 25 tablet 4  . omeprazole (PRILOSEC) 20 MG capsule Take 1 capsule (20 mg total) by mouth 2 (two) times daily. 60  capsule 6  . rosuvastatin (CRESTOR) 40 MG tablet Take 20 mg by mouth daily.     No current facility-administered medications for this visit.     Past Surgical History:  Procedure Laterality Date  . ABDOMINAL HYSTERECTOMY    . COLONOSCOPY    . TUBAL LIGATION       Allergies  Allergen Reactions  . Ace Inhibitors   . Lisinopril       Family History  Problem Relation Age of Onset  . Uterine cancer Sister      Social History Lindsey Buckley reports that she quit smoking about 19 years ago. Her smoking use included cigarettes. She smoked 0.40 packs per day. She has never used smokeless tobacco. Lindsey Buckley reports no history of alcohol use.   Review of Systems CONSTITUTIONAL: No weight loss, fever, chills, weakness or fatigue.  HEENT: Eyes: No visual loss, blurred vision, double vision or yellow sclerae.No hearing loss, sneezing, congestion, runny nose or sore throat.  SKIN: No rash or itching.  CARDIOVASCULAR:  RESPIRATORY: No shortness of breath, cough or sputum.  GASTROINTESTINAL: No anorexia, nausea, vomiting or diarrhea. No abdominal pain or blood.  GENITOURINARY: No burning on urination, no polyuria NEUROLOGICAL: No headache, dizziness, syncope, paralysis, ataxia, numbness or tingling in the extremities. No change in bowel or bladder control.  MUSCULOSKELETAL: No muscle, back pain, joint pain or stiffness.  LYMPHATICS: No enlarged nodes. No history of splenectomy.  PSYCHIATRIC: No history of depression or anxiety.  ENDOCRINOLOGIC: No reports of sweating, cold or heat intolerance. No polyuria or polydipsia.  Marland Kitchen   Physical Examination There were no vitals filed for this visit. There were no vitals filed for this visit.  Gen: resting comfortably, no acute distress HEENT: no scleral icterus, pupils equal round and reactive, no palptable cervical adenopathy,  CV Resp: Clear to auscultation bilaterally GI: abdomen is soft, non-tender, non-distended, normal bowel sounds,  no hepatosplenomegaly MSK: extremities are warm, no edema.  Skin: warm, no rash Neuro:  no focal deficits Psych: appropriate affect   Diagnostic Studies 05/2012 Stress MPI Stress Data: Treadmill exercise performed to a workload of seven mets and a heart rate of 144, 90% of age predicted maximum. Exercise discontinued due to fatigue; no chest discomfort reported. Blood pressure increased from a resting value of 135/75 to 200/80, a borderline hypertensive response. No arrhythmias noted.  EKG: Normal sinus rhythm; delayed R-wave progression; nonspecific T- wave abnormality. Stress EKG: No significant change.  Scintigraphic Data: Acquisition notable for moderate movement for which software correction was applied. Moderate breast attenuation was present. Left ventricular size was normal. Moderately intense GI activity overlapped to the inferolateral, inferior and inferoseptal segments.  On tomographic myocardial images, a minimally sized apical defect of mild to moderate intensity was noted. Reversibility was present, complete in the antero-apical region and modest inferoapically. The gated reconstruction demonstrated normal regional and global LV systolic function as well as normal systolic accentuation of activity throughout. Estimated ejection fraction was 60%.  IMPRESSION: Probably normal stress nuclear myocardial study revealing impaired exercise capacity, a rapid increase in heart rate at low level exercise suggesting physical deconditioning, a negative stress EKG, normal left ventricular size and normal left ventricular systolic function. By scintigraphic imaging, there was a small apical defect with reversibility, possibly the result of patient movement plus breast attenuation artifact and physiologic apical thinning. A small degree of apical ischemia cannot be unequivocally excluded. Other findings as noted.   06/11/13 Clinic EKG NSR   01/2016 Nuclear stress  test  Inadequate heart rate response on treadmill, took Lopressor this morning. Switched to [employer] study. No diagnostic ST segment changes.  Small, mild intensity, apical anteroseptal and basal inferolateral defects that are fixed and consistent with soft tissue attenuation rather than scar with normal wall motion in these regions. No definitive ischemia.  This is a low risk study.  Nuclear stress EF: 57%.     Assessment and Plan  1. Chest pain  - no recent symptoms. Prior negative stress test  - continue to monitor  2. HTN - manual bp 134/75, borderline elevated - we discussed lifestyle modifications with weight loss, DASH diet, strict sodium restriction - monitor bp at this time, if remaisn elevated will need to increase losartan to 50mg  daily.       Arnoldo Lenis, M.D., F.A.C.C.

## 2019-09-02 ENCOUNTER — Other Ambulatory Visit: Payer: Self-pay

## 2019-09-02 ENCOUNTER — Emergency Department (HOSPITAL_COMMUNITY)
Admission: EM | Admit: 2019-09-02 | Discharge: 2019-09-02 | Disposition: A | Discharge status: Home / Self Care | Payer: Medicare Other | Attending: Emergency Medicine | Admitting: Emergency Medicine

## 2019-09-02 ENCOUNTER — Encounter (HOSPITAL_COMMUNITY): Payer: Self-pay | Admitting: *Deleted

## 2019-09-02 DIAGNOSIS — L255 Unspecified contact dermatitis due to plants, except food: Secondary | ICD-10-CM | POA: Diagnosis present

## 2019-09-02 DIAGNOSIS — E119 Type 2 diabetes mellitus without complications: Secondary | ICD-10-CM | POA: Insufficient documentation

## 2019-09-02 DIAGNOSIS — J45909 Unspecified asthma, uncomplicated: Secondary | ICD-10-CM | POA: Insufficient documentation

## 2019-09-02 DIAGNOSIS — Z79899 Other long term (current) drug therapy: Secondary | ICD-10-CM | POA: Diagnosis not present

## 2019-09-02 DIAGNOSIS — Z87891 Personal history of nicotine dependence: Secondary | ICD-10-CM | POA: Insufficient documentation

## 2019-09-02 LAB — CBG MONITORING, ED: Glucose-Capillary: 92 mg/dL (ref 70–99)

## 2019-09-02 MED ORDER — PREDNISONE 20 MG PO TABS: 40.0000 mg | ORAL_TABLET | Freq: Every day | ORAL | 0 refills | Status: DC

## 2019-09-02 NOTE — ED Notes (Signed)
Pt was seen in PCP office and finished a 14 day course of prednisone. Rash had improved but is worsening the last few days.

## 2019-09-02 NOTE — Discharge Instructions (Addendum)
Take the prednisone as directed until its finished.  You may continue using your calamine lotion and Benadryl cream.  Take over-the-counter Benadryl capsules, 1 capsule every 4-6 hours as needed for itching.  This may cause drowsiness.  Do not operate machinery or drive if taking Benadryl.  Follow-up with your primary doctor for recheck if needed.

## 2019-09-02 NOTE — ED Provider Notes (Signed)
[facility] EMERGENCY DEPARTMENT Provider Note   CSN: [id number] Arrival date & time: 09/02/19  1055     History Chief Complaint  Patient presents with  . Poison Ivy    Lindsey Buckley is a 69 y.o. female.  HPI      Lindsey Buckley is a 69 y.o. female with past medical history of type 2 diabetes, acid reflux, hyperlipidemia, hypertension who presents to the Emergency Department complaining of itching and rash to the bilateral forearms.  Symptoms have been present for 3 weeks.  She was seen by her PCP at onset and given a prescription for low-dose prednisone which she completed and symptoms began improving, but states her rash and itching reoccurred after completing the prednisone course.  She has been applying over-the-counter Benadryl cream, calamine lotion, without improvement.  She denies swelling of her face lips or tongue, shortness of breath, chest pain, or rash that has spread to other parts of her body.   Past Medical History:  Diagnosis Date  . Asthma   . Chest pain    history of  . Diabetes mellitus without complication ([place])   . GERD (gastroesophageal reflux disease)   . Hyperlipidemia   . Other and unspecified hyperlipidemia   . Pulmonary congestion and hypostasis   . Pulmonary edema   . Unspecified essential hypertension     Patient Active Problem List   Diagnosis Date Noted  . Chest pain 05/16/2012  . HYPERLIPIDEMIA 12/30/2009  . GASTROESOPHAGEAL REFLUX DISEASE 12/30/2009  . Palpitations 12/30/2009  . HYPERTENSION 08/22/2008    Past Surgical History:  Procedure Laterality Date  . ABDOMINAL HYSTERECTOMY    . COLONOSCOPY    . TUBAL LIGATION       OB History   No obstetric history on file.     Family History  Problem Relation Age of Onset  . Uterine cancer Sister     Social History   Tobacco Use  . Smoking status: Former Smoker    Packs/day: 0.40    Types: Cigarettes    Quit date: 12/11/1999    Years since quitting: 19.7  .  Smokeless tobacco: Never Used  Substance Use Topics  . Alcohol use: No  . Drug use: No    Home Medications Prior to Admission medications   Medication Sig Start Date End Date Taking? Authorizing Provider  albuterol (PROVENTIL HFA;VENTOLIN HFA) 108 (90 BASE) MCG/ACT inhaler Inhale 2 puffs into the lungs every 4 (four) hours as needed. 07/28/14  Yes Rolland Porter, MD  diphenhydrAMINE (BENADRYL) 25 mg capsule Take 50 mg by mouth every 6 (six) hours as needed.   Yes [provider]  FARXIGA 10 MG TABS tablet TK 1 T PO QD 02/25/17  Yes [provider]  losartan (COZAAR) 25 MG tablet TAKE 1 TABLET(25 MG) BY MOUTH DAILY 07/22/19  Yes Branch, Alphonse Guild, MD  metoprolol succinate (TOPROL-XL) 100 MG 24 hr tablet TAKE 1 TABLET(100 MG) BY MOUTH TWICE DAILY 03/11/19  Yes Branch, Alphonse Guild, MD  NIFEdipine (ADALAT CC) 90 MG 24 hr tablet Take 90 mg by mouth daily.     Yes [provider]  omeprazole (PRILOSEC) 20 MG capsule Take 1 capsule (20 mg total) by mouth 2 (two) times daily. 05/16/12  Yes Imogene Burn, PA-C  rosuvastatin (CRESTOR) 40 MG tablet Take 20 mg by mouth daily. 05/03/11  Yes Lendon Colonel, NP  nitroGLYCERIN (NITROSTAT) 0.4 MG SL tablet Place 1 tablet (0.4 mg total) under the tongue every 5 (  five) minutes as needed for chest pain. 01/20/16   Arnoldo Lenis, MD    Allergies    Ace inhibitors and Lisinopril  Review of Systems   Review of Systems  Constitutional: Negative for activity change, appetite change, chills and fever.  HENT: Negative for facial swelling, sore throat and trouble swallowing.   Respiratory: Negative for chest tightness, shortness of breath and wheezing.   Cardiovascular: Negative for chest pain.  Gastrointestinal: Negative for abdominal pain, nausea and vomiting.  Musculoskeletal: Negative for neck pain and neck stiffness.  Skin: Positive for rash. Negative for wound.  Neurological: Negative for dizziness, weakness, numbness and  headaches.    Physical Exam Updated Vital Signs BP (!) 160/91 (BP Location: Left Arm)   Pulse 75   Temp 97.8 F (36.6 C)   Resp 17   Ht 5\' 4"  (1.626 m)   Wt 86.2 kg   SpO2 97%   BMI 32.61 kg/m   Physical Exam Vitals and nursing note reviewed.  Constitutional:      General: She is not in acute distress.    Appearance: Normal appearance. She is well-developed. She is not ill-appearing.  HENT:     Mouth/Throat:     Mouth: Mucous membranes are moist.     Pharynx: Oropharynx is clear.  Cardiovascular:     Rate and Rhythm: Normal rate and regular rhythm.  Pulmonary:     Effort: Pulmonary effort is normal. No respiratory distress.     Breath sounds: Normal breath sounds.  Musculoskeletal:        General: No swelling or tenderness.     Cervical back: Normal range of motion and neck supple.  Lymphadenopathy:     Cervical: No cervical adenopathy.  Skin:    General: Skin is warm.     Capillary Refill: Capillary refill takes less than 2 seconds.     Findings: Erythema and rash present.     Comments: Erythematous, maculopapular rash of the bilateral arms including proximal forearm and distal upper arm.  No edema, no weeping or purulent drainage.  Neurological:     General: No focal deficit present.     Mental Status: She is alert.     Sensory: No sensory deficit.     Motor: No weakness or abnormal muscle tone.     ED Results / Procedures / Treatments   Labs (all labs ordered are listed, but only abnormal results are displayed) Labs Reviewed  CBG MONITORING, ED    EKG None  Radiology No results found.  Procedures Procedures (including critical care time)  Medications Ordered in ED Medications - No data to display  ED Course  I have reviewed the triage vital signs and the nursing notes.  Pertinent labs & imaging results that were available during my care of the patient were reviewed by me and considered in my medical decision making (see chart for details).      MDM Rules/Calculators/A&P                      Patient is well-appearing and vitals reviewed. She is here with waxing and waning rash to bilateral forearms and upper arms for 3 weeks.  Symptoms improved temporarily with low-dose steroid but did not resolve.  She has a maculopapular rash on exam that appears consistent with plant dermatitis.  She is neurovascularly intact.  There is no edema noted and rash is localized to the extremities.  Prescription given for oral prednisone, she will continue to  use over-the-counter calamine lotion.  I have recommended oral Benadryl if needed for itching.    Final Clinical Impression(s) / ED Diagnoses Final diagnoses:  Plant dermatitis    Rx / DC Orders ED Discharge Orders    None       Bufford Lope 09/02/19 1301    Noemi Chapel, MD 09/06/19 2025

## 2019-09-02 NOTE — ED Triage Notes (Signed)
Pt c/o poison ivy rash to bilateral arms x 3 weeks

## 2019-10-16 ENCOUNTER — Other Ambulatory Visit: Payer: Self-pay | Admitting: Cardiology

## 2020-01-15 ENCOUNTER — Other Ambulatory Visit: Payer: Self-pay | Admitting: Cardiology

## 2020-01-18 ENCOUNTER — Other Ambulatory Visit: Payer: Self-pay

## 2020-01-18 ENCOUNTER — Emergency Department (HOSPITAL_COMMUNITY)
Admission: EM | Admit: 2020-01-18 | Discharge: 2020-01-18 | Disposition: A | Discharge status: Home / Self Care | Payer: Medicare Other | Attending: Emergency Medicine | Admitting: Emergency Medicine

## 2020-01-18 ENCOUNTER — Encounter (HOSPITAL_COMMUNITY): Payer: Self-pay

## 2020-01-18 DIAGNOSIS — Z79899 Other long term (current) drug therapy: Secondary | ICD-10-CM | POA: Diagnosis not present

## 2020-01-18 DIAGNOSIS — E119 Type 2 diabetes mellitus without complications: Secondary | ICD-10-CM | POA: Diagnosis not present

## 2020-01-18 DIAGNOSIS — H5711 Ocular pain, right eye: Secondary | ICD-10-CM | POA: Diagnosis present

## 2020-01-18 DIAGNOSIS — J45909 Unspecified asthma, uncomplicated: Secondary | ICD-10-CM | POA: Insufficient documentation

## 2020-01-18 DIAGNOSIS — B309 Viral conjunctivitis, unspecified: Secondary | ICD-10-CM | POA: Diagnosis not present

## 2020-01-18 DIAGNOSIS — R21 Rash and other nonspecific skin eruption: Secondary | ICD-10-CM

## 2020-01-18 DIAGNOSIS — I10 Essential (primary) hypertension: Secondary | ICD-10-CM | POA: Insufficient documentation

## 2020-01-18 DIAGNOSIS — Z87891 Personal history of nicotine dependence: Secondary | ICD-10-CM | POA: Insufficient documentation

## 2020-01-18 HISTORY — DX: Dermatitis, unspecified: L30.9

## 2020-01-18 MED ORDER — TETRACAINE HCL 0.5 % OP SOLN
1.0000 [drp] | Freq: Once | OPHTHALMIC | Status: AC
  Administered 2020-01-18: 1 [drp] via OPHTHALMIC
  Filled 2020-01-18: qty 4

## 2020-01-18 MED ORDER — VALACYCLOVIR HCL 1 G PO TABS: 1000.0000 mg | ORAL_TABLET | Freq: Three times a day (TID) | ORAL | 0 refills | Status: AC

## 2020-01-18 MED ORDER — FLUORESCEIN SODIUM 1 MG OP STRP
1.0000 | ORAL_STRIP | Freq: Once | OPHTHALMIC | Status: AC
  Administered 2020-01-18: 1 via OPHTHALMIC
  Filled 2020-01-18: qty 1

## 2020-01-18 MED ORDER — CEPHALEXIN 250 MG PO CAPS: 250.0000 mg | ORAL_CAPSULE | Freq: Four times a day (QID) | ORAL | 0 refills | Status: DC

## 2020-01-18 MED ORDER — MUPIROCIN CALCIUM 2 % EX CREA: 1.0000 "application " | TOPICAL_CREAM | Freq: Two times a day (BID) | CUTANEOUS | 0 refills | Status: DC

## 2020-01-18 NOTE — ED Provider Notes (Signed)
[facility] EMERGENCY DEPARTMENT Provider Note   CSN: [id number] Arrival date & time: 01/18/20  8280     History Chief Complaint  Patient presents with  . Eye Pain    Lindsey Buckley is a 69 y.o. female.  HPI 69 year old female with a history of DM type II, asthma, eczema, GERD, hyperlipidemia, hypertension presents to the ER with complaints of 5 days of left eye swelling, drainage and redness. Drainage is a slight greenish color.  Patient states that she sleeps on her hands and thinks that she may have scratched her eye with her regular shoes sleeping.  She denies any vision changes, but states that she felt like there was something in her eye until yesterday when she put some eyedrops and this improved.  She denies any contact use.  She also noticed some "bumps" right above her right eye which developed on Wednesday.  They are not painful or itchy.  She denies any history of shingles.  Denies any fevers, chills, pain with eye movement.  She was recently diagnosed with eczema and had started betamethasone cream, however her eczema is mostly on the inside of her forearms and her back and she denies using this on her face.    Past Medical History:  Diagnosis Date  . Asthma   . Chest pain    history of  . Diabetes mellitus without complication ([place])   . Eczema   . GERD (gastroesophageal reflux disease)   . Hyperlipidemia   . Other and unspecified hyperlipidemia   . Pulmonary congestion and hypostasis   . Pulmonary edema   . Unspecified essential hypertension     Patient Active Problem List   Diagnosis Date Noted  . Chest pain 05/16/2012  . HYPERLIPIDEMIA 12/30/2009  . GASTROESOPHAGEAL REFLUX DISEASE 12/30/2009  . Palpitations 12/30/2009  . HYPERTENSION 08/22/2008    Past Surgical History:  Procedure Laterality Date  . ABDOMINAL HYSTERECTOMY    . COLONOSCOPY    . TUBAL LIGATION       OB History   No obstetric history on file.     Family History  Problem  Relation Age of Onset  . Uterine cancer Sister     Social History   Tobacco Use  . Smoking status: Former Smoker    Packs/day: 0.40    Types: Cigarettes    Quit date: 12/11/1999    Years since quitting: 20.1  . Smokeless tobacco: Never Used  Vaping Use  . Vaping Use: Never used  Substance Use Topics  . Alcohol use: No  . Drug use: No    Home Medications Prior to Admission medications   Medication Sig Start Date End Date Taking? Authorizing Provider  albuterol (PROVENTIL HFA;VENTOLIN HFA) 108 (90 BASE) MCG/ACT inhaler Inhale 2 puffs into the lungs every 4 (four) hours as needed. 07/28/14   Rolland Porter, MD  diphenhydrAMINE (BENADRYL) 25 mg capsule Take 50 mg by mouth every 6 (six) hours as needed.    [provider]  FARXIGA 10 MG TABS tablet TK 1 T PO QD 02/25/17   [provider]  losartan (COZAAR) 25 MG tablet TAKE 1 TABLET(25 MG) BY MOUTH DAILY 01/15/20   Arnoldo Lenis, MD  metoprolol succinate (TOPROL-XL) 100 MG 24 hr tablet TAKE 1 TABLET(100 MG) BY MOUTH TWICE DAILY 10/16/19   Arnoldo Lenis, MD  mupirocin cream (BACTROBAN) 2 % Apply 1 application topically 2 (two) times daily. 01/18/20   Garald Balding, PA-C  NIFEdipine (ADALAT CC) 90 MG  24 hr tablet Take 90 mg by mouth daily.      [provider]  nitroGLYCERIN (NITROSTAT) 0.4 MG SL tablet Place 1 tablet (0.4 mg total) under the tongue every 5 (five) minutes as needed for chest pain. 01/20/16   Arnoldo Lenis, MD  omeprazole (PRILOSEC) 20 MG capsule Take 1 capsule (20 mg total) by mouth 2 (two) times daily. 05/16/12   Imogene Burn, PA-C  predniSONE (DELTASONE) 20 MG tablet Take 2 tablets (40 mg total) by mouth daily. 09/02/19   Triplett, Tammy, PA-C  rosuvastatin (CRESTOR) 40 MG tablet Take 20 mg by mouth daily. 05/03/11   Lendon Colonel, NP  valACYclovir (VALTREX) 1000 MG tablet Take 1 tablet (1,000 mg total) by mouth 3 (three) times daily for 14 days. 01/18/20 02/01/20  Garald Balding, PA-C    Allergies    Ace inhibitors and Lisinopril  Review of Systems   Review of Systems  Constitutional: Negative for chills and fever.  HENT: Negative for facial swelling.   Eyes: Positive for discharge and redness. Negative for photophobia, pain, itching and visual disturbance.  Skin: Positive for rash.    Physical Exam Updated Vital Signs BP (!) 151/104 (BP Location: Right Arm)   Pulse 78   Temp 98.6 F (37 C) (Oral)   Resp 16   Ht 5\' 4"  (1.626 m)   Wt 81.6 kg   SpO2 99%   BMI 30.90 kg/m   Physical Exam Vitals and nursing note reviewed.  Constitutional:      General: She is not in acute distress.    Appearance: Normal appearance. She is well-developed. She is not ill-appearing, toxic-appearing or diaphoretic.  HENT:     Head: Normocephalic and atraumatic.     Nose: Nose normal.     Mouth/Throat:     Mouth: Mucous membranes are moist.     Pharynx: Oropharynx is clear.  Eyes:     General: No scleral icterus.       Right eye: Discharge present.        Left eye: No discharge.     Extraocular Movements: Extraocular movements intact.     Conjunctiva/sclera: Conjunctivae normal.     Pupils: Pupils are equal, round, and reactive to light.     Comments: Mildy injected conjunctiva, EOMs intact, pupils equal and reactive, slight greenish discharge in the corner of the eye.  No excessive surrounding fluctuance or erythema.  Woods lamp exam without evidence of abrasions, dendritic lesions  Cardiovascular:     Rate and Rhythm: Normal rate and regular rhythm.     Pulses: Normal pulses.     Heart sounds: Normal heart sounds. No murmur heard.   Pulmonary:     Effort: Pulmonary effort is normal. No respiratory distress.     Breath sounds: Normal breath sounds.  Abdominal:     General: Abdomen is flat.     Palpations: Abdomen is soft.     Tenderness: There is no abdominal tenderness.  Musculoskeletal:     Cervical back: Normal range of motion and neck supple.  Skin:     General: Skin is warm and dry.     Comments: Small cluster of crusted over lesions above right eyebrow medially. No excessive erythema, drainage, No evidence of bullae, sloughing, blisters, pustules, no warmth, draining sinus tracts, no superficial abscesses, no vesicles, desquamation, no target lesions with dusky purpura or central bulla.  Not tender to touch.    Neurological:     Mental Status:  She is alert.       ED Results / Procedures / Treatments   Labs (all labs ordered are listed, but only abnormal results are displayed) Labs Reviewed - No data to display  EKG None  Radiology No results found.  Procedures Procedures (including critical care time)  Medications Ordered in ED Medications  tetracaine (PONTOCAINE) 0.5 % ophthalmic solution 1 drop (has no administration in time range)  fluorescein ophthalmic strip 1 strip (has no administration in time range)    ED Course  I have reviewed the triage vital signs and the nursing notes.  Pertinent labs & imaging results that were available during my care of the patient were reviewed by me and considered in my medical decision making (see chart for details).    MDM Rules/Calculators/A&P                         69 year old female with complaints of eye redness, drainage and some swelling for approximately 5 days, along with a rash to the right upper eyebrow   On presentation, the patient is well appearing, resting comfortably, nontoxic appearing. Denying any vision changes or pain with eye movement. Woods lamp exam without evidence of abrasions, dendritic lesions. Visual acuity intact.  Pt dx likely viral conjunctivitis based on presentation & eye exam, no antibiotics are indicated.  No evidence of HSV or VSV infection. Pt is not a contact lens wearer.  Exam non-concerning for orbital cellulitis, hyphema, corneal ulcers, corneal abrasions or trauma.   Patient has been instructed to use cool compresses and practice personal  hygiene with frequent hand washing.  Patient understands to follow up with ophthalmology, especially if new symptoms including change in vision, purulent drainage, or entrapment occur.    In regards to her rash, it does appear to have crusted over lesions. Ddx includes impetigo vs shingles. She denies any visual changes, concern for zoster ophthalmicus low at this time. Patient denies any difficulty breathing or swallowing.  Pt has a patent airway without stridor and is handling secretions without difficulty; no angioedema. No blisters, no pustules, no warmth, no draining sinus tracts, no superficial abscesses, no bullous impetigo, no desquamation, no target lesions with dusky purpura or a central bulla. Not tender to touch. No concern for superimposed infection. No concern for SJS, TEN, TSS, tick borne illness, syphilis or other life-threatening condition. Will discharge home with bactroban and prophylactic treatment for shingles with valtrex. Pt was instructed to follow up with ophthalmology if she begins to have any visual changes or return to the ER.  She voices understanding and is agreeable.   At this stage in the ED course the patient has been adequately screened and stable for discharge  Discussed the case w/ Dr. Tomi Bamberger who is agreeable to the above plan and disposition.    Final Clinical Impression(s) / ED Diagnoses Final diagnoses:  Rash  Viral conjunctivitis of right eye    Rx / DC Orders ED Discharge Orders         Ordered    mupirocin cream (BACTROBAN) 2 %  2 times daily        01/18/20 0917    cephALEXin (KEFLEX) 250 MG capsule  4 times daily,   Status:  Discontinued        01/18/20 0917    valACYclovir (VALTREX) 1000 MG tablet  3 times daily        01/18/20 0932  Garald Balding, PA-C 01/18/20 1004    Dorie Rank, MD 01/19/20 [id number]

## 2020-01-18 NOTE — Discharge Instructions (Addendum)
Please apply the topical antibiotic the the rash above your eyebrow as prescribed.  As discussed, we will also treat you for shingles today.  Please take the medication as prescribed until finished. Your symptoms could be contagious, make sure to wash your hands with soap and water. See the additional handout for more information on how to prevent spreading and treatment.  Apply warm compresses to your eye and you may use over the counter eye drops. Your symptoms are likely do to a virus and so antibiotics are not used to treat this,  If you start to develop worsening eye pain, vision changes, etc. please make sure to return to the ER or follow-up with your eye doctor. Return to the ER for any new or worsening symptoms.

## 2020-01-18 NOTE — ED Triage Notes (Signed)
Pt presents to ED with complaints of right eye swelling, redness and drainage since Tuesday. Pt states bumps above right eyebrow also come up on Tuesday. Pt has been using Betamethasone Cream for eczema she was recently diagnosed with but denies using on her face.

## 2020-02-17 ENCOUNTER — Emergency Department (HOSPITAL_COMMUNITY)
Admission: EM | Admit: 2020-02-17 | Discharge: 2020-02-17 | Disposition: A | Discharge status: Home / Self Care | Payer: Medicare Other | Attending: Emergency Medicine | Admitting: Emergency Medicine

## 2020-02-17 ENCOUNTER — Other Ambulatory Visit: Payer: Self-pay

## 2020-02-17 ENCOUNTER — Encounter (HOSPITAL_COMMUNITY): Payer: Self-pay

## 2020-02-17 ENCOUNTER — Emergency Department (HOSPITAL_COMMUNITY): Payer: Medicare Other

## 2020-02-17 DIAGNOSIS — Z87891 Personal history of nicotine dependence: Secondary | ICD-10-CM | POA: Diagnosis not present

## 2020-02-17 DIAGNOSIS — R1032 Left lower quadrant pain: Secondary | ICD-10-CM | POA: Insufficient documentation

## 2020-02-17 DIAGNOSIS — K625 Hemorrhage of anus and rectum: Secondary | ICD-10-CM | POA: Diagnosis not present

## 2020-02-17 DIAGNOSIS — J45909 Unspecified asthma, uncomplicated: Secondary | ICD-10-CM | POA: Diagnosis not present

## 2020-02-17 DIAGNOSIS — E119 Type 2 diabetes mellitus without complications: Secondary | ICD-10-CM | POA: Insufficient documentation

## 2020-02-17 DIAGNOSIS — Z79899 Other long term (current) drug therapy: Secondary | ICD-10-CM | POA: Diagnosis not present

## 2020-02-17 DIAGNOSIS — I10 Essential (primary) hypertension: Secondary | ICD-10-CM | POA: Diagnosis not present

## 2020-02-17 DIAGNOSIS — N2889 Other specified disorders of kidney and ureter: Secondary | ICD-10-CM

## 2020-02-17 LAB — CBC WITH DIFFERENTIAL/PLATELET
Abs Immature Granulocytes: 0.04 10*3/uL (ref 0.00–0.07)
Basophils Absolute: 0.1 10*3/uL (ref 0.0–0.1)
Basophils Relative: 1 %
Eosinophils Absolute: 0.3 10*3/uL (ref 0.0–0.5)
Eosinophils Relative: 3 %
HCT: 45.4 % (ref 36.0–46.0)
Hemoglobin: 14.7 g/dL (ref 12.0–15.0)
Immature Granulocytes: 1 %
Lymphocytes Relative: 19 %
Lymphs Abs: 1.7 10*3/uL (ref 0.7–4.0)
MCH: 30 pg (ref 26.0–34.0)
MCHC: 32.4 g/dL (ref 30.0–36.0)
MCV: 92.7 fL (ref 80.0–100.0)
Monocytes Absolute: 1 10*3/uL (ref 0.1–1.0)
Monocytes Relative: 12 %
Neutro Abs: 5.8 10*3/uL (ref 1.7–7.7)
Neutrophils Relative %: 64 %
Platelets: 337 10*3/uL (ref 150–400)
RBC: 4.9 MIL/uL (ref 3.87–5.11)
RDW: 14.1 % (ref 11.5–15.5)
WBC: 8.8 10*3/uL (ref 4.0–10.5)
nRBC: 0 % (ref 0.0–0.2)

## 2020-02-17 LAB — COMPREHENSIVE METABOLIC PANEL
ALT: 27 U/L (ref 0–44)
AST: 28 U/L (ref 15–41)
Albumin: 4.2 g/dL (ref 3.5–5.0)
Alkaline Phosphatase: 57 U/L (ref 38–126)
Anion gap: 9 (ref 5–15)
BUN: 11 mg/dL (ref 8–23)
CO2: 26 mmol/L (ref 22–32)
Calcium: 9.8 mg/dL (ref 8.9–10.3)
Chloride: 104 mmol/L (ref 98–111)
Creatinine, Ser: 1.3 mg/dL — ABNORMAL HIGH (ref 0.44–1.00)
GFR, Estimated: 45 mL/min — ABNORMAL LOW (ref >60)
Glucose, Bld: 106 mg/dL — ABNORMAL HIGH (ref 70–99)
Potassium: 3.8 mmol/L (ref 3.5–5.1)
Sodium: 139 mmol/L (ref 135–145)
Total Bilirubin: 1 mg/dL (ref 0.3–1.2)
Total Protein: 8 g/dL (ref 6.5–8.1)

## 2020-02-17 LAB — URINALYSIS, ROUTINE W REFLEX MICROSCOPIC
Bilirubin Urine: NEGATIVE
Glucose, UA: >=500 mg/dL — AB
Hgb urine dipstick: NEGATIVE
Ketones, ur: 5 mg/dL — AB
Nitrite: NEGATIVE
Protein, ur: 100 mg/dL — AB
Specific Gravity, Urine: 1.035 — ABNORMAL HIGH (ref 1.005–1.030)
pH: 6 (ref 5.0–8.0)

## 2020-02-17 LAB — TYPE AND SCREEN
ABO/RH(D): B POS
Antibody Screen: NEGATIVE

## 2020-02-17 LAB — POC OCCULT BLOOD, ED: Fecal Occult Bld: POSITIVE — AB

## 2020-02-17 LAB — PROTIME-INR
INR: 0.9 (ref 0.8–1.2)
Prothrombin Time: 12.2 seconds (ref 11.4–15.2)

## 2020-02-17 MED ORDER — IOHEXOL 300 MG/ML  SOLN
75.0000 mL | Freq: Once | INTRAMUSCULAR | Status: AC | PRN
  Administered 2020-02-17: 75 mL via INTRAVENOUS

## 2020-02-17 MED ORDER — HYDROCORTISONE (PERIANAL) 2.5 % EX CREA: 1.0000 "application " | TOPICAL_CREAM | Freq: Two times a day (BID) | CUTANEOUS | 0 refills | Status: DC

## 2020-02-17 NOTE — ED Triage Notes (Signed)
Pt reports left sided abd pain x 1 month.  Reports diarrhea since Friday and has noticed bright red blood in stool.  Denies n/v or urinary symptoms.

## 2020-02-17 NOTE — Discharge Instructions (Addendum)
You will need to follow up with Dr. Alyson Ingles (urology) for the mass seen on your kidney. If the rectal bleeding continues, you will need to see Dr. Abbey Chatters (GI).

## 2020-02-17 NOTE — ED Provider Notes (Signed)
[facility] EMERGENCY DEPARTMENT Provider Note   CSN: [id number] Arrival date & time: 02/17/20  0857     History Chief Complaint  Patient presents with  . Abdominal Pain    Lindsey Buckley is a 69 y.o. female.  Pt presents to the ED today with LLQ abd pain with blood in stool.  Pt said she has had the pain for about 1 month.  She has not seen the doctor about it.  She noticed some blood in her stool on Friday, 11/12.  It was intermittent and occurred with wiping.  Stool has been slightly loose. She denies any n/v.  No f/c.        Past Medical History:  Diagnosis Date  . Asthma   . Chest pain    history of  . Diabetes mellitus without complication ([place])   . Eczema   . GERD (gastroesophageal reflux disease)   . Hyperlipidemia   . Other and unspecified hyperlipidemia   . Pulmonary congestion and hypostasis   . Pulmonary edema   . Unspecified essential hypertension     Patient Active Problem List   Diagnosis Date Noted  . Chest pain 05/16/2012  . HYPERLIPIDEMIA 12/30/2009  . GASTROESOPHAGEAL REFLUX DISEASE 12/30/2009  . Palpitations 12/30/2009  . HYPERTENSION 08/22/2008    Past Surgical History:  Procedure Laterality Date  . ABDOMINAL HYSTERECTOMY    . COLONOSCOPY    . TUBAL LIGATION       OB History   No obstetric history on file.     Family History  Problem Relation Age of Onset  . Uterine cancer Sister     Social History   Tobacco Use  . Smoking status: Former Smoker    Packs/day: 0.40    Types: Cigarettes    Quit date: 12/11/1999    Years since quitting: 20.2  . Smokeless tobacco: Never Used  Vaping Use  . Vaping Use: Never used  Substance Use Topics  . Alcohol use: No  . Drug use: No    Home Medications Prior to Admission medications   Medication Sig Start Date End Date Taking? Authorizing Provider  albuterol (PROVENTIL HFA;VENTOLIN HFA) 108 (90 BASE) MCG/ACT inhaler Inhale 2 puffs into the lungs every 4 (four) hours as needed.  07/28/14   Rolland Porter, MD  diphenhydrAMINE (BENADRYL) 25 mg capsule Take 50 mg by mouth every 6 (six) hours as needed.    [provider]  FARXIGA 10 MG TABS tablet TK 1 T PO QD 02/25/17   [provider]  hydrocortisone (ANUSOL-HC) 2.5 % rectal cream Place 1 application rectally 2 (two) times daily. 02/17/20   Isla Pence, MD  losartan (COZAAR) 25 MG tablet TAKE 1 TABLET(25 MG) BY MOUTH DAILY 01/15/20   Arnoldo Lenis, MD  metoprolol succinate (TOPROL-XL) 100 MG 24 hr tablet TAKE 1 TABLET(100 MG) BY MOUTH TWICE DAILY 10/16/19   Arnoldo Lenis, MD  mupirocin cream (BACTROBAN) 2 % Apply 1 application topically 2 (two) times daily. 01/18/20   Garald Balding, PA-C  NIFEdipine (ADALAT CC) 90 MG 24 hr tablet Take 90 mg by mouth daily.      [provider]  nitroGLYCERIN (NITROSTAT) 0.4 MG SL tablet Place 1 tablet (0.4 mg total) under the tongue every 5 (five) minutes as needed for chest pain. 01/20/16   Arnoldo Lenis, MD  omeprazole (PRILOSEC) 20 MG capsule Take 1 capsule (20 mg total) by mouth 2 (two) times daily. 05/16/12   Imogene Burn, PA-C  predniSONE (DELTASONE) 20 MG tablet Take 2 tablets (40 mg total) by mouth daily. 09/02/19   Triplett, Tammy, PA-C  rosuvastatin (CRESTOR) 40 MG tablet Take 20 mg by mouth daily. 05/03/11   Lendon Colonel, NP    Allergies    Ace inhibitors and Lisinopril  Review of Systems   Review of Systems  Gastrointestinal: Positive for abdominal pain and diarrhea.  All other systems reviewed and are negative.   Physical Exam Updated Vital Signs BP 140/61   Pulse 69   Temp 97.8 F (36.6 C) (Oral)   Resp 16   Ht 5\' 4"  (1.626 m)   Wt 81.6 kg   SpO2 96%   BMI 30.90 kg/m   Physical Exam Vitals and nursing note reviewed.  Constitutional:      Appearance: She is well-developed.  HENT:     Head: Normocephalic and atraumatic.     Mouth/Throat:     Mouth: Mucous membranes are moist.     Pharynx: Oropharynx is  clear.  Eyes:     Extraocular Movements: Extraocular movements intact.     Pupils: Pupils are equal, round, and reactive to light.  Cardiovascular:     Rate and Rhythm: Normal rate and regular rhythm.     Heart sounds: Normal heart sounds.  Pulmonary:     Effort: Pulmonary effort is normal.     Breath sounds: Normal breath sounds.  Abdominal:     General: Abdomen is flat.     Palpations: Abdomen is soft.     Tenderness: There is abdominal tenderness in the left lower quadrant.  Skin:    General: Skin is warm.     Capillary Refill: Capillary refill takes less than 2 seconds.  Neurological:     General: No focal deficit present.     Mental Status: She is alert and oriented to person, place, and time.  Psychiatric:        Mood and Affect: Mood normal.        Behavior: Behavior normal.     ED Results / Procedures / Treatments   Labs (all labs ordered are listed, but only abnormal results are displayed) Labs Reviewed  COMPREHENSIVE METABOLIC PANEL - Abnormal; Notable for the following components:      Result Value   Glucose, Bld 106 (*)    Creatinine, Ser 1.30 (*)    GFR, Estimated 45 (*)    All other components within normal limits  URINALYSIS, ROUTINE W REFLEX MICROSCOPIC - Abnormal; Notable for the following components:   Color, Urine STRAW (*)    Specific Gravity, Urine 1.035 (*)    Glucose, UA >=500 (*)    Ketones, ur 5 (*)    Protein, ur 100 (*)    Leukocytes,Ua TRACE (*)    Bacteria, UA RARE (*)    All other components within normal limits  POC OCCULT BLOOD, ED - Abnormal; Notable for the following components:   Fecal Occult Bld POSITIVE (*)    All other components within normal limits  CBC WITH DIFFERENTIAL/PLATELET  PROTIME-INR  TYPE AND SCREEN    EKG None  Radiology CT ABDOMEN PELVIS W CONTRAST  Result Date: 02/17/2020 CLINICAL DATA:  Left-sided abdominal pain for 1 month. Diarrhea since Friday EXAM: CT ABDOMEN AND PELVIS WITH CONTRAST TECHNIQUE:  Multidetector CT imaging of the abdomen and pelvis was performed using the standard protocol following bolus administration of intravenous contrast. CONTRAST:  25mL OMNIPAQUE IOHEXOL 300 MG/ML  SOLN COMPARISON:  None. FINDINGS: Lower chest:  Mild scarring at the lingula. Hepatobiliary: No focal liver abnormality.No evidence of biliary obstruction or stone. Pancreas: Unremarkable. Spleen: Unremarkable. Adrenals/Urinary Tract: Negative adrenals. Exophytic solid and cystic appearing mass from the lower pole right kidney measuring up to 4.4 cm. No visible internal fat. There are other renal lesions which are cystic density where large enough to measure. 1 cm right lower pole calculus. No hydronephrosis or ureteral stone. Unremarkable bladder. Stomach/Bowel: No obstruction. No appendicitis. No colonic wall thickening. Few left colonic diverticula. Vascular/Lymphatic: No acute vascular abnormality. No mass or adenopathy. Reproductive:Hysterectomy Other: No ascites or pneumoperitoneum. Musculoskeletal: No acute abnormalities. Spondylosis and degeneration of thoracolumbar disc spaces. IMPRESSION: 1. 4.4 cm solid and cystic mass from the right kidney, most concerning for renal cell carcinoma. Recommend urology referral and ambulatory renal MRI. 2. No explanation for left-sided pain and diarrhea. 3. Mild colonic diverticulosis. 4. Right nephrolithiasis. Electronically Signed   By: Monte Fantasia M.D.   On: 02/17/2020 11:29    Procedures Procedures (including critical care time)  Medications Ordered in ED Medications  iohexol (OMNIPAQUE) 300 MG/ML solution 75 mL (75 mLs Intravenous Contrast Given 02/17/20 1107)    ED Course  I have reviewed the triage vital signs and the nursing notes.  Pertinent labs & imaging results that were available during my care of the patient were reviewed by me and considered in my medical decision making (see chart for details).    MDM Rules/Calculators/A&P                            Renal mass seen on CT scan.  Pt d/w Dr. Alyson Ingles (urology) who will follow up with her as an outpatient.  Pt understands the importance of following up.  No cause for the abdominal pain.  Rectal bleeding seems minor. Hgb nl.  ? Internal hemorrhoid.  Pt is to f/u with GI.  She will be given anusol to help with bleeding if it's from a hemorrhoid.  She is to return if that worsens.  She is to f/u with pcp as well.   Final Clinical Impression(s) / ED Diagnoses Final diagnoses:  Rectal bleeding  Renal mass, right    Rx / DC Orders ED Discharge Orders         Ordered    hydrocortisone (ANUSOL-HC) 2.5 % rectal cream  2 times daily        02/17/20 1349           Isla Pence, MD 02/17/20 1353

## 2020-02-20 ENCOUNTER — Encounter: Payer: Self-pay | Admitting: Urology

## 2020-02-20 ENCOUNTER — Ambulatory Visit (INDEPENDENT_AMBULATORY_CARE_PROVIDER_SITE_OTHER): Payer: Medicare Other | Admitting: Urology

## 2020-02-20 ENCOUNTER — Other Ambulatory Visit: Payer: Self-pay

## 2020-02-20 VITALS — BP 172/92 | HR 86 | Temp 98.7°F | Ht 64.0 in | Wt 180.0 lb

## 2020-02-20 DIAGNOSIS — N2889 Other specified disorders of kidney and ureter: Secondary | ICD-10-CM

## 2020-02-20 LAB — MICROSCOPIC EXAMINATION
Bacteria, UA: NONE SEEN
Epithelial Cells (non renal): >10 /hpf — AB (ref 0–10)
Renal Epithel, UA: NONE SEEN /hpf

## 2020-02-20 LAB — URINALYSIS, ROUTINE W REFLEX MICROSCOPIC
Bilirubin, UA: NEGATIVE
Ketones, UA: NEGATIVE
Leukocytes,UA: NEGATIVE
Nitrite, UA: NEGATIVE
Specific Gravity, UA: 1.015 (ref 1.005–1.030)
Urobilinogen, Ur: 1 mg/dL (ref 0.2–1.0)
pH, UA: 5.5 (ref 5.0–7.5)

## 2020-02-20 NOTE — Patient Instructions (Signed)
Renal Mass  A renal mass is a growth in the kidney. A renal mass may be found while performing an MRI, CT scan, or ultrasound for other problems of the abdomen. Certain types of cancers, infections, or injuries can cause a renal mass. A renal mass that is cancerous (malignant) may grow or spread quickly. Others are harmless (benign). What are common types of renal masses? Renal masses include:  Tumors. These may be cancerous (malignant) or noncancerous (benign). ? The most common type of kidney cancer is renal cell carcinoma. ? The most common benign tumors of the kidney include renal adenomas, oncocytomas, and angiomyolipoma (AML).  Cysts. These are fluid-filled sacs that form on or in the kidney. ? It is not always known what causes a cyst to develop in or on the kidney. ? Most kidney cysts do not cause symptoms and do not need to be treated. What type of testing might I need? Your health care provider may recommend that you have tests to diagnose the cause of your renal mass. The following tests may be done if a renal mass is found:  Physical exam.  Blood tests.  Urine tests.  Imaging tests, such as ultrasound, CT scan, or MRI.  Biopsy. This is a small sample that is removed from the renal mass and tested in a lab. The exact tests and how often they are done will depend on:  The size and appearance of the renal mass.  Risk factors or medical conditions that increase your risk for problems.  Any symptoms associated with the renal mass, or concerns that you have about it. Tests and physical exams may be done once, or they may be done regularly for a period of time. Tests and exams that are done regularly will help monitor whether the mass is growing and beginning to cause problems. What are common treatments for renal masses? Treatment is not always needed for this condition. Your health care provider may recommend careful monitoring (watchful waiting) and regular tests and exams.  Treatment will depend on the cause of the mass. Follow these instructions at home: What you need to do at home will depend on the cause of the mass. Follow the instructions that your health care provider gives to you. In general:  Take over-the-counter and prescription medicines only as told by your health care provider.  If you are prescribed an antibiotic medicine, take it as told by your health care provider. Do not stop taking the antibiotic even if you start to feel better.  Follow any restrictions that are given to you by your health care provider.  Keep all follow-up visits as told by your health care provider. This is important. ? You may need to see your health care provider once or twice a year to have CT scans and ultrasounds done. These tests will show if your renal mass has changed or grown bigger. Contact a health care provider if you:  Have pain in the side or back (flank pain).  Have a fever.  Feel full soon after eating.  Have pain or swelling in the abdomen.  Lose weight. Get help right away if:  Your pain gets worse.  There is blood in your urine.  You cannot urinate.  You have chest pain.  You have trouble breathing. Summary  A renal mass is a growth in the kidney. It may be cancerous (malignant) and grow or spread quickly, or it may be harmless (benign).  Renal masses may be found while performing   an MRI, CT scan, or ultrasound for other problems of the abdomen.  Your health care provider may recommend that you have tests to diagnose the cause of your renal mass. This may include a physical exam, blood tests, urine tests, imaging, or a biopsy.  Treatment is not always needed for this condition. Careful monitoring (watchful waiting) may be recommended. This information is not intended to replace advice given to you by your health care provider. Make sure you discuss any questions you have with your health care provider. Document Revised: 04/27/2017  Document Reviewed: 04/27/2017 Elsevier Patient Education  2020 Elsevier Inc.  

## 2020-02-20 NOTE — Progress Notes (Signed)

## 2020-02-20 NOTE — Progress Notes (Signed)
02/20/2020 3:11 PM   Lindsey Buckley 06/05/1950 [id number]  Referring provider: Lucia Gaskins, MD [place],  [place] [address]  Right renal mass  HPI: [name] is a 69yo here for evaluation of a right right renal mass. She was having left abdominal pain and under CT on 02/17/2020. She was found to have a right 4cm mid pole exophytic cystic renal mass. No hematuria or LUTS. No family hx of RCC.   PMH: Past Medical History:  Diagnosis Date  . Asthma   . Chest pain    history of  . Diabetes mellitus without complication ([place])   . Eczema   . GERD (gastroesophageal reflux disease)   . Hyperlipidemia   . Other and unspecified hyperlipidemia   . Pulmonary congestion and hypostasis   . Pulmonary edema   . Unspecified essential hypertension     Surgical History: Past Surgical History:  Procedure Laterality Date  . ABDOMINAL HYSTERECTOMY    . COLONOSCOPY    . TUBAL LIGATION      Home Medications:  Allergies as of 02/20/2020      Reactions   Ace Inhibitors    Lisinopril       Medication List       Accurate as of February 20, 2020  3:11 PM. If you have any questions, ask your nurse or doctor.        albuterol 108 (90 Base) MCG/ACT inhaler Commonly known as: VENTOLIN HFA Inhale 2 puffs into the lungs every 4 (four) hours as needed.   augmented betamethasone dipropionate 0.05 % cream Commonly known as: DIPROLENE-AF Apply topically.   diphenhydrAMINE 25 mg capsule Commonly known as: BENADRYL Take 50 mg by mouth every 6 (six) hours as needed.   Farxiga 10 MG Tabs tablet Generic drug: dapagliflozin propanediol TK 1 T PO QD   hydrocortisone 2.5 % rectal cream Commonly known as: ANUSOL-HC Place 1 application rectally 2 (two) times daily.   hydrOXYzine 25 MG capsule Commonly known as: VISTARIL Take 25-50 mg by mouth at bedtime as needed.   losartan 25 MG tablet Commonly known as: COZAAR TAKE 1 TABLET(25 MG) BY MOUTH DAILY     metoprolol succinate 100 MG 24 hr tablet Commonly known as: TOPROL-XL TAKE 1 TABLET(100 MG) BY MOUTH TWICE DAILY   mupirocin cream 2 % Commonly known as: BACTROBAN Apply 1 application topically 2 (two) times daily.   NIFEdipine 90 MG 24 hr tablet Commonly known as: ADALAT CC Take 90 mg by mouth daily.   nitroGLYCERIN 0.4 MG SL tablet Commonly known as: Nitrostat Place 1 tablet (0.4 mg total) under the tongue every 5 (five) minutes as needed for chest pain.   omeprazole 20 MG capsule Commonly known as: PRILOSEC Take 1 capsule (20 mg total) by mouth 2 (two) times daily.   predniSONE 20 MG tablet Commonly known as: DELTASONE Take 2 tablets (40 mg total) by mouth daily.   rosuvastatin 40 MG tablet Commonly known as: CRESTOR Take 20 mg by mouth daily.       Allergies:  Allergies  Allergen Reactions  . Ace Inhibitors   . Lisinopril     Family History: Family History  Problem Relation Age of Onset  . Uterine cancer Sister     Social History:  reports that she quit smoking about 20 years ago. Her smoking use included cigarettes. She smoked 0.40 packs per day. She has never used smokeless tobacco. She reports that she does not drink alcohol and does not  use drugs.  ROS: All other review of systems were reviewed and are negative except what is noted above in HPI  Physical Exam: BP (!) 172/92   Pulse 86   Temp 98.7 F (37.1 C)   Ht 5\' 4"  (1.626 m)   Wt 180 lb (81.6 kg)   BMI 30.90 kg/m   Constitutional:  Alert and oriented, No acute distress. HEENT: [place] AT, moist mucus membranes.  Trachea midline, no masses. Cardiovascular: No clubbing, cyanosis, or edema. Respiratory: Normal respiratory effort, no increased work of breathing. GI: Abdomen is soft, nontender, nondistended, no abdominal masses GU: No CVA tenderness.  Lymph: No cervical or inguinal lymphadenopathy. Skin: No rashes, bruises or suspicious lesions. Neurologic: Grossly intact, no focal deficits,  moving all 4 extremities. Psychiatric: Normal mood and affect.  Laboratory Data: Lab Results  Component Value Date   WBC 8.8 02/17/2020   HGB 14.7 02/17/2020   HCT 45.4 02/17/2020   MCV 92.7 02/17/2020   PLT 337 02/17/2020    Lab Results  Component Value Date   CREATININE 1.30 (H) 02/17/2020    No results found for: PSA  No results found for: TESTOSTERONE  Lab Results  Component Value Date   HGBA1C 6.2 (H) 04/03/2017    Urinalysis    Component Value Date/Time   COLORURINE STRAW (A) 02/17/2020 1308   APPEARANCEUR Clear 02/20/2020 1447   LABSPEC 1.035 (H) 02/17/2020 1308   PHURINE 6.0 02/17/2020 1308   GLUCOSEU 3+ (A) 02/20/2020 1447   HGBUR NEGATIVE 02/17/2020 1308   BILIRUBINUR Negative 02/20/2020 1447   KETONESUR 5 (A) 02/17/2020 1308   PROTEINUR 3+ (A) 02/20/2020 1447   PROTEINUR 100 (A) 02/17/2020 1308   NITRITE Negative 02/20/2020 1447   NITRITE NEGATIVE 02/17/2020 1308   LEUKOCYTESUR Negative 02/20/2020 1447   LEUKOCYTESUR TRACE (A) 02/17/2020 1308    Lab Results  Component Value Date   LABMICR See below: 02/20/2020   WBCUA 0-5 02/20/2020   LABEPIT >10 (A) 02/20/2020   MUCUS Present 02/20/2020   BACTERIA None seen 02/20/2020    Pertinent Imaging: CT abd/pelvis 11/15: Images reviewed and discussed with the patient No results found for this or any previous visit.  No results found for this or any previous visit.  No results found for this or any previous visit.  No results found for this or any previous visit.  No results found for this or any previous visit.  No results found for this or any previous visit.  No results found for this or any previous visit.  No results found for this or any previous visit.   Assessment & Plan:    1. Kidney mass We discussed the natural hx of renal masses and the 80/20 malignant/benign likelihood. We disucssed the treatment options including active surveillance. Renal ablation, partial and radical  nephrectomy. After discussing the options the patient elects for partial nephrectomy. Risks/benefits/alternaitves discussed  - Urinalysis, Routine w reflex microscopic   No follow-ups on file.  Nicolette Bang, MD  [facility] [place]

## 2020-02-26 ENCOUNTER — Other Ambulatory Visit: Payer: Self-pay

## 2020-02-26 DIAGNOSIS — N2889 Other specified disorders of kidney and ureter: Secondary | ICD-10-CM

## 2020-02-26 MED ORDER — MAGNESIUM CITRATE PO SOLN: 0.5000 | Freq: Once | ORAL | 0 refills | Status: AC

## 2020-03-04 NOTE — Progress Notes (Signed)
Medical clearance printed and faxed to Dr. Cindie Laroche at [phone]

## 2020-03-05 ENCOUNTER — Telehealth: Payer: Self-pay | Admitting: Internal Medicine

## 2020-03-05 NOTE — Telephone Encounter (Signed)
Ok to schedule ov.  

## 2020-03-05 NOTE — Telephone Encounter (Signed)
ER REFERRAL  

## 2020-03-06 NOTE — Telephone Encounter (Signed)
PATIENT CALLED AND SCHEDULED

## 2020-03-10 ENCOUNTER — Encounter: Payer: Self-pay | Admitting: Gastroenterology

## 2020-03-10 ENCOUNTER — Ambulatory Visit (INDEPENDENT_AMBULATORY_CARE_PROVIDER_SITE_OTHER): Payer: Medicare Other | Admitting: Gastroenterology

## 2020-03-10 ENCOUNTER — Other Ambulatory Visit: Payer: Self-pay

## 2020-03-10 DIAGNOSIS — R195 Other fecal abnormalities: Secondary | ICD-10-CM | POA: Insufficient documentation

## 2020-03-10 DIAGNOSIS — R109 Unspecified abdominal pain: Secondary | ICD-10-CM | POA: Diagnosis not present

## 2020-03-10 DIAGNOSIS — R194 Change in bowel habit: Secondary | ICD-10-CM | POA: Diagnosis not present

## 2020-03-10 NOTE — H&P (View-Only) (Signed)
Primary Care Physician:  Lucia Gaskins, MD  Referring Physician: [name]  Primary Gastroenterologist:  Dr. Abbey Chatters  Chief Complaint  Patient presents with  . Abdominal Pain    comes/goes, left side  . Blood In Stools  . Diarrhea    daily, 3-4 times per day    HPI:   Lindsey Buckley is a 69 y.o. female presenting today at the request of [name] due to rectal bleeding and abdominal pain. She presented to the ED Feb 17, 2020. On CT, noted to have solid and cystic mass of right kidney, concerning for renal cell carcinoma. No other significant findings to explain left-sided abdominal pain. She is scheduled for partial nephrectomy with Dr. Alyson Ingles on 04/30/20.    Heme positive. CBC normal Nov 2021. [place] colonoscopy many years ago, more than 10. No polyps.   3 months ago started having left-sided pain, left upper located along ribs laterally. Got a new recliner and thought it was due to that. Intermittent. Some days no pain at all. Unable to pinpoint any triggers. Lasts about 3 minutes then goes away. 3-4 soft stools a day, usually every day. Started 3 months ago as well. No new meds. Has seen rectal bleeding intermittently. No rectal pain or discomfort. No weight loss. No family history of colon cancer or polyps.  Gets 3-4 cases of bottled of water for her home and lifts into house. Does yard work. Wonders if musculoskeletal.   Wants to hold off on prescriptive agents.   Past Medical History:  Diagnosis Date  . Asthma   . Chest pain    history of  . Diabetes mellitus without complication ([place])   . Eczema   . GERD (gastroesophageal reflux disease)   . Hyperlipidemia   . Other and unspecified hyperlipidemia   . Pulmonary congestion and hypostasis   . Pulmonary edema   . Unspecified essential hypertension     Past Surgical History:  Procedure Laterality Date  . ABDOMINAL HYSTERECTOMY    . COLONOSCOPY     remote past in [place]  . TUBAL  LIGATION      Current Outpatient Medications  Medication Sig Dispense Refill  . albuterol (PROVENTIL HFA;VENTOLIN HFA) 108 (90 BASE) MCG/ACT inhaler Inhale 2 puffs into the lungs every 4 (four) hours as needed. 6.7 g 0  . augmented betamethasone dipropionate (DIPROLENE-AF) 0.05 % cream Apply topically.    . diphenhydrAMINE (BENADRYL) 25 mg capsule Take 50 mg by mouth every 6 (six) hours as needed.    Marland Kitchen FARXIGA 10 MG TABS tablet TK 1 T PO QD  5  . hydrOXYzine (VISTARIL) 25 MG capsule Take 25-50 mg by mouth at bedtime as needed.    Marland Kitchen losartan (COZAAR) 25 MG tablet TAKE 1 TABLET(25 MG) BY MOUTH DAILY 30 tablet 0  . metoprolol succinate (TOPROL-XL) 100 MG 24 hr tablet TAKE 1 TABLET(100 MG) BY MOUTH TWICE DAILY 60 tablet 6  . mupirocin cream (BACTROBAN) 2 % Apply 1 application topically 2 (two) times daily. 15 g 0  . NIFEdipine (ADALAT CC) 90 MG 24 hr tablet Take 90 mg by mouth daily.      . rosuvastatin (CRESTOR) 40 MG tablet Take 20 mg by mouth daily.     No current facility-administered medications for this visit.    Allergies as of 03/10/2020 - Review Complete 03/10/2020  Allergen Reaction Noted  . Ace inhibitors    . Lisinopril  Family History  Problem Relation Age of Onset  . Uterine cancer Sister   . Colon cancer Neg Hx   . Colon polyps Neg Hx     Social History   Socioeconomic History  . Marital status: Single    Spouse name: Not on file  . Number of children: 3  . Years of education: Not on file  . Highest education level: Not on file  Occupational History  . Occupation: Full time  Tobacco Use  . Smoking status: Former Smoker    Packs/day: 0.40    Types: Cigarettes    Quit date: 12/11/1999    Years since quitting: 20.2  . Smokeless tobacco: Never Used  Vaping Use  . Vaping Use: Never used  Substance and Sexual Activity  . Alcohol use: No  . Drug use: No  . Sexual activity: Not Currently  Other Topics Concern  . Not on file  Social History Narrative    Divorced   3 children   No regular exercise   Social Determinants of Health   Financial Resource Strain:   . Difficulty of Paying Living Expenses: Not on file  Food Insecurity:   . Worried About Charity fundraiser in the Last Year: Not on file  . Ran Out of Food in the Last Year: Not on file  Transportation Needs:   . Lack of Transportation (Medical): Not on file  . Lack of Transportation (Non-Medical): Not on file  Physical Activity:   . Days of Exercise per Week: Not on file  . Minutes of Exercise per Session: Not on file  Stress:   . Feeling of Stress : Not on file  Social Connections:   . Frequency of Communication with Friends and Family: Not on file  . Frequency of Social Gatherings with Friends and Family: Not on file  . Attends Religious Services: Not on file  . Active Member of Clubs or Organizations: Not on file  . Attends Archivist Meetings: Not on file  . Marital Status: Not on file  Intimate Partner Violence:   . Fear of Current or Ex-Partner: Not on file  . Emotionally Abused: Not on file  . Physically Abused: Not on file  . Sexually Abused: Not on file    Review of Systems: Gen: Denies any fever, chills, fatigue, weight loss, lack of appetite.  CV: Denies chest pain, heart palpitations, peripheral edema, syncope.  Resp: Denies shortness of breath at rest or with exertion. Denies wheezing or cough.  GI: see HPI GU : Denies urinary burning, urinary frequency, urinary hesitancy MS: Denies joint pain, muscle weakness, cramps, or limitation of movement.  Derm: Denies rash, itching, dry skin Psych: Denies depression, anxiety, memory loss, and confusion Heme: see HPI  Physical Exam: BP (!) 162/92 (BP Location: Left Arm, Cuff Size: Normal)   Pulse 78   Temp 97.7 F (36.5 C)   Ht 5\' 4"  (1.626 m)   Wt 192 lb 9.6 oz (87.4 kg)   BMI 33.06 kg/m  General:   Alert and oriented. Pleasant and cooperative. Well-nourished and well-developed.  Head:   Normocephalic and atraumatic. Eyes:  Without icterus, sclera clear and conjunctiva pink.  Ears:  Normal auditory acuity. Mouth:  No deformity or lesions, oral mucosa pink.  Lungs:  Clear to auscultation bilaterally. No wheezes, rales, or rhonchi. No distress.  Heart:  S1, S2 present without murmurs appreciated.  Abdomen:  +BS, soft, non-tender and non-distended. No HSM noted. No guarding or rebound. No masses  appreciated.  Rectal:  Deferred  Msk:  Symmetrical without gross deformities. Normal posture. Extremities:  Without edema. Neurologic:  Alert and  oriented x4;  grossly normal neurologically. Skin:  Intact without significant lesions or rashes. Psych:  Alert and cooperative. Normal mood and affect.  ASSESSMENT: Lindsey Buckley is a 69 y.o. female presenting today with 3 month history of painless rectal bleeding, left-sided abdominal pain unrelated to BMs, no exacerbating triggers, heme positive stool, and new findings of solid and cystic mass of right kidney noted at time of ED presentation 02/17/2020.  Last colonoscopy greater than 10 years ago in [place]. No family history of colorectal cancer or polyps. She notes more frequent stools but denying diarrhea, so we will hold off on stool studies. She is declining supportive therapy such as anti-spasmodic.   Diagnostic colonoscopy planned in near future due to heme positive stool, rectal bleeding. She has upcoming partial nephrectomy scheduled for Apr 30, 2020, with Dr. Alyson Ingles. She is to call if she would like to trial Bentyl or Levsin, and she will also call if any diarrhea.    PLAN: Proceed with colonoscopy by Dr. Abbey Chatters  in near future: the risks, benefits, and alternatives have been discussed with the patient in detail. The patient states understanding and desires to proceed.   Call if diarrhea  Further recommendations to follow  Annitta Needs, PhD, ANP-BC [facility]enter[facility]

## 2020-03-10 NOTE — Patient Instructions (Addendum)
We are arranging a colonoscopy in the near future.  Please call if pain worsens or you start having watery stools.  Further recommendations to follow!  It was a pleasure to see you today. I want to create trusting relationships with patients to provide genuine, compassionate, and quality care. I value your feedback. If you receive a survey regarding your visit,  I greatly appreciate you taking time to fill this out.   Annitta Needs, PhD, ANP-BC [facility]astroenter[facility]

## 2020-03-10 NOTE — Progress Notes (Signed)
Primary Care Physician:  Lucia Gaskins, MD  Referring Physician: [name]  Primary Gastroenterologist:  Dr. Abbey Chatters  Chief Complaint  Patient presents with  . Abdominal Pain    comes/goes, left side  . Blood In Stools  . Diarrhea    daily, 3-4 times per day    HPI:   Lindsey Buckley is a 69 y.o. female presenting today at the request of [name] due to rectal bleeding and abdominal pain. She presented to the ED Feb 17, 2020. On CT, noted to have solid and cystic mass of right kidney, concerning for renal cell carcinoma. No other significant findings to explain left-sided abdominal pain. She is scheduled for partial nephrectomy with Dr. Alyson Ingles on 04/30/20.    Heme positive. CBC normal Nov 2021. [place] colonoscopy many years ago, more than 10. No polyps.   3 months ago started having left-sided pain, left upper located along ribs laterally. Got a new recliner and thought it was due to that. Intermittent. Some days no pain at all. Unable to pinpoint any triggers. Lasts about 3 minutes then goes away. 3-4 soft stools a day, usually every day. Started 3 months ago as well. No new meds. Has seen rectal bleeding intermittently. No rectal pain or discomfort. No weight loss. No family history of colon cancer or polyps.  Gets 3-4 cases of bottled of water for her home and lifts into house. Does yard work. Wonders if musculoskeletal.   Wants to hold off on prescriptive agents.   Past Medical History:  Diagnosis Date  . Asthma   . Chest pain    history of  . Diabetes mellitus without complication ([place])   . Eczema   . GERD (gastroesophageal reflux disease)   . Hyperlipidemia   . Other and unspecified hyperlipidemia   . Pulmonary congestion and hypostasis   . Pulmonary edema   . Unspecified essential hypertension     Past Surgical History:  Procedure Laterality Date  . ABDOMINAL HYSTERECTOMY    . COLONOSCOPY     remote past in [place]  . TUBAL  LIGATION      Current Outpatient Medications  Medication Sig Dispense Refill  . albuterol (PROVENTIL HFA;VENTOLIN HFA) 108 (90 BASE) MCG/ACT inhaler Inhale 2 puffs into the lungs every 4 (four) hours as needed. 6.7 g 0  . augmented betamethasone dipropionate (DIPROLENE-AF) 0.05 % cream Apply topically.    . diphenhydrAMINE (BENADRYL) 25 mg capsule Take 50 mg by mouth every 6 (six) hours as needed.    Marland Kitchen FARXIGA 10 MG TABS tablet TK 1 T PO QD  5  . hydrOXYzine (VISTARIL) 25 MG capsule Take 25-50 mg by mouth at bedtime as needed.    Marland Kitchen losartan (COZAAR) 25 MG tablet TAKE 1 TABLET(25 MG) BY MOUTH DAILY 30 tablet 0  . metoprolol succinate (TOPROL-XL) 100 MG 24 hr tablet TAKE 1 TABLET(100 MG) BY MOUTH TWICE DAILY 60 tablet 6  . mupirocin cream (BACTROBAN) 2 % Apply 1 application topically 2 (two) times daily. 15 g 0  . NIFEdipine (ADALAT CC) 90 MG 24 hr tablet Take 90 mg by mouth daily.      . rosuvastatin (CRESTOR) 40 MG tablet Take 20 mg by mouth daily.     No current facility-administered medications for this visit.    Allergies as of 03/10/2020 - Review Complete 03/10/2020  Allergen Reaction Noted  . Ace inhibitors    . Lisinopril  Family History  Problem Relation Age of Onset  . Uterine cancer Sister   . Colon cancer Neg Hx   . Colon polyps Neg Hx     Social History   Socioeconomic History  . Marital status: Single    Spouse name: Not on file  . Number of children: 3  . Years of education: Not on file  . Highest education level: Not on file  Occupational History  . Occupation: Full time  Tobacco Use  . Smoking status: Former Smoker    Packs/day: 0.40    Types: Cigarettes    Quit date: 12/11/1999    Years since quitting: 20.2  . Smokeless tobacco: Never Used  Vaping Use  . Vaping Use: Never used  Substance and Sexual Activity  . Alcohol use: No  . Drug use: No  . Sexual activity: Not Currently  Other Topics Concern  . Not on file  Social History Narrative    Divorced   3 children   No regular exercise   Social Determinants of Health   Financial Resource Strain:   . Difficulty of Paying Living Expenses: Not on file  Food Insecurity:   . Worried About Charity fundraiser in the Last Year: Not on file  . Ran Out of Food in the Last Year: Not on file  Transportation Needs:   . Lack of Transportation (Medical): Not on file  . Lack of Transportation (Non-Medical): Not on file  Physical Activity:   . Days of Exercise per Week: Not on file  . Minutes of Exercise per Session: Not on file  Stress:   . Feeling of Stress : Not on file  Social Connections:   . Frequency of Communication with Friends and Family: Not on file  . Frequency of Social Gatherings with Friends and Family: Not on file  . Attends Religious Services: Not on file  . Active Member of Clubs or Organizations: Not on file  . Attends Archivist Meetings: Not on file  . Marital Status: Not on file  Intimate Partner Violence:   . Fear of Current or Ex-Partner: Not on file  . Emotionally Abused: Not on file  . Physically Abused: Not on file  . Sexually Abused: Not on file    Review of Systems: Gen: Denies any fever, chills, fatigue, weight loss, lack of appetite.  CV: Denies chest pain, heart palpitations, peripheral edema, syncope.  Resp: Denies shortness of breath at rest or with exertion. Denies wheezing or cough.  GI: see HPI GU : Denies urinary burning, urinary frequency, urinary hesitancy MS: Denies joint pain, muscle weakness, cramps, or limitation of movement.  Derm: Denies rash, itching, dry skin Psych: Denies depression, anxiety, memory loss, and confusion Heme: see HPI  Physical Exam: BP (!) 162/92 (BP Location: Left Arm, Cuff Size: Normal)   Pulse 78   Temp 97.7 F (36.5 C)   Ht 5\' 4"  (1.626 m)   Wt 192 lb 9.6 oz (87.4 kg)   BMI 33.06 kg/m  General:   Alert and oriented. Pleasant and cooperative. Well-nourished and well-developed.  Head:   Normocephalic and atraumatic. Eyes:  Without icterus, sclera clear and conjunctiva pink.  Ears:  Normal auditory acuity. Mouth:  No deformity or lesions, oral mucosa pink.  Lungs:  Clear to auscultation bilaterally. No wheezes, rales, or rhonchi. No distress.  Heart:  S1, S2 present without murmurs appreciated.  Abdomen:  +BS, soft, non-tender and non-distended. No HSM noted. No guarding or rebound. No masses  appreciated.  Rectal:  Deferred  Msk:  Symmetrical without gross deformities. Normal posture. Extremities:  Without edema. Neurologic:  Alert and  oriented x4;  grossly normal neurologically. Skin:  Intact without significant lesions or rashes. Psych:  Alert and cooperative. Normal mood and affect.  ASSESSMENT: Lindsey Buckley is a 69 y.o. female presenting today with 3 month history of painless rectal bleeding, left-sided abdominal pain unrelated to BMs, no exacerbating triggers, heme positive stool, and new findings of solid and cystic mass of right kidney noted at time of ED presentation 02/17/2020.  Last colonoscopy greater than 10 years ago in [place]. No family history of colorectal cancer or polyps. She notes more frequent stools but denying diarrhea, so we will hold off on stool studies. She is declining supportive therapy such as anti-spasmodic.   Diagnostic colonoscopy planned in near future due to heme positive stool, rectal bleeding. She has upcoming partial nephrectomy scheduled for Apr 30, 2020, with Dr. Alyson Ingles. She is to call if she would like to trial Bentyl or Levsin, and she will also call if any diarrhea.    PLAN: Proceed with colonoscopy by Dr. Abbey Chatters  in near future: the risks, benefits, and alternatives have been discussed with the patient in detail. The patient states understanding and desires to proceed.   Call if diarrhea  Further recommendations to follow  Annitta Needs, PhD, ANP-BC [facility]

## 2020-03-13 ENCOUNTER — Telehealth: Payer: Self-pay

## 2020-03-13 NOTE — Telephone Encounter (Signed)
Dr. Cindie Laroche office today regarding surgical clearance needed for pt surgery on 04/30/2020  Their office to attempting to reach patient to schedule office visit for pre op clearance

## 2020-03-15 ENCOUNTER — Other Ambulatory Visit: Payer: Self-pay | Admitting: Cardiology

## 2020-03-16 ENCOUNTER — Other Ambulatory Visit: Payer: Self-pay | Admitting: Cardiology

## 2020-03-16 NOTE — Progress Notes (Signed)
Cc'ed to pcp °

## 2020-03-25 ENCOUNTER — Telehealth: Payer: Self-pay

## 2020-03-25 NOTE — Telephone Encounter (Signed)
Patient is scheduled with PCP for surgical clearance on 04/07/2020

## 2020-04-01 NOTE — Patient Instructions (Signed)
Lindsey Buckley  04/01/2020     @PREFPERIOPPHARMACY @   Your procedure is scheduled on 04/07/2020.  Report to Lindsey Buckley at 12:00  Noon   Call this number if you have problems the morning of surgery:  [phone]   Remember:  Do not eat or drink after midnight.    Please follow the diet and prep instructions given to you by Dr Ave Filter office.     Please do not take any diabetic medication the am of the procedure.      Take these medicines the morning of surgery with A SIP OF WATER : Metoprolol and Procardia   Please use your inhaler before coming to the hospital    Do not wear jewelry, make-up or nail polish.  Do not wear lotions, powders, or perfumes, or deodorant.  Do not shave 48 hours prior to surgery.  Men may shave face and neck.  Do not bring valuables to the hospital.  [facility] is not responsible for any belongings or valuables.  Contacts, dentures or bridgework may not be worn into surgery.  Leave your suitcase in the car.  After surgery it may be brought to your room.  For patients admitted to the hospital, discharge time will be determined by your treatment team.  Patients discharged the day of surgery will not be allowed to drive home.   Name and phone number of your driver:   family Special instructions:  N/A  Please read over the following fact sheets that you were given. Care and Recovery After Surgery  Colonoscopy, Adult A colonoscopy is a procedure to look at the entire large intestine. This procedure is done using a long, thin, flexible tube that has a camera on the end. You may have a colonoscopy:  As a part of normal colorectal screening.  If you have certain symptoms, such as: ? A low number of red blood cells in your blood (anemia). ? Diarrhea that does not go away. ? Pain in your abdomen. ? Blood in your stool. A colonoscopy can help screen for and diagnose medical problems, including:  Tumors.  Extra tissue that grows where  mucus forms (polyps).  Inflammation.  Areas of bleeding. Tell your health care provider about:  Any allergies you have.  All medicines you are taking, including vitamins, herbs, eye drops, creams, and over-the-counter medicines.  Any problems you or family members have had with anesthetic medicines.  Any blood disorders you have.  Any surgeries you have had.  Any medical conditions you have.  Any problems you have had with having bowel movements.  Whether you are pregnant or may be pregnant. What are the risks? Generally, this is a safe procedure. However, problems may occur, including:  Bleeding.  Damage to your intestine.  Allergic reactions to medicines given during the procedure.  Infection. This is rare. What happens before the procedure? Eating and drinking restrictions Follow instructions from your health care provider about eating or drinking restrictions, which may include:  A few days before the procedure: ? Follow a low-fiber diet. ? Avoid nuts, seeds, dried fruit, raw fruits, and vegetables.  1-3 days before the procedure: ? Eat only gelatin dessert or ice pops. ? Drink only clear liquids, such as water, clear juice, clear broth or bouillon, black coffee or tea, or clear soft drinks or sports drinks. ? Avoid liquids that contain red or purple dye.  The day of the procedure: ? Do not eat solid foods. You may continue to drink  clear liquids until up to 2 hours before the procedure. ? Do not eat or drink anything starting 2 hours before the procedure, or within the time period that your health care provider recommends. Bowel prep If you were prescribed a bowel prep to take by mouth (orally) to clean out your colon:  Take it as told by your health care provider. Starting the day before your procedure, you will need to drink a large amount of liquid medicine. The liquid will cause you to have many bowel movements of loose stool until your stool becomes almost  clear or light green.  If your skin or the opening between the buttocks (anus) gets irritated from diarrhea, you may relieve the irritation using: ? Wipes with medicine in them, such as adult wet wipes with aloe and vitamin E. ? A product to soothe skin, such as petroleum jelly.  If you vomit while drinking the bowel prep: ? Take a break for up to 60 minutes. ? Begin the bowel prep again. ? Call your health care provider if you keep vomiting or you cannot take the bowel prep without vomiting.  To clean out your colon, you may also be given: ? Laxative medicines. These help you have a bowel movement. ? Instructions for enema use. An enema is liquid medicine injected into your rectum. Medicines Ask your health care provider about:  Changing or stopping your regular medicines or supplements. This is especially important if you are taking iron supplements, diabetes medicines, or blood thinners.  Taking medicines such as aspirin and ibuprofen. These medicines can thin your blood. Do not take these medicines unless your health care provider tells you to take them.  Taking over-the-counter medicines, vitamins, herbs, and supplements. General instructions  Ask your health care provider what steps will be taken to help prevent infection. These may include washing skin with a germ-killing soap.  Plan to have someone take you home from the hospital or clinic. What happens during the procedure?   An IV will be inserted into one of your veins.  You may be given one or more of the following: ? A medicine to help you relax (sedative). ? A medicine to numb the area (local anesthetic). ? A medicine to make you fall asleep (general anesthetic). This is rarely needed.  You will lie on your side with your knees bent.  The tube will: ? Have oil or gel put on it (be lubricated). ? Be inserted into your anus. ? Be gently eased through all parts of your large intestine.  Air will be sent into  your colon to keep it open. This may cause some pressure or cramping.  Images will be taken with the camera and will appear on a screen.  A small tissue sample may be removed to be looked at under a microscope (biopsy). The tissue may be sent to a lab for testing if any signs of problems are found.  If small polyps are found, they may be removed and checked for cancer cells.  When the procedure is finished, the tube will be removed. The procedure may vary among health care providers and hospitals. What happens after the procedure?  Your blood pressure, heart rate, breathing rate, and blood oxygen level will be monitored until you leave the hospital or clinic.  You may have a small amount of blood in your stool.  You may pass gas and have mild cramping or bloating in your abdomen. This is caused by the air that was  used to open your colon during the exam.  Do not drive for 24 hours after the procedure.  It is up to you to get the results of your procedure. Ask your health care provider, or the department that is doing the procedure, when your results will be ready. Summary  A colonoscopy is a procedure to look at the entire large intestine.  Follow instructions from your health care provider about eating and drinking before the procedure.  If you were prescribed an oral bowel prep to clean out your colon, take it as told by your health care provider.  During the colonoscopy, a flexible tube with a camera on its end is inserted into the anus and then passed into the other parts of the large intestine. This information is not intended to replace advice given to you by your health care provider. Make sure you discuss any questions you have with your health care provider. Document Revised: 10/12/2018 Document Reviewed: 10/12/2018 Elsevier Patient Education  2020 [place] Anesthesia is a term that refers to techniques, procedures, and medicines that  help a person stay safe and comfortable during a medical procedure. Monitored anesthesia care, or sedation, is one type of anesthesia. Your anesthesia specialist may recommend sedation if you will be having a procedure that does not require you to be unconscious, such as:  Cataract surgery.  A dental procedure.  A biopsy.  A colonoscopy. During the procedure, you may receive a medicine to help you relax (sedative). There are three levels of sedation:  Mild sedation. At this level, you may feel awake and relaxed. You will be able to follow directions.  Moderate sedation. At this level, you will be sleepy. You may not remember the procedure.  Deep sedation. At this level, you will be asleep. You will not remember the procedure. The more medicine you are given, the deeper your level of sedation will be. Depending on how you respond to the procedure, the anesthesia specialist may change your level of sedation or the type of anesthesia to fit your needs. An anesthesia specialist will monitor you closely during the procedure. Let your health care provider know about:  Any allergies you have.  All medicines you are taking, including vitamins, herbs, eye drops, creams, and over-the-counter medicines.  Any use of steroids (by mouth or as a cream).  Any problems you or family members have had with sedatives and anesthetic medicines.  Any blood disorders you have.  Any surgeries you have had.  Any medical conditions you have, such as sleep apnea.  Whether you are pregnant or may be pregnant.  Any use of cigarettes, alcohol, or street drugs. What are the risks? Generally, this is a safe procedure. However, problems may occur, including:  Getting too much medicine (oversedation).  Nausea.  Allergic reaction to medicines.  Trouble breathing. If this happens, a breathing tube may be used to help with breathing. It will be removed when you are awake and breathing on your own.  Heart  trouble.  Lung trouble. Before the procedure Staying hydrated Follow instructions from your health care provider about hydration, which may include:  Up to 2 hours before the procedure - you may continue to drink clear liquids, such as water, clear fruit juice, black coffee, and plain tea. Eating and drinking restrictions Follow instructions from your health care provider about eating and drinking, which may include:  8 hours before the procedure - stop eating heavy meals or foods such  as meat, fried foods, or fatty foods.  6 hours before the procedure - stop eating light meals or foods, such as toast or cereal.  6 hours before the procedure - stop drinking milk or drinks that contain milk.  2 hours before the procedure - stop drinking clear liquids. Medicines Ask your health care provider about:  Changing or stopping your regular medicines. This is especially important if you are taking diabetes medicines or blood thinners.  Taking medicines such as aspirin and ibuprofen. These medicines can thin your blood. Do not take these medicines before your procedure if your health care provider instructs you not to. Tests and exams  You will have a physical exam.  You may have blood tests done to show: ? How well your kidneys and liver are working. ? How well your blood can clot. General instructions  Plan to have someone take you home from the hospital or clinic.  If you will be going home right after the procedure, plan to have someone with you for 24 hours.  What happens during the procedure?  Your blood pressure, heart rate, breathing, level of pain and overall condition will be monitored.  An IV tube will be inserted into one of your veins.  Your anesthesia specialist will give you medicines as needed to keep you comfortable during the procedure. This may mean changing the level of sedation.  The procedure will be performed. After the procedure  Your blood pressure, heart  rate, breathing rate, and blood oxygen level will be monitored until the medicines you were given have worn off.  Do not drive for 24 hours if you received a sedative.  You may: ? Feel sleepy, clumsy, or nauseous. ? Feel forgetful about what happened after the procedure. ? Have a sore throat if you had a breathing tube during the procedure. ? Vomit. This information is not intended to replace advice given to you by your health care provider. Make sure you discuss any questions you have with your health care provider. Document Revised: 03/03/2017 Document Reviewed: 07/12/2015 Elsevier Patient Education  [place].

## 2020-04-02 ENCOUNTER — Other Ambulatory Visit: Payer: Self-pay

## 2020-04-02 ENCOUNTER — Encounter (HOSPITAL_COMMUNITY): Payer: Self-pay

## 2020-04-02 ENCOUNTER — Encounter (HOSPITAL_COMMUNITY)
Admission: RE | Admit: 2020-04-02 | Discharge: 2020-04-02 | Disposition: A | Discharge status: Home / Self Care | Payer: Medicare Other | Source: Ambulatory Visit | Attending: Internal Medicine | Admitting: Internal Medicine

## 2020-04-02 ENCOUNTER — Telehealth: Payer: Self-pay | Admitting: Internal Medicine

## 2020-04-02 ENCOUNTER — Other Ambulatory Visit (HOSPITAL_COMMUNITY)
Admission: RE | Admit: 2020-04-02 | Discharge: 2020-04-02 | Disposition: A | Discharge status: Home / Self Care | Payer: Medicare Other | Source: Ambulatory Visit | Attending: Internal Medicine | Admitting: Internal Medicine

## 2020-04-02 DIAGNOSIS — Z01818 Encounter for other preprocedural examination: Secondary | ICD-10-CM | POA: Insufficient documentation

## 2020-04-02 DIAGNOSIS — Z20822 Contact with and (suspected) exposure to covid-19: Secondary | ICD-10-CM | POA: Diagnosis not present

## 2020-04-02 LAB — BASIC METABOLIC PANEL
Anion gap: 11 (ref 5–15)
BUN: 19 mg/dL (ref 8–23)
CO2: 24 mmol/L (ref 22–32)
Calcium: 10.2 mg/dL (ref 8.9–10.3)
Chloride: 104 mmol/L (ref 98–111)
Creatinine, Ser: 1.39 mg/dL — ABNORMAL HIGH (ref 0.44–1.00)
GFR, Estimated: 41 mL/min — ABNORMAL LOW (ref >60)
Glucose, Bld: 114 mg/dL — ABNORMAL HIGH (ref 70–99)
Potassium: 3.7 mmol/L (ref 3.5–5.1)
Sodium: 139 mmol/L (ref 135–145)

## 2020-04-02 LAB — SARS CORONAVIRUS 2 (TAT 6-24 HRS): SARS Coronavirus 2: NEGATIVE

## 2020-04-02 NOTE — Telephone Encounter (Signed)
Called pt, question answered. ?

## 2020-04-02 NOTE — Telephone Encounter (Signed)
Please call patient [phone] regarding her prep. She is scheduled for 04/07/2020 with Dr Abbey Chatters.

## 2020-04-07 ENCOUNTER — Ambulatory Visit (HOSPITAL_COMMUNITY): Payer: Medicare Other | Admitting: Anesthesiology

## 2020-04-07 ENCOUNTER — Other Ambulatory Visit: Payer: Self-pay | Admitting: Cardiology

## 2020-04-07 ENCOUNTER — Ambulatory Visit (HOSPITAL_COMMUNITY)
Admission: RE | Admit: 2020-04-07 | Discharge: 2020-04-07 | Disposition: A | Discharge status: Home / Self Care | Payer: Medicare Other | Attending: Internal Medicine | Admitting: Internal Medicine

## 2020-04-07 ENCOUNTER — Encounter (HOSPITAL_COMMUNITY): Payer: Self-pay

## 2020-04-07 ENCOUNTER — Encounter (HOSPITAL_COMMUNITY)
Admission: RE | Disposition: A | Discharge status: Home / Self Care | Payer: Self-pay | Source: Home / Self Care | Attending: Internal Medicine

## 2020-04-07 DIAGNOSIS — K921 Melena: Secondary | ICD-10-CM | POA: Diagnosis not present

## 2020-04-07 DIAGNOSIS — K648 Other hemorrhoids: Secondary | ICD-10-CM | POA: Insufficient documentation

## 2020-04-07 DIAGNOSIS — K635 Polyp of colon: Secondary | ICD-10-CM | POA: Diagnosis not present

## 2020-04-07 DIAGNOSIS — D127 Benign neoplasm of rectosigmoid junction: Secondary | ICD-10-CM | POA: Diagnosis not present

## 2020-04-07 DIAGNOSIS — K5730 Diverticulosis of large intestine without perforation or abscess without bleeding: Secondary | ICD-10-CM | POA: Insufficient documentation

## 2020-04-07 DIAGNOSIS — Z87891 Personal history of nicotine dependence: Secondary | ICD-10-CM | POA: Insufficient documentation

## 2020-04-07 DIAGNOSIS — D122 Benign neoplasm of ascending colon: Secondary | ICD-10-CM | POA: Insufficient documentation

## 2020-04-07 DIAGNOSIS — K625 Hemorrhage of anus and rectum: Secondary | ICD-10-CM

## 2020-04-07 DIAGNOSIS — D123 Benign neoplasm of transverse colon: Secondary | ICD-10-CM | POA: Diagnosis not present

## 2020-04-07 DIAGNOSIS — Z7984 Long term (current) use of oral hypoglycemic drugs: Secondary | ICD-10-CM | POA: Diagnosis not present

## 2020-04-07 DIAGNOSIS — Z79899 Other long term (current) drug therapy: Secondary | ICD-10-CM | POA: Insufficient documentation

## 2020-04-07 HISTORY — PX: POLYPECTOMY: SHX5525

## 2020-04-07 HISTORY — PX: COLONOSCOPY WITH PROPOFOL: SHX5780

## 2020-04-07 LAB — GLUCOSE, CAPILLARY
Glucose-Capillary: 85 mg/dL (ref 70–99)
Glucose-Capillary: 73 mg/dL (ref 70–99)

## 2020-04-07 SURGERY — COLONOSCOPY WITH PROPOFOL
Anesthesia: General

## 2020-04-07 MED ORDER — PROPOFOL 10 MG/ML IV BOLUS
INTRAVENOUS | Status: DC | PRN
  Administered 2020-04-07: 50 mg via INTRAVENOUS

## 2020-04-07 MED ORDER — PROPOFOL 500 MG/50ML IV EMUL
INTRAVENOUS | Status: DC | PRN
  Administered 2020-04-07: 150 ug/kg/min via INTRAVENOUS

## 2020-04-07 MED ORDER — STERILE WATER FOR IRRIGATION IR SOLN
Status: DC | PRN
  Administered 2020-04-07: 100 mL

## 2020-04-07 MED ORDER — LACTATED RINGERS IV SOLN
INTRAVENOUS | Status: DC
  Administered 2020-04-07: 1000 mL via INTRAVENOUS

## 2020-04-07 MED ORDER — PROPOFOL 10 MG/ML IV BOLUS
INTRAVENOUS | Status: AC
  Filled 2020-04-07: qty 20

## 2020-04-07 MED ORDER — LACTATED RINGERS IV SOLN: INTRAVENOUS | Status: DC | PRN

## 2020-04-07 NOTE — Addendum Note (Signed)
Addendum  created 04/07/20 1001 by Jonna Munro, CRNA   Intraprocedure Event edited

## 2020-04-07 NOTE — Anesthesia Preprocedure Evaluation (Signed)
Anesthesia Evaluation  Patient identified by MRN, date of birth, ID band Patient awake    Reviewed: Allergy & Precautions, H&P , NPO status , Patient's Chart, lab work & pertinent test results, reviewed documented beta blocker date and time   Airway Mallampati: II  TM Distance: >3 FB Neck ROM: full    Dental no notable dental hx.    Pulmonary asthma , former smoker,    Pulmonary exam normal breath sounds clear to auscultation       Cardiovascular Exercise Tolerance: Good hypertension, negative cardio ROS   Rhythm:regular Rate:Normal     Neuro/Psych negative neurological ROS  negative psych ROS   GI/Hepatic Neg liver ROS, GERD  Medicated,  Endo/Other  negative endocrine ROSdiabetes  Renal/GU negative Renal ROS  negative genitourinary   Musculoskeletal   Abdominal   Peds  Hematology negative hematology ROS (+)   Anesthesia Other Findings   Reproductive/Obstetrics negative OB ROS                             Anesthesia Physical Anesthesia Plan  ASA: III  Anesthesia Plan: General   Post-op Pain Management:    Induction:   PONV Risk Score and Plan: Propofol infusion  Airway Management Planned:   Additional Equipment:   Intra-op Plan:   Post-operative Plan:   Informed Consent: I have reviewed the patients History and Physical, chart, labs and discussed the procedure including the risks, benefits and alternatives for the proposed anesthesia with the patient or authorized representative who has indicated his/her understanding and acceptance.     Dental Advisory Given  Plan Discussed with: CRNA  Anesthesia Plan Comments:         Anesthesia Quick Evaluation

## 2020-04-07 NOTE — Op Note (Signed)
[facility] Patient Name: Lindsey Buckley Procedure Date: 04/07/2020 9:17 AM MRN: [id number] Date of Birth: 06/02/1950 Attending MD: Elon Alas. Abbey Chatters DO CSN: [id number] Age: 70 Admit Type: Outpatient Procedure:                Colonoscopy Indications:              Rectal bleeding Providers:                Elon Alas. Abbey Chatters, DO, Lambert Mody, Aram Candela Referring MD:              Medicines:                See the Anesthesia note for documentation of the                            administered medications Complications:            No immediate complications. Estimated Blood Loss:     Estimated blood loss was minimal. Procedure:                Pre-Anesthesia Assessment:                           - The anesthesia plan was to use monitored                            anesthesia care (MAC).                           After obtaining informed consent, the colonoscope                            was passed under direct vision. Throughout the                            procedure, the patient's blood pressure, pulse, and                            oxygen saturations were monitored continuously. The                            PCF-[id number] ([id number]) was introduced through the                            anus and advanced to the the cecum, identified by                            appendiceal orifice and ileocecal valve. The                            colonoscopy was performed without difficulty. The                            patient tolerated the procedure well. The quality  of the bowel preparation was evaluated using the                            BBPS [facility] Bowel Preparation Scale) with scores                            of: Right Colon = 2 (minor amount of residual                            staining, small fragments of stool and/or opaque                            liquid, but mucosa seen well), Transverse Colon = 3                             (entire mucosa seen well with no residual staining,                            small fragments of stool or opaque liquid) and Left                            Colon = 3 (entire mucosa seen well with no residual                            staining, small fragments of stool or opaque                            liquid). The total BBPS score equals 8. The quality                            of the bowel preparation was good. Scope In: 9:39:20 AM Scope Out: 9:53:46 AM Scope Withdrawal Time: 0 hours 10 minutes 13 seconds  Total Procedure Duration: 0 hours 14 minutes 26 seconds  Findings:      The perianal and digital rectal examinations were normal.      Non-bleeding internal hemorrhoids were found during retroflexion.      Multiple small-mouthed diverticula were found in the entire colon.      A 6 mm polyp was found in the ascending colon. The polyp was sessile.       The polyp was removed with a cold snare. Resection and retrieval were       complete.      A 2 mm polyp was found in the transverse colon. The polyp was sessile.       The polyp was removed with a jumbo cold forceps. Resection and retrieval       were complete.      A 3 mm polyp was found in the sigmoid colon. The polyp was sessile. The       polyp was removed with a cold snare. Resection and retrieval were       complete.      A 8 mm polyp was found in the recto-sigmoid colon. The polyp was       pedunculated. The polyp was removed with a cold snare. Resection and  retrieval were complete. Impression:               - Non-bleeding internal hemorrhoids.                           - Diverticulosis in the entire examined colon.                           - One 6 mm polyp in the ascending colon, removed                            with a cold snare. Resected and retrieved.                           - One 2 mm polyp in the transverse colon, removed                            with a jumbo cold forceps. Resected and  retrieved.                           - One 3 mm polyp in the sigmoid colon, removed with                            a cold snare. Resected and retrieved.                           - One 8 mm polyp at the recto-sigmoid colon,                            removed with a cold snare. Resected and retrieved. Moderate Sedation:      Per Anesthesia Care Recommendation:           - Patient has a contact number available for                            emergencies. The signs and symptoms of potential                            delayed complications were discussed with the                            patient. Return to normal activities tomorrow.                            Written discharge instructions were provided to the                            patient.                           - Resume previous diet.                           - Continue present medications.                           -  Await pathology results.                           - Repeat colonoscopy in 5 years for surveillance.                           - Return to GI clinic in 2 months with Roseanne Kaufman                            for hemorrhoid banding. Procedure Code(s):        --- Professional ---                           [phone], Colonoscopy, flexible; with removal of                            tumor(s), polyp(s), or other lesion(s) by snare                            technique                           45380, 46, Colonoscopy, flexible; with biopsy,                            single or multiple Diagnosis Code(s):        --- Professional ---                           K64.8, Other hemorrhoids                           K63.5, Polyp of colon                           K62.5, Hemorrhage of anus and rectum                           K57.30, Diverticulosis of large intestine without                            perforation or abscess without bleeding CPT copyright 2019 American Medical Association. All rights reserved. The codes documented in  this report are preliminary and upon coder review may  be revised to meet current compliance requirements. Elon Alas. Abbey Chatters, DO [place] Abbey Chatters, DO 04/07/2020 9:57:53 AM This report has been signed electronically. Number of Addenda: 0

## 2020-04-07 NOTE — Transfer of Care (Signed)
Immediate Anesthesia Transfer of Care Note  Patient: Lindsey Buckley  Procedure(s) Performed: COLONOSCOPY WITH PROPOFOL (N/A ) POLYPECTOMY  Patient Location: PACU  Anesthesia Type:General  Level of Consciousness: awake, alert , oriented and patient cooperative  Airway & Oxygen Therapy: Patient Spontanous Breathing  Post-op Assessment: Report given to RN, Post -op Vital signs reviewed and stable and Patient moving all extremities X 4  Post vital signs: Reviewed and stable  Last Vitals:  Vitals Value Taken Time  BP    Temp    Pulse    Resp 15 04/07/20 0958  SpO2    Vitals shown include unvalidated device data.  Last Pain:  Vitals:   04/07/20 0936  TempSrc:   PainSc: 0-No pain      Patients Stated Pain Goal: 9 (40/10/27 2536)  Complications: No complications documented.

## 2020-04-07 NOTE — Interval H&P Note (Signed)
History and Physical Interval Note:  04/07/2020 8:55 AM  Lindsey Buckley  has presented today for surgery, with the diagnosis of rectal bleeding.  The various methods of treatment have been discussed with the patient and family. After consideration of risks, benefits and other options for treatment, the patient has consented to  Procedure(s) with comments: COLONOSCOPY WITH PROPOFOL (N/A) - 2:00pm, pt knows to arrive at 8:15 as a surgical intervention.  The patient's history has been reviewed, patient examined, no change in status, stable for surgery.  I have reviewed the patient's chart and labs.  Questions were answered to the patient's satisfaction.     [name]

## 2020-04-07 NOTE — Anesthesia Postprocedure Evaluation (Signed)
Anesthesia Post Note  Patient: Lindsey Buckley  Procedure(s) Performed: COLONOSCOPY WITH PROPOFOL (N/A ) POLYPECTOMY  Patient location during evaluation: PACU Anesthesia Type: General Level of consciousness: oriented, awake, awake and alert and patient cooperative Pain management: satisfactory to patient Vital Signs Assessment: post-procedure vital signs reviewed and stable Respiratory status: spontaneous breathing, respiratory function stable and nonlabored ventilation Cardiovascular status: stable Postop Assessment: no apparent nausea or vomiting Anesthetic complications: no   No complications documented.   Last Vitals:  Vitals:   04/07/20 0958 04/07/20 1000  BP: 117/64   Resp: 15   Temp: 36.9 C   SpO2:  93%    Last Pain:  Vitals:   04/07/20 0936  TempSrc:   PainSc: 0-No pain                 Cleophas Yoak

## 2020-04-07 NOTE — Discharge Instructions (Signed)
  Colonoscopy Discharge Instructions  Read the instructions outlined below and refer to this sheet in the next few weeks. These discharge instructions provide you with general information on caring for yourself after you leave the hospital. Your doctor may also give you specific instructions. While your treatment has been planned according to the most current medical practices available, unavoidable complications occasionally occur.   ACTIVITY  You may resume your regular activity, but move at a slower pace for the next 24 hours.   Take frequent rest periods for the next 24 hours.   Walking will help get rid of the air and reduce the bloated feeling in your belly (abdomen).   No driving for 24 hours (because of the medicine (anesthesia) used during the test).    Do not sign any important legal documents or operate any machinery for 24 hours (because of the anesthesia used during the test).  NUTRITION  Drink plenty of fluids.   You may resume your normal diet as instructed by your doctor.   Begin with a light meal and progress to your normal diet. Heavy or fried foods are harder to digest and may make you feel sick to your stomach (nauseated).   Avoid alcoholic beverages for 24 hours or as instructed.  MEDICATIONS  You may resume your normal medications unless your doctor tells you otherwise.  WHAT YOU CAN EXPECT TODAY  Some feelings of bloating in the abdomen.   Passage of more gas than usual.   Spotting of blood in your stool or on the toilet paper.  IF YOU HAD POLYPS REMOVED DURING THE COLONOSCOPY:  No aspirin products for 7 days or as instructed.   No alcohol for 7 days or as instructed.   Eat a soft diet for the next 24 hours.  FINDING OUT THE RESULTS OF YOUR TEST Not all test results are available during your visit. If your test results are not back during the visit, make an appointment with your caregiver to find out the results. Do not assume everything is normal if  you have not heard from your caregiver or the medical facility. It is important for you to follow up on all of your test results.  SEEK IMMEDIATE MEDICAL ATTENTION IF:  You have more than a spotting of blood in your stool.   Your belly is swollen (abdominal distention).   You are nauseated or vomiting.   You have a temperature over 101.   You have abdominal pain or discomfort that is severe or gets worse throughout the day.   Your colonoscopy revealed 4 polyp(s) which I removed successfully. Await pathology results, my office will contact you. I recommend repeating colonoscopy in 5 years for surveillance purposes.   You also have diverticulosis and internal hemorrhoids. I would recommend increasing fiber in your diet or adding OTC Benefiber/Metamucil. Be sure to drink at least 4 to 6 glasses of water daily.   Follow-up with Roseanne Kaufman in 2-3 months to discuss hemorrhoid banding if still having bleeding.     I hope you have a great rest of your week!  Elon Alas. Abbey Chatters, D.O. Gastroenterology and Hepatology [facility]

## 2020-04-08 LAB — SURGICAL PATHOLOGY

## 2020-04-10 ENCOUNTER — Encounter (HOSPITAL_COMMUNITY): Payer: Self-pay | Admitting: Internal Medicine

## 2020-04-11 ENCOUNTER — Other Ambulatory Visit: Payer: Self-pay | Admitting: Cardiology

## 2020-04-16 ENCOUNTER — Other Ambulatory Visit: Payer: Self-pay | Admitting: Cardiology

## 2020-04-20 NOTE — Patient Instructions (Addendum)
DUE TO COVID-19 ONLY ONE VISITOR IS ALLOWED TO COME WITH YOU AND STAY IN THE WAITING ROOM ONLY DURING PRE OP AND PROCEDURE DAY OF SURGERY. THE 1 VISITOR  MAY VISIT WITH YOU AFTER SURGERY IN YOUR PRIVATE ROOM DURING VISITING HOURS ONLY!  YOU NEED TO HAVE A COVID 19 TEST ON_1/24______ @__1 :15 p_____, THIS TEST MUST BE DONE BEFORE SURGERY,  COVID TESTING SITE [place] [address], IT IS ON THE RIGHT GOING OUT [address] APPROXIMATELY  2 MINUTES PAST [employer] ON THE RIGHT. ONCE YOUR COVID TEST IS COMPLETED,  PLEASE BEGIN THE QUARANTINE INSTRUCTIONS AS OUTLINED IN YOUR HANDOUT.                Lindsey Buckley    Your procedure is scheduled on: 04/30/20   Report to [facility] Main  Entrance   Report to short stay at 5:30 AM     Call this number if you have problems the morning of surgery [phone]    Remember: Do not eat food or drink liquids :After Midnight.   BRUSH YOUR TEETH MORNING OF SURGERY AND RINSE YOUR MOUTH OUT, NO CHEWING GUM CANDY OR MINTS.     Take these medicines the morning of surgery with A SIP OF WATER: Metoprolol, Nifedipine. Use your inhalers and bring them with you  DO NOT TAKE ANY DIABETIC MEDICATIONS DAY OF YOUR SURGERY    How to Manage Your Diabetes Before and After Surgery  Why is it important to control my blood sugar before and after surgery? . Improving blood sugar levels before and after surgery helps healing and can limit problems. . A way of improving blood sugar control is eating a healthy diet by: o  Eating less sugar and carbohydrates o  Increasing activity/exercise o  Talking with your doctor about reaching your blood sugar goals . High blood sugars (greater than 180 mg/dL) can raise your risk of infections and slow your recovery, so you will need to focus on controlling your diabetes during the weeks before surgery. . Make sure that the doctor who takes care of your diabetes knows about your planned  surgery including the date and location.  How do I manage my blood sugar before surgery? . Check your blood sugar at least 4 times a day, starting 2 days before surgery, to make sure that the level is not too high or low. o Check your blood sugar the morning of your surgery when you wake up and every 2 hours until you get to the Short Stay unit. . If your blood sugar is less than 70 mg/dL, you will need to treat for low blood sugar: o Do not take insulin. o Treat a low blood sugar (less than 70 mg/dL) with  cup of clear juice (cranberry or apple), 4 glucose tablets, OR glucose gel. o Recheck blood sugar in 15 minutes after treatment (to make sure it is greater than 70 mg/dL). If your blood sugar is not greater than 70 mg/dL on recheck, call [phone] for further instructions. . Report your blood sugar to the short stay nurse when you get to Short Stay.  . If you are admitted to the hospital after surgery: o Your blood sugar will be checked by the staff and you will probably be given insulin after surgery (instead of oral diabetes medicines) to make sure you have good blood sugar levels. o The goal for blood sugar control after surgery is 80-180 mg/dL.   WHAT  DO I DO ABOUT MY DIABETES MEDICATION?  Marland Kitchen Do not take oral diabetes medicines (pills) the morning of surgery. .                          You may not have any metal on your body including hair pins and              piercings  Do not wear jewelry, make-up, lotions, powders or perfumes, deodorant             Do not wear nail polish on your fingernails.  Do not shave  48 hours prior to surgery.               Do not bring valuables to the hospital. [place].  Contacts, dentures or bridgework may not be worn into surgery.                [facility] - Preparing for Surgery Before surgery, you can play an important role.   Because skin is not sterile, your skin needs to be as free  of germs as possible.   You can reduce the number of germs on your skin by washing with CHG (chlorahexidine gluconate) soap before surgery.   CHG is an antiseptic cleaner which kills germs and bonds with the skin to continue killing germs even after washing. Please DO NOT use if you have an allergy to CHG or antibacterial soaps.   If your skin becomes reddened/irritated stop using the CHG and inform your nurse when you arrive at Short Stay. Do not shave (including legs and underarms) for at least 48 hours prior to the first CHG shower.  Please follow these instructions carefully:  1.  Shower with CHG Soap the night before surgery and the  morning of Surgery.  2.  If you choose to wash your hair, wash your hair first as usual with your  normal  shampoo.  3.  After you shampoo, rinse your hair and body thoroughly to remove the  shampoo.                                        4.  Use CHG as you would any other liquid soap.  You can apply chg directly  to the skin and wash                       Gently with a scrungie or clean washcloth.  5.  Apply the CHG Soap to your body ONLY FROM THE NECK DOWN.   Do not use on face/ open                           Wound or open sores. Avoid contact with eyes, ears mouth and genitals (private parts).                       Wash face,  Genitals (private parts) with your normal soap.             6.  Wash thoroughly, paying special attention to the area where your surgery  will be performed.  7.  Thoroughly rinse your body with warm water from the  neck down.  8.  DO NOT shower/wash with your normal soap after using and rinsing off  the CHG Soap.             9.  Pat yourself dry with a clean towel.            10.  Wear clean pajamas.            11.  Place clean sheets on your bed the night of your first shower and do not  sleep with pets. Day of Surgery : Do not apply any lotions/deodorants the morning of surgery.  Please wear clean clothes to the hospital/surgery  center.  FAILURE TO FOLLOW THESE INSTRUCTIONS MAY RESULT IN THE CANCELLATION OF YOUR SURGERY PATIENT SIGNATURE_________________________________  NURSE SIGNATURE__________________________________  ________________________________________________________________________

## 2020-04-21 ENCOUNTER — Encounter (HOSPITAL_COMMUNITY): Payer: Self-pay

## 2020-04-21 ENCOUNTER — Other Ambulatory Visit: Payer: Self-pay

## 2020-04-21 ENCOUNTER — Encounter (HOSPITAL_COMMUNITY)
Admission: RE | Admit: 2020-04-21 | Discharge: 2020-04-21 | Disposition: A | Discharge status: Home / Self Care | Payer: Medicare Other | Source: Ambulatory Visit | Attending: Urology | Admitting: Urology

## 2020-04-21 DIAGNOSIS — Z01812 Encounter for preprocedural laboratory examination: Secondary | ICD-10-CM | POA: Diagnosis not present

## 2020-04-21 LAB — CBC
HCT: 44.4 % (ref 36.0–46.0)
Hemoglobin: 14.7 g/dL (ref 12.0–15.0)
MCH: 30.6 pg (ref 26.0–34.0)
MCHC: 33.1 g/dL (ref 30.0–36.0)
MCV: 92.3 fL (ref 80.0–100.0)
Platelets: 320 10*3/uL (ref 150–400)
RBC: 4.81 MIL/uL (ref 3.87–5.11)
RDW: 12.8 % (ref 11.5–15.5)
WBC: 10.9 10*3/uL — ABNORMAL HIGH (ref 4.0–10.5)
nRBC: 0 % (ref 0.0–0.2)

## 2020-04-21 LAB — BASIC METABOLIC PANEL
Anion gap: 8 (ref 5–15)
BUN: 16 mg/dL (ref 8–23)
CO2: 27 mmol/L (ref 22–32)
Calcium: 9.8 mg/dL (ref 8.9–10.3)
Chloride: 105 mmol/L (ref 98–111)
Creatinine, Ser: 1.11 mg/dL — ABNORMAL HIGH (ref 0.44–1.00)
GFR, Estimated: 54 mL/min — ABNORMAL LOW (ref >60)
Glucose, Bld: 107 mg/dL — ABNORMAL HIGH (ref 70–99)
Potassium: 3.7 mmol/L (ref 3.5–5.1)
Sodium: 140 mmol/L (ref 135–145)

## 2020-04-21 LAB — HEMOGLOBIN A1C
Hgb A1c MFr Bld: 5.7 % — ABNORMAL HIGH (ref 4.8–5.6)
Mean Plasma Glucose: 116.89 mg/dL

## 2020-04-21 LAB — GLUCOSE, CAPILLARY: Glucose-Capillary: 115 mg/dL — ABNORMAL HIGH (ref 70–99)

## 2020-04-21 NOTE — Progress Notes (Signed)
COVID Vaccine Completed:yes Date COVID Vaccine completed:09/25/19 COVID vaccine manufacturer:   Moderna    PCP - Dr. Jaclynn Guarneri Cardiologist - Dr. Zandra Abts  Chest x-ray - no EKG - 04/02/20-epic Stress Test - no ECHO - no Cardiac Cath - no Pacemaker/ICD device last checked:NA  Sleep Study - no CPAP -   Fasting Blood Sugar - Pt doesn't test Checks Blood Sugar _____ times a day  Blood Thinner Instructions:NA Aspirin Instructions: Last Dose:  Anesthesia review:   Patient denies shortness of breath, fever, cough and chest pain at PAT appointment yes  Patient verbalized understanding of instructions that were given to them at the PAT appointment. Patient was also instructed that they will need to review over the PAT instructions again at home before surgery. Yes Pt doesn't climb stairs often but has no SOB doing housework or ADLs. She had cardiac work up while living in [place]. She hasn't had any problems since being on Metoprolol.

## 2020-04-27 ENCOUNTER — Other Ambulatory Visit (HOSPITAL_COMMUNITY)
Admission: RE | Admit: 2020-04-27 | Discharge: 2020-04-27 | Disposition: A | Discharge status: Home / Self Care | Payer: Medicare Other | Source: Ambulatory Visit | Attending: Urology | Admitting: Urology

## 2020-04-27 DIAGNOSIS — U071 COVID-19: Secondary | ICD-10-CM | POA: Insufficient documentation

## 2020-04-27 DIAGNOSIS — Z01812 Encounter for preprocedural laboratory examination: Secondary | ICD-10-CM | POA: Insufficient documentation

## 2020-04-27 LAB — SARS CORONAVIRUS 2 (TAT 6-24 HRS): SARS Coronavirus 2: POSITIVE — AB

## 2020-04-28 ENCOUNTER — Telehealth: Payer: Self-pay | Admitting: Urology

## 2020-04-28 NOTE — Telephone Encounter (Signed)
FYI- I will have to request new date at [facility]

## 2020-04-28 NOTE — Progress Notes (Signed)
Patient with positive covid result. Contacted MD and informed of result.   

## 2020-04-28 NOTE — Telephone Encounter (Signed)
Lindsey Buckley with preop called and stated this patient was positive for covid and her procedure needs to be cancelled. Her number is [phone].

## 2020-04-29 ENCOUNTER — Telehealth: Payer: Self-pay

## 2020-04-29 NOTE — Telephone Encounter (Signed)
Received message patient was positive for covid. Procedure for 04/30/2020 has been post poned to March 24th, 2022.    Pt notified and voiced understanding. Dr. Alyson Ingles also agreed to new surgery date request.

## 2020-04-30 DIAGNOSIS — N2889 Other specified disorders of kidney and ureter: Secondary | ICD-10-CM

## 2020-05-07 ENCOUNTER — Ambulatory Visit: Payer: Medicare Other | Admitting: Urology

## 2020-05-08 ENCOUNTER — Ambulatory Visit: Payer: Medicare Other | Admitting: Urology

## 2020-06-09 ENCOUNTER — Other Ambulatory Visit: Payer: Self-pay

## 2020-06-09 ENCOUNTER — Ambulatory Visit (INDEPENDENT_AMBULATORY_CARE_PROVIDER_SITE_OTHER): Payer: Medicare Other | Admitting: Gastroenterology

## 2020-06-09 ENCOUNTER — Encounter: Payer: Self-pay | Admitting: Gastroenterology

## 2020-06-09 DIAGNOSIS — Z860100 Personal history of colon polyps, unspecified: Secondary | ICD-10-CM | POA: Insufficient documentation
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
## 2020-06-09 NOTE — Patient Instructions (Addendum)
We will see you as needed!  Your next colonoscopy will be in 5 years!  Please call with any black, tarry stool or bright red blood in your stool.   I enjoyed seeing you again today! As you know, I value our relationship and want to provide genuine, compassionate, and quality care. I welcome your feedback. If you receive a survey regarding your visit,  I greatly appreciate you taking time to fill this out. See you next time!  Annitta Needs, PhD, ANP-BC [facility] Gastroenterology

## 2020-06-10 NOTE — Progress Notes (Signed)
DUE TO COVID-19 ONLY ONE VISITOR IS ALLOWED TO COME WITH YOU AND STAY IN THE WAITING ROOM ONLY DURING PRE OP AND PROCEDURE DAY OF SURGERY. THE 1 VISITOR  MAY VISIT WITH YOU AFTER SURGERY IN YOUR PRIVATE ROOM DURING VISITING HOURS ONLY!  YOU NEED TO HAVE A COVID 19 TEST ON_______ @_______ , THIS TEST MUST BE DONE BEFORE SURGERY,  COVID TESTING SITE [address] [place] [address], IT IS ON THE RIGHT GOING OUT [address] APPROXIMATELY  2 MINUTES PAST [employer] ON THE RIGHT. ONCE YOUR COVID TEST IS COMPLETED,  PLEASE BEGIN THE QUARANTINE INSTRUCTIONS AS OUTLINED IN YOUR HANDOUT.                Lindsey Buckley  06/10/2020   Your procedure is scheduled on: 06/25/2020    Report to [facility] Main  Entrance   Report to admitting at   [place] AM     Call this number if you have problems the morning of surgery [phone]    Remember: Do not eat food , candy gum or mints :After Midnight. You may have clear liquids from midnight until 0430 am     CLEAR LIQUID DIET   Foods Allowed                                                                       Coffee and tea, regular and decaf                              Plain Jell-O any favor except red or purple                                            Fruit ices (not with fruit pulp)                                      Iced Popsicles                                     Carbonated beverages, regular and diet                                    Cranberry, grape and apple juices Sports drinks like Gatorade Lightly seasoned clear broth or consume(fat free) Sugar, honey syrup   _____________________________________________________________________    BRUSH YOUR TEETH MORNING OF SURGERY AND RINSE YOUR MOUTH OUT, NO CHEWING GUM CANDY OR MINTS.     Take these medicines the morning of surgery with A SIP OF WATER: Inhalers as usual and bring, nebulizer if needed, Toprol. Procardia   DO NOT TAKE ANY DIABETIC  MEDICATIONS DAY OF YOUR SURGERY  You may not have any metal on your body including hair pins and              piercings  Do not wear jewelry, make-up, lotions, powders or perfumes, deodorant             Do not wear nail polish on your fingernails.  Do not shave  48 hours prior to surgery.              Men may shave face and neck.   Do not bring valuables to the hospital. [place].  Contacts, dentures or bridgework may not be worn into surgery.  Leave suitcase in the car. After surgery it may be brought to your room.     Patients discharged the day of surgery will not be allowed to drive home. IF YOU ARE HAVING SURGERY AND GOING HOME THE SAME DAY, YOU MUST HAVE AN ADULT TO DRIVE YOU HOME AND BE WITH YOU FOR 24 HOURS. YOU MAY GO HOME BY TAXI OR UBER OR ORTHERWISE, BUT AN ADULT MUST ACCOMPANY YOU HOME AND STAY WITH YOU FOR 24 HOURS.  Name and phone number of your driver:  Special Instructions: N/A              Please read over the following fact sheets you were given: _____________________________________________________________________  [facility] - Preparing for Surgery Before surgery, you can play an important role.  Because skin is not sterile, your skin needs to be as free of germs as possible.  You can reduce the number of germs on your skin by washing with CHG (chlorahexidine gluconate) soap before surgery.  CHG is an antiseptic cleaner which kills germs and bonds with the skin to continue killing germs even after washing. Please DO NOT use if you have an allergy to CHG or antibacterial soaps.  If your skin becomes reddened/irritated stop using the CHG and inform your nurse when you arrive at Short Stay. Do not shave (including legs and underarms) for at least 48 hours prior to the first CHG shower.  You may shave your face/neck. Please follow these instructions carefully:  1.  Shower with CHG Soap the night  before surgery and the  morning of Surgery.  2.  If you choose to wash your hair, wash your hair first as usual with your  normal  shampoo.  3.  After you shampoo, rinse your hair and body thoroughly to remove the  shampoo.                           4.  Use CHG as you would any other liquid soap.  You can apply chg directly  to the skin and wash                       Gently with a scrungie or clean washcloth.  5.  Apply the CHG Soap to your body ONLY FROM THE NECK DOWN.   Do not use on face/ open                           Wound or open sores. Avoid contact with eyes, ears mouth and genitals (private parts).  Wash face,  Genitals (private parts) with your normal soap.             6.  Wash thoroughly, paying special attention to the area where your surgery  will be performed.  7.  Thoroughly rinse your body with warm water from the neck down.  8.  DO NOT shower/wash with your normal soap after using and rinsing off  the CHG Soap.                9.  Pat yourself dry with a clean towel.            10.  Wear clean pajamas.            11.  Place clean sheets on your bed the night of your first shower and do not  sleep with pets. Day of Surgery : Do not apply any lotions/deodorants the morning of surgery.  Please wear clean clothes to the hospital/surgery center.  FAILURE TO FOLLOW THESE INSTRUCTIONS MAY RESULT IN THE CANCELLATION OF YOUR SURGERY PATIENT SIGNATURE_________________________________  NURSE SIGNATURE__________________________________  ________________________________________________________________________

## 2020-06-10 NOTE — Progress Notes (Signed)
Cc'ed to pcp °

## 2020-06-15 ENCOUNTER — Encounter (HOSPITAL_COMMUNITY): Payer: Self-pay

## 2020-06-15 ENCOUNTER — Encounter (HOSPITAL_COMMUNITY)
Admission: RE | Admit: 2020-06-15 | Discharge: 2020-06-15 | Disposition: A | Discharge status: Home / Self Care | Payer: Medicare Other | Source: Ambulatory Visit | Attending: Urology | Admitting: Urology

## 2020-06-15 ENCOUNTER — Other Ambulatory Visit: Payer: Self-pay

## 2020-06-15 DIAGNOSIS — Z01812 Encounter for preprocedural laboratory examination: Secondary | ICD-10-CM | POA: Diagnosis present

## 2020-06-15 LAB — BASIC METABOLIC PANEL WITH GFR
Anion gap: 12 (ref 5–15)
BUN: 13 mg/dL (ref 8–23)
CO2: 24 mmol/L (ref 22–32)
Calcium: 9.9 mg/dL (ref 8.9–10.3)
Chloride: 104 mmol/L (ref 98–111)
Creatinine, Ser: 1.06 mg/dL — ABNORMAL HIGH (ref 0.44–1.00)
GFR, Estimated: 57 mL/min — ABNORMAL LOW (ref >60)
Glucose, Bld: 100 mg/dL — ABNORMAL HIGH (ref 70–99)
Potassium: 3.9 mmol/L (ref 3.5–5.1)
Sodium: 140 mmol/L (ref 135–145)

## 2020-06-15 LAB — CBC
HCT: 42 % (ref 36.0–46.0)
Hemoglobin: 13.8 g/dL (ref 12.0–15.0)
MCH: 30 pg (ref 26.0–34.0)
MCHC: 32.9 g/dL (ref 30.0–36.0)
MCV: 91.3 fL (ref 80.0–100.0)
Platelets: 284 10*3/uL (ref 150–400)
RBC: 4.6 MIL/uL (ref 3.87–5.11)
RDW: 12.8 % (ref 11.5–15.5)
WBC: 6.9 10*3/uL (ref 4.0–10.5)
nRBC: 0 % (ref 0.0–0.2)

## 2020-06-15 NOTE — Progress Notes (Addendum)
Anesthesia Review:  PCP: DR Cindie Laroche  Requested clearance and office visit note on 06/15/20 they are to send.  OV of 04/24/2020 on chart along with labs.   Cardiologist : Chest x-ray : EKG :12/30/021 ,04/22/20- EKG on chart   Echo : Stress test: Cardiac Cath :  Activity level: can do a flight of stairs without difficulty  Sleep Study/ CPAP : no  Fasting Blood Sugar :      / Checks Blood Sugar -- times a day:   Blood Thinner/ Instructions /Last Dose: ASA / Instructions/ Last Dose :  covid positive on 04/27/20- results in epic.   DM- borderline per pt  hgba1c-04/21/2020 5.7

## 2020-06-24 NOTE — Anesthesia Preprocedure Evaluation (Addendum)
Anesthesia Evaluation  Patient identified by MRN, date of birth, ID band Patient awake    Reviewed: Allergy & Precautions, NPO status , Patient's Chart, lab work & pertinent test results  Airway Mallampati: III       Dental no notable dental hx.    Pulmonary asthma , former smoker,    Pulmonary exam normal        Cardiovascular hypertension, Pt. on medications and Pt. on home beta blockers Normal cardiovascular exam     Neuro/Psych negative neurological ROS  negative psych ROS   GI/Hepatic Neg liver ROS,   Endo/Other  diabetes  Renal/GU   negative genitourinary   Musculoskeletal negative musculoskeletal ROS (+)   Abdominal (+) + obese,   Peds  Hematology negative hematology ROS (+)   Anesthesia Other Findings   Reproductive/Obstetrics                            Anesthesia Physical Anesthesia Plan  ASA: II  Anesthesia Plan: General   Post-op Pain Management:    Induction: Intravenous  PONV Risk Score and Plan: 4 or greater and Ondansetron, Dexamethasone and Midazolam  Airway Management Planned: Oral ETT  Additional Equipment: None  Intra-op Plan:   Post-operative Plan: Extubation in OR  Informed Consent: I have reviewed the patients History and Physical, chart, labs and discussed the procedure including the risks, benefits and alternatives for the proposed anesthesia with the patient or authorized representative who has indicated his/her understanding and acceptance.     Dental advisory given  Plan Discussed with: CRNA  Anesthesia Plan Comments:        Anesthesia Quick Evaluation

## 2020-06-25 ENCOUNTER — Encounter (HOSPITAL_COMMUNITY): Payer: Self-pay | Admitting: Urology

## 2020-06-25 ENCOUNTER — Inpatient Hospital Stay (HOSPITAL_COMMUNITY): Payer: Medicare Other | Admitting: Physician Assistant

## 2020-06-25 ENCOUNTER — Inpatient Hospital Stay (HOSPITAL_COMMUNITY): Payer: Medicare Other

## 2020-06-25 ENCOUNTER — Inpatient Hospital Stay (HOSPITAL_COMMUNITY)
Admission: RE | Admit: 2020-06-25 | Discharge: 2020-06-28 | DRG: 658 | Disposition: A | Discharge status: Home / Self Care | Payer: Medicare Other | Source: Other Acute Inpatient Hospital | Attending: Urology | Admitting: Urology

## 2020-06-25 ENCOUNTER — Encounter (HOSPITAL_COMMUNITY)
Admission: RE | Disposition: A | Discharge status: Home / Self Care | Payer: Self-pay | Source: Other Acute Inpatient Hospital | Attending: Urology

## 2020-06-25 ENCOUNTER — Other Ambulatory Visit (HOSPITAL_COMMUNITY): Payer: Self-pay | Admitting: Physician Assistant

## 2020-06-25 ENCOUNTER — Other Ambulatory Visit: Payer: Self-pay

## 2020-06-25 DIAGNOSIS — C641 Malignant neoplasm of right kidney, except renal pelvis: Secondary | ICD-10-CM | POA: Diagnosis present

## 2020-06-25 DIAGNOSIS — Z888 Allergy status to other drugs, medicaments and biological substances status: Secondary | ICD-10-CM | POA: Diagnosis not present

## 2020-06-25 DIAGNOSIS — Z87891 Personal history of nicotine dependence: Secondary | ICD-10-CM

## 2020-06-25 DIAGNOSIS — Z8059 Family history of malignant neoplasm of other urinary tract organ: Secondary | ICD-10-CM

## 2020-06-25 DIAGNOSIS — N2889 Other specified disorders of kidney and ureter: Secondary | ICD-10-CM | POA: Diagnosis present

## 2020-06-25 DIAGNOSIS — Z800 Family history of malignant neoplasm of digestive organs: Secondary | ICD-10-CM

## 2020-06-25 DIAGNOSIS — N281 Cyst of kidney, acquired: Secondary | ICD-10-CM | POA: Diagnosis present

## 2020-06-25 DIAGNOSIS — K219 Gastro-esophageal reflux disease without esophagitis: Secondary | ICD-10-CM | POA: Diagnosis present

## 2020-06-25 DIAGNOSIS — Z8616 Personal history of COVID-19: Secondary | ICD-10-CM

## 2020-06-25 DIAGNOSIS — I10 Essential (primary) hypertension: Secondary | ICD-10-CM | POA: Diagnosis present

## 2020-06-25 DIAGNOSIS — E785 Hyperlipidemia, unspecified: Secondary | ICD-10-CM | POA: Diagnosis present

## 2020-06-25 DIAGNOSIS — D4101 Neoplasm of uncertain behavior of right kidney: Secondary | ICD-10-CM | POA: Diagnosis not present

## 2020-06-25 DIAGNOSIS — E119 Type 2 diabetes mellitus without complications: Secondary | ICD-10-CM | POA: Diagnosis present

## 2020-06-25 HISTORY — PX: ROBOTIC ASSITED PARTIAL NEPHRECTOMY: SHX6087

## 2020-06-25 LAB — TYPE AND SCREEN
ABO/RH(D): B POS
Antibody Screen: NEGATIVE

## 2020-06-25 LAB — HEMOGLOBIN AND HEMATOCRIT, BLOOD
HCT: 42.1 % (ref 36.0–46.0)
Hemoglobin: 13.6 g/dL (ref 12.0–15.0)

## 2020-06-25 LAB — GLUCOSE, CAPILLARY
Glucose-Capillary: 124 mg/dL — ABNORMAL HIGH (ref 70–99)
Glucose-Capillary: 131 mg/dL — ABNORMAL HIGH (ref 70–99)

## 2020-06-25 SURGERY — NEPHRECTOMY, PARTIAL, ROBOT-ASSISTED
Anesthesia: General | Laterality: Right

## 2020-06-25 MED ORDER — PHENYLEPHRINE HCL-NACL 10-0.9 MG/250ML-% IV SOLN: INTRAVENOUS | Status: DC | PRN

## 2020-06-25 MED ORDER — LACTATED RINGERS IV SOLN: INTRAVENOUS | Status: DC

## 2020-06-25 MED ORDER — SENNOSIDES-DOCUSATE SODIUM 8.6-50 MG PO TABS
2.0000 | ORAL_TABLET | Freq: Every day | ORAL | Status: DC
  Administered 2020-06-25 – 2020-06-27 (×3): 2 via ORAL
  Filled 2020-06-25 (×3): qty 2

## 2020-06-25 MED ORDER — DEXAMETHASONE SODIUM PHOSPHATE 10 MG/ML IJ SOLN
INTRAMUSCULAR | Status: AC
  Filled 2020-06-25: qty 1

## 2020-06-25 MED ORDER — ACETAMINOPHEN 10 MG/ML IV SOLN: 1000.0000 mg | Freq: Once | INTRAVENOUS | Status: DC | PRN

## 2020-06-25 MED ORDER — SODIUM CHLORIDE (PF) 0.9 % IJ SOLN
INTRAMUSCULAR | Status: AC
  Filled 2020-06-25: qty 10

## 2020-06-25 MED ORDER — OXYCODONE HCL 5 MG PO TABS
5.0000 mg | ORAL_TABLET | ORAL | Status: DC | PRN
  Administered 2020-06-25 – 2020-06-28 (×5): 5 mg via ORAL
  Filled 2020-06-25 (×5): qty 1

## 2020-06-25 MED ORDER — ACETAMINOPHEN 500 MG PO TABS
1000.0000 mg | ORAL_TABLET | Freq: Four times a day (QID) | ORAL | Status: AC
  Administered 2020-06-25 – 2020-06-26 (×4): 1000 mg via ORAL
  Filled 2020-06-25 (×4): qty 2

## 2020-06-25 MED ORDER — SODIUM CHLORIDE (PF) 0.9 % IJ SOLN
INTRAMUSCULAR | Status: AC
  Filled 2020-06-25: qty 20

## 2020-06-25 MED ORDER — ALBUTEROL SULFATE (2.5 MG/3ML) 0.083% IN NEBU
2.5000 mg | INHALATION_SOLUTION | Freq: Three times a day (TID) | RESPIRATORY_TRACT | Status: DC | PRN
  Administered 2020-06-26 – 2020-06-27 (×3): 2.5 mg via RESPIRATORY_TRACT
  Filled 2020-06-25 (×3): qty 3

## 2020-06-25 MED ORDER — ORAL CARE MOUTH RINSE: 15.0000 mL | Freq: Once | OROMUCOSAL | Status: AC

## 2020-06-25 MED ORDER — PROMETHAZINE HCL 25 MG/ML IJ SOLN: 6.2500 mg | INTRAMUSCULAR | Status: DC | PRN

## 2020-06-25 MED ORDER — SODIUM CHLORIDE 0.9 % IV SOLN
INTRAVENOUS | Status: DC | PRN
  Administered 2020-06-25: 50 ug/min via INTRAVENOUS

## 2020-06-25 MED ORDER — PROPOFOL 10 MG/ML IV BOLUS
INTRAVENOUS | Status: AC
  Filled 2020-06-25: qty 20

## 2020-06-25 MED ORDER — SUFENTANIL CITRATE 50 MCG/ML IV SOLN
INTRAVENOUS | Status: DC | PRN
  Administered 2020-06-25: 5 ug via INTRAVENOUS
  Administered 2020-06-25: 20 ug via INTRAVENOUS
  Administered 2020-06-25: 5 ug via INTRAVENOUS
  Administered 2020-06-25: 10 ug via INTRAVENOUS
  Administered 2020-06-25: 5 ug via INTRAVENOUS

## 2020-06-25 MED ORDER — ROCURONIUM BROMIDE 10 MG/ML (PF) SYRINGE
PREFILLED_SYRINGE | INTRAVENOUS | Status: AC
  Filled 2020-06-25: qty 10

## 2020-06-25 MED ORDER — DEXTROSE-NACL 5-0.45 % IV SOLN: INTRAVENOUS | Status: DC

## 2020-06-25 MED ORDER — MEPERIDINE HCL 50 MG/ML IJ SOLN: 6.2500 mg | INTRAMUSCULAR | Status: DC | PRN

## 2020-06-25 MED ORDER — ROCURONIUM BROMIDE 10 MG/ML (PF) SYRINGE
PREFILLED_SYRINGE | INTRAVENOUS | Status: DC | PRN
  Administered 2020-06-25: 80 mg via INTRAVENOUS
  Administered 2020-06-25: 20 mg via INTRAVENOUS

## 2020-06-25 MED ORDER — LIDOCAINE 2% (20 MG/ML) 5 ML SYRINGE
INTRAMUSCULAR | Status: AC
  Filled 2020-06-25: qty 5

## 2020-06-25 MED ORDER — ROSUVASTATIN CALCIUM 20 MG PO TABS
20.0000 mg | ORAL_TABLET | Freq: Every day | ORAL | Status: DC
  Administered 2020-06-25 – 2020-06-28 (×4): 20 mg via ORAL
  Filled 2020-06-25 (×5): qty 1

## 2020-06-25 MED ORDER — PROPOFOL 10 MG/ML IV BOLUS
INTRAVENOUS | Status: DC | PRN
Start: 2020-06-25 — End: 2020-06-25
  Administered 2020-06-25: 150 mg via INTRAVENOUS

## 2020-06-25 MED ORDER — NIFEDIPINE ER OSMOTIC RELEASE 60 MG PO TB24
90.0000 mg | ORAL_TABLET | Freq: Every day | ORAL | Status: DC
  Administered 2020-06-26 – 2020-06-28 (×3): 90 mg via ORAL
  Filled 2020-06-25 (×3): qty 1

## 2020-06-25 MED ORDER — PHENYLEPHRINE HCL (PRESSORS) 10 MG/ML IV SOLN
INTRAVENOUS | Status: AC
  Filled 2020-06-25: qty 1

## 2020-06-25 MED ORDER — HYDROMORPHONE HCL 1 MG/ML IJ SOLN
INTRAMUSCULAR | Status: AC
  Filled 2020-06-25: qty 1

## 2020-06-25 MED ORDER — SODIUM CHLORIDE (PF) 0.9 % IJ SOLN
INTRAMUSCULAR | Status: DC | PRN
  Administered 2020-06-25: 20 mL

## 2020-06-25 MED ORDER — CHLORHEXIDINE GLUCONATE 0.12 % MT SOLN
15.0000 mL | Freq: Once | OROMUCOSAL | Status: AC
  Administered 2020-06-25: 15 mL via OROMUCOSAL

## 2020-06-25 MED ORDER — DIPHENHYDRAMINE HCL 12.5 MG/5ML PO ELIX: 12.5000 mg | ORAL_SOLUTION | Freq: Four times a day (QID) | ORAL | Status: DC | PRN

## 2020-06-25 MED ORDER — CEFAZOLIN SODIUM-DEXTROSE 2-4 GM/100ML-% IV SOLN
2.0000 g | INTRAVENOUS | Status: AC
  Administered 2020-06-25: 2 g via INTRAVENOUS
  Filled 2020-06-25: qty 100

## 2020-06-25 MED ORDER — DIPHENHYDRAMINE HCL 50 MG/ML IJ SOLN: 12.5000 mg | Freq: Four times a day (QID) | INTRAMUSCULAR | Status: DC | PRN

## 2020-06-25 MED ORDER — SUFENTANIL CITRATE 50 MCG/ML IV SOLN
INTRAVENOUS | Status: AC
  Filled 2020-06-25: qty 1

## 2020-06-25 MED ORDER — CHLORHEXIDINE GLUCONATE CLOTH 2 % EX PADS
6.0000 | MEDICATED_PAD | Freq: Every day | CUTANEOUS | Status: DC
  Administered 2020-06-25 – 2020-06-26 (×2): 6 via TOPICAL

## 2020-06-25 MED ORDER — LIDOCAINE 2% (20 MG/ML) 5 ML SYRINGE
INTRAMUSCULAR | Status: DC | PRN
  Administered 2020-06-25: 80 mg via INTRAVENOUS

## 2020-06-25 MED ORDER — BUPIVACAINE LIPOSOME 1.3 % IJ SUSP
20.0000 mL | Freq: Once | INTRAMUSCULAR | Status: AC
  Administered 2020-06-25: 20 mL
  Filled 2020-06-25: qty 20

## 2020-06-25 MED ORDER — ONDANSETRON HCL 4 MG/2ML IJ SOLN
4.0000 mg | INTRAMUSCULAR | Status: DC | PRN
  Administered 2020-06-25: 4 mg via INTRAVENOUS
  Filled 2020-06-25 (×2): qty 2

## 2020-06-25 MED ORDER — METOPROLOL SUCCINATE ER 100 MG PO TB24
100.0000 mg | ORAL_TABLET | Freq: Two times a day (BID) | ORAL | Status: DC
  Administered 2020-06-25 – 2020-06-28 (×6): 100 mg via ORAL
  Filled 2020-06-25 (×6): qty 1

## 2020-06-25 MED ORDER — ORAL CARE MOUTH RINSE: 15.0000 mL | Freq: Once | OROMUCOSAL | Status: DC

## 2020-06-25 MED ORDER — ONDANSETRON HCL 4 MG/2ML IJ SOLN
INTRAMUSCULAR | Status: DC | PRN
  Administered 2020-06-25: 4 mg via INTRAVENOUS

## 2020-06-25 MED ORDER — MIDAZOLAM HCL 2 MG/2ML IJ SOLN
INTRAMUSCULAR | Status: AC
  Filled 2020-06-25: qty 2

## 2020-06-25 MED ORDER — CHLORHEXIDINE GLUCONATE 0.12 % MT SOLN: 15.0000 mL | Freq: Once | OROMUCOSAL | Status: DC

## 2020-06-25 MED ORDER — DAPAGLIFLOZIN PROPANEDIOL 10 MG PO TABS
10.0000 mg | ORAL_TABLET | Freq: Every day | ORAL | Status: DC
  Administered 2020-06-26 – 2020-06-28 (×3): 10 mg via ORAL
  Filled 2020-06-25 (×3): qty 1

## 2020-06-25 MED ORDER — STERILE WATER FOR IRRIGATION IR SOLN
Status: DC | PRN
  Administered 2020-06-25: 1000 mL

## 2020-06-25 MED ORDER — BELLADONNA ALKALOIDS-OPIUM 16.2-60 MG RE SUPP: 1.0000 | Freq: Four times a day (QID) | RECTAL | Status: DC | PRN

## 2020-06-25 MED ORDER — CHLORHEXIDINE GLUCONATE 4 % EX LIQD: 1.0000 "application " | Freq: Once | CUTANEOUS | Status: DC

## 2020-06-25 MED ORDER — POLYVINYL ALCOHOL 1.4 % OP SOLN: 1.0000 [drp] | Freq: Three times a day (TID) | OPHTHALMIC | Status: DC | PRN

## 2020-06-25 MED ORDER — LACTATED RINGERS IR SOLN
Status: DC | PRN
  Administered 2020-06-25: 1000 mL

## 2020-06-25 MED ORDER — ALBUTEROL SULFATE HFA 108 (90 BASE) MCG/ACT IN AERS
2.0000 | INHALATION_SPRAY | RESPIRATORY_TRACT | Status: DC | PRN
  Administered 2020-06-27: 2 via RESPIRATORY_TRACT
  Filled 2020-06-25: qty 6.7

## 2020-06-25 MED ORDER — DEXAMETHASONE SODIUM PHOSPHATE 10 MG/ML IJ SOLN
INTRAMUSCULAR | Status: DC | PRN
  Administered 2020-06-25: 6 mg via INTRAVENOUS

## 2020-06-25 MED ORDER — ACETAMINOPHEN 325 MG PO TABS: 325.0000 mg | ORAL_TABLET | ORAL | Status: DC | PRN

## 2020-06-25 MED ORDER — MIDAZOLAM HCL 2 MG/2ML IJ SOLN
INTRAMUSCULAR | Status: DC | PRN
  Administered 2020-06-25: 2 mg via INTRAVENOUS

## 2020-06-25 MED ORDER — ONDANSETRON HCL 4 MG/2ML IJ SOLN
INTRAMUSCULAR | Status: AC
  Filled 2020-06-25: qty 2

## 2020-06-25 MED ORDER — HYDROMORPHONE HCL 1 MG/ML IJ SOLN
0.5000 mg | INTRAMUSCULAR | Status: DC | PRN
  Administered 2020-06-25: 0.5 mg via INTRAVENOUS
  Administered 2020-06-25: 1 mg via INTRAVENOUS
  Filled 2020-06-25: qty 1

## 2020-06-25 MED ORDER — HYDROCODONE-ACETAMINOPHEN 5-325 MG PO TABS: 1.0000 | ORAL_TABLET | Freq: Four times a day (QID) | ORAL | 0 refills | Status: DC | PRN

## 2020-06-25 MED ORDER — HYDROMORPHONE HCL 1 MG/ML IJ SOLN: 0.2500 mg | INTRAMUSCULAR | Status: DC | PRN

## 2020-06-25 MED ORDER — ACETAMINOPHEN 160 MG/5ML PO SOLN: 325.0000 mg | ORAL | Status: DC | PRN

## 2020-06-25 MED ORDER — SUGAMMADEX SODIUM 200 MG/2ML IV SOLN
INTRAVENOUS | Status: DC | PRN
  Administered 2020-06-25: 200 mg via INTRAVENOUS

## 2020-06-25 MED FILL — HYDROCODON-APAP 5-325: 5-325 | 2 days supply | Qty: 20 | Fill #0

## 2020-06-25 SURGICAL SUPPLY — 66 items
APPLICATOR SURGIFLO ENDO (HEMOSTASIS) ×2 IMPLANT
CHLORAPREP W/TINT 26 (MISCELLANEOUS) ×2 IMPLANT
CLIP SUT LAPRA TY ABSORB (SUTURE) ×6 IMPLANT
CLIP VESOLOCK LG 6/CT PURPLE (CLIP) ×2 IMPLANT
CLIP VESOLOCK MED LG 6/CT (CLIP) ×6 IMPLANT
COVER SURGICAL LIGHT HANDLE (MISCELLANEOUS) ×2 IMPLANT
COVER TIP SHEARS 8 DVNC (MISCELLANEOUS) ×1 IMPLANT
COVER TIP SHEARS 8MM DA VINCI (MISCELLANEOUS) ×1
COVER WAND RF STERILE (DRAPES) IMPLANT
DECANTER SPIKE VIAL GLASS SM (MISCELLANEOUS) ×2 IMPLANT
DERMABOND ADVANCED (GAUZE/BANDAGES/DRESSINGS) ×1
DERMABOND ADVANCED .7 DNX12 (GAUZE/BANDAGES/DRESSINGS) ×1 IMPLANT
DRAIN CHANNEL 15F RND FF 3/16 (WOUND CARE) ×2 IMPLANT
DRAPE ARM DVNC X/XI (DISPOSABLE) ×4 IMPLANT
DRAPE COLUMN DVNC XI (DISPOSABLE) ×1 IMPLANT
DRAPE DA VINCI XI ARM (DISPOSABLE) ×4
DRAPE DA VINCI XI COLUMN (DISPOSABLE) ×1
DRAPE INCISE IOBAN 66X45 STRL (DRAPES) ×2 IMPLANT
DRAPE SHEET LG 3/4 BI-LAMINATE (DRAPES) ×2 IMPLANT
DRSG TEGADERM 4X4.75 (GAUZE/BANDAGES/DRESSINGS) ×2 IMPLANT
ELECT PENCIL ROCKER SW 15FT (MISCELLANEOUS) ×2 IMPLANT
ELECT REM PT RETURN 15FT ADLT (MISCELLANEOUS) ×2 IMPLANT
EVACUATOR SILICONE 100CC (DRAIN) ×2 IMPLANT
GLOVE BIOGEL M 8.0 STRL (GLOVE) ×4 IMPLANT
GLOVE SURG ENC MOIS LTX SZ6.5 (GLOVE) ×2 IMPLANT
GOWN STRL REUS W/TWL LRG LVL3 (GOWN DISPOSABLE) ×6 IMPLANT
HEMOSTAT SURGICEL 4X8 (HEMOSTASIS) ×2 IMPLANT
HOLDER FOLEY CATH W/STRAP (MISCELLANEOUS) ×2 IMPLANT
IRRIG SUCT STRYKERFLOW 2 WTIP (MISCELLANEOUS) ×2
IRRIGATION SUCT STRKRFLW 2 WTP (MISCELLANEOUS) ×1 IMPLANT
KIT BASIN OR (CUSTOM PROCEDURE TRAY) ×2 IMPLANT
KIT TURNOVER KIT A (KITS) ×2 IMPLANT
LOOP VESSEL MAXI BLUE (MISCELLANEOUS) ×2 IMPLANT
MARKER SKIN DUAL TIP RULER LAB (MISCELLANEOUS) ×2 IMPLANT
NEEDLE INSUFFLATION 14GA 120MM (NEEDLE) ×2 IMPLANT
NS IRRIG 1000ML POUR BTL (IV SOLUTION) ×2 IMPLANT
POUCH SPECIMEN RETRIEVAL 10MM (ENDOMECHANICALS) ×2 IMPLANT
PROTECTOR NERVE ULNAR (MISCELLANEOUS) ×4 IMPLANT
RELOAD STAPLER WHITE 60MM (STAPLE) IMPLANT
SEAL CANN UNIV 5-8 DVNC XI (MISCELLANEOUS) ×4 IMPLANT
SEAL XI 5MM-8MM UNIVERSAL (MISCELLANEOUS) ×4
SET TUBE SMOKE EVAC HIGH FLOW (TUBING) ×2 IMPLANT
SOLUTION ELECTROLUBE (MISCELLANEOUS) ×2 IMPLANT
SPONGE LAP 4X18 RFD (DISPOSABLE) ×2 IMPLANT
STAPLE ECHEON FLEX 60 POW ENDO (STAPLE) IMPLANT
STAPLER RELOAD WHITE 60MM (STAPLE)
SURGIFLO W/THROMBIN 8M KIT (HEMOSTASIS) IMPLANT
SUT ETHILON 3 0 PS 1 (SUTURE) ×2 IMPLANT
SUT MNCRL 3 0 VIOLET RB1 (SUTURE) ×1 IMPLANT
SUT MNCRL AB 4-0 PS2 18 (SUTURE) ×4 IMPLANT
SUT MONOCRYL 3 0 RB1 (SUTURE) ×1
SUT VIC AB 0 CT1 27 (SUTURE) ×4
SUT VIC AB 0 CT1 27XBRD ANTBC (SUTURE) ×4 IMPLANT
SUT VIC AB 0 CT1 36 (SUTURE) ×2 IMPLANT
SUT VIC AB 2-0 SH 27 (SUTURE) ×2
SUT VIC AB 2-0 SH 27X BRD (SUTURE) ×2 IMPLANT
SUT VLOC BARB 180 ABS3/0GR12 (SUTURE) ×2
SUTURE VLOC BRB 180 ABS3/0GR12 (SUTURE) ×1 IMPLANT
TOWEL OR 17X26 10 PK STRL BLUE (TOWEL DISPOSABLE) ×2 IMPLANT
TOWEL OR NON WOVEN STRL DISP B (DISPOSABLE) ×2 IMPLANT
TRAY FOLEY MTR SLVR 16FR STAT (SET/KITS/TRAYS/PACK) ×2 IMPLANT
TRAY LAPAROSCOPIC (CUSTOM PROCEDURE TRAY) ×2 IMPLANT
TROCAR BLADELESS OPT 5 100 (ENDOMECHANICALS) ×2 IMPLANT
TROCAR UNIVERSAL OPT 12M 100M (ENDOMECHANICALS) IMPLANT
TROCAR XCEL 12X100 BLDLESS (ENDOMECHANICALS) ×2 IMPLANT
WATER STERILE IRR 1000ML POUR (IV SOLUTION) ×2 IMPLANT

## 2020-06-25 NOTE — Anesthesia Postprocedure Evaluation (Signed)
Anesthesia Post Note  Patient: Lindsey Buckley  Procedure(s) Performed: XI ROBOTIC ASSITED PARTIAL NEPHRECTOMY (Right )     Patient location during evaluation: PACU Anesthesia Type: General Level of consciousness: sedated Pain management: pain level controlled Vital Signs Assessment: post-procedure vital signs reviewed and stable Respiratory status: spontaneous breathing Cardiovascular status: stable Postop Assessment: no apparent nausea or vomiting Anesthetic complications: no   No complications documented.  Last Vitals:  Vitals:   06/25/20 1045 06/25/20 1100  BP: (!) 157/97 (!) 165/87  Pulse: 60 60  Resp: (!) 7 10  Temp:  (!) 36.4 C  SpO2: 96% 96%    Last Pain:  Vitals:   06/25/20 1100  TempSrc:   PainSc: [place]

## 2020-06-25 NOTE — Discharge Instructions (Signed)

## 2020-06-25 NOTE — H&P (Signed)
Urology Admission H&P  Chief Complaint: right renal mass  History of Present Illness: Lindsey Buckley is a 70yo here for robotic right partial nephrectomy. She had a CT in 02/2020 which showed a right 4cm cystic renal mass. She previously tested positive for COVID in 05/2020 and surgery was rescheduled  Past Medical History:  Diagnosis Date  . 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 Chest pain    history of  . Diabetes mellitus without complication ([place]) 9371   borderline diabetes per pt per MD   . Dysrhythmia 2003  . Eczema   . GERD (gastroesophageal reflux disease)   . Hyperlipidemia   . Other and unspecified hyperlipidemia   . Pulmonary congestion and hypostasis 2003  . Pulmonary edema 2003  . Unspecified essential hypertension    Past Surgical History:  Procedure Laterality Date  . ABDOMINAL HYSTERECTOMY    . COLONOSCOPY     remote past in [place]  . COLONOSCOPY WITH PROPOFOL N/A 04/07/2020   non-bleeding internal hemorrhoids, multiple diverticula in entire colon, multiple polyps. Tubular adenomas. Surveillance in 5 years.   Marland Kitchen POLYPECTOMY  04/07/2020   Procedure: POLYPECTOMY;  Surgeon: [name], DO;  Location: [facility];  Service: Endoscopy;;  . TUBAL LIGATION      Home Medications:  Current Facility-Administered Medications  Medication Dose Route Frequency Provider Last Rate Last Admin  . ceFAZolin (ANCEF) IVPB 2g/100 mL premix  2 g Intravenous 30 min Pre-Op Gaylan Fauver, Candee Furbish, MD      . chlorhexidine (HIBICLENS) 4 % liquid 1 application  1 application Topical Once Mayo Faulk, Candee Furbish, MD      . chlorhexidine (PERIDEX) 0.12 % solution 15 mL  15 mL Mouth/Throat Once Lidia Collum, MD       Or  . MEDLINE mouth rinse  15 mL Mouth Rinse Once Lidia Collum, MD      . lactated ringers infusion   Intravenous Continuous Lidia Collum, MD 10 mL/hr at 06/25/20 0558 New Bag at 06/25/20 0558  . lactated ringers infusion   Intravenous Continuous Lynda Rainwater, MD       . lactated ringers irrigation solution    PRN Alyson Ingles Candee Furbish, MD   1,000 mL at 06/25/20 0719  . sodium chloride (PF) 0.9 % injection    PRN Cleon Gustin, MD   20 mL at 06/25/20 0719  . sterile water for irrigation for irrigation    PRN Alyson Ingles Candee Furbish, MD   1,000 mL at 06/25/20 0719   Allergies:  Allergies  Allergen Reactions  . Ace Inhibitors Cough  . Lisinopril Cough    Family History  Problem Relation Age of Onset  . Uterine cancer Sister   . Colon cancer Neg Hx   . Colon polyps Neg Hx    Social History:  reports that she quit smoking about 20 years ago. Her smoking use included cigarettes. She smoked 0.40 packs per day. She has never used smokeless tobacco. She reports that she does not drink alcohol and does not use drugs.  Review of Systems  All other systems reviewed and are negative.   Physical Exam:  Vital signs in last 24 hours: Temp:  [98 F (36.7 C)] 98 F (36.7 C) (03/24 0530) Pulse Rate:  [74] 74 (03/24 0530) Resp:  [16] 16 (03/24 0530) BP: (155)/(89) 155/89 (03/24 0530) SpO2:  [98 %] 98 % (03/24 0530) Weight:  [88.5 kg] 88.5 kg (03/24 0545) Physical Exam Vitals reviewed.  Constitutional:  Appearance: Normal appearance.  HENT:     Head: Normocephalic and atraumatic.     Nose: Nose normal.     Mouth/Throat:     Mouth: Mucous membranes are dry.  Eyes:     Extraocular Movements: Extraocular movements intact.     Pupils: Pupils are equal, round, and reactive to light.  Cardiovascular:     Rate and Rhythm: Normal rate and regular rhythm.  Pulmonary:     Effort: No respiratory distress.  Abdominal:     General: Abdomen is flat. There is no distension.  Musculoskeletal:        General: No swelling. Normal range of motion.     Cervical back: Normal range of motion and neck supple.  Skin:    General: Skin is warm and dry.  Neurological:     General: No focal deficit present.     Mental Status: She is alert and oriented to person,  place, and time.  Psychiatric:        Mood and Affect: Mood normal.        Behavior: Behavior normal.        Thought Content: Thought content normal.        Judgment: Judgment normal.     Laboratory Data:  No results found for this or any previous visit (from the past 24 hour(s)). No results found for this or any previous visit (from the past 240 hour(s)). Creatinine: No results for input(s): CREATININE in the last 168 hours. Baseline Creatinine: unknwon  Impression/Assessment:  70yo with a right renal mass  Plan:  We discussed the natural hx of renal masses and the 80/20 malignant/benign likelihood. We disucssed the treatment options including active surveillance. Renal ablation, partial and radical nephrectomy. After discussing the options the patient elects for right partial nephrectomy. Risks/benefits/alternatives discussed  Nicolette Bang 06/25/2020, 7:31 AM

## 2020-06-25 NOTE — Op Note (Signed)
Preoperative diagnosis: Right renal mass  Postop diagnosis: Same  Procedure: 1.  Right robot assisted laparoscopic partial nephrectomy 2. Laparoscopic renal cyst decortication  Attending: Nicolette Bang, MD  Assistant: Clemetine Marker, PA  Anesthesia: General  Estimated blood loss: 50 cc  Drains: 16 French Foley catheter, JP drain  Specimens: Right renal mass with overlying renal fat  Antibiotics: ancef  Findings: 2 arterie and 1 vein. 4cm right anterior mid pole renal mass Warm Ischemia time: 28 minutes The assistant was utilized for retraction, suction, passing suture, deploying the bulldog clamp and removing the bulldog clamp.   Indications: Patient is a 70 year old with a history of 4 cm right renal mass.  The mass was amenable to partial nephrectomy.  After discussing treatment options patient decided to proceed with right robot assisted laparoscopic partial nephrectomy.  Procedure in detail: Prior to procedure consent was obtained. Patient was brought to the operating room and briefing was done sure correct patient, correct procedure, correct site.  General anesthesia was in administered patient was placed in the left lateral decubitus position.  a 28 French catheter was placed. their abdomen and flank was then prepped and draped usual sterile fashion.  A Veress needle was used to obtain pneumoperitoneum.  Once pneumoperitoneum was reestablished to 15 mmHg we then placed a 8 mm camera port lateral to the umbilicus at the latera; edge of rectus.  We then proceeded to place 3 more robotic ports. We then placed an assistant port. We then docked the robot.  We then started this dissection along the white line of [place].  We then reflected the colon medially.  We then kocherized the duodenum. We then identified the psoas muscle.  Once this was done we traced it down to the iliac vessels and identified the ureter.  Once we identified the gonadal vein and ureter were then traced this to the  renal hilum.  The renal vein and renal artery were skeletonized.  We did we identified one renal vein and two renal arteries. We then turned our attention to removal of the renal cysts. We identified 3 renal cysts which were sharply incised and the base was fulgerated.  We then turned our attention to locating the renal mass. We proceeded to remove the overlying renal fat until we identified the renal mass. Once this was done with the circumscribed the mass with electrocautery. We then placed a bulldog clamp on the renal artery and this began our warm ischemia time. Using sharp dissection the mass was removed. We noted a violation of the collecting system which was closed with a 3-0 monocryl. Using a 2-0 V lock suture we oversewed the tumor bed. We then used 0 Vicryl with hem-o-locks in an interrupted fashion to reapproximate the defect in the kidney. A Surgicel bolster was placed under the sutures. Once this was done we removed the bulldog clamp and noted no residual bleeding.  We then placed the specimen in an Endo Catch bag.  Once the specimen was in the Endo Catch bag we then inspected the left kidney and noted no residual bleeding. We then placed a JP drain in the lower quadrant robot port. THis was secured with a 0 nylon.  We then removed our instruments, undocked the robot, and released the pneumoperitoneum. Once the specimen was removed we then closed the camera and assistant ports with 0 Vicryl in interrupted fashion.  The skin was then subcuticularly closed with 4-0 Monocryl.  We then placed Dermabond over all the incisions.  This  included the procedure which resulted by the patient.  Complications: None  Condition: Stable, x-rayed, transferred to PACU.  Plan: Patient is to be admitted for inpatient stay. The foley catheter will be removed in the morning. They will be started on a clear liquid diet POD#1

## 2020-06-25 NOTE — Transfer of Care (Signed)
Immediate Anesthesia Transfer of Care Note  Patient: Lindsey Buckley  Procedure(s) Performed: XI ROBOTIC ASSITED PARTIAL NEPHRECTOMY (Right )  Patient Location: PACU  Anesthesia Type:General  Level of Consciousness: awake and alert   Airway & Oxygen Therapy: Patient Spontanous Breathing and Patient connected to face mask oxygen  Post-op Assessment: Report given to RN and Post -op Vital signs reviewed and stable  Post vital signs: Reviewed and stable  Last Vitals:  Vitals Value Taken Time  BP 154/98 06/25/20 1031  Temp    Pulse 72 06/25/20 1033  Resp 11 06/25/20 1033  SpO2 99 % 06/25/20 1033  Vitals shown include unvalidated device data.  Last Pain:  Vitals:   06/25/20 0549  TempSrc:   PainSc: 0-No pain         Complications: No complications documented.

## 2020-06-25 NOTE — Anesthesia Procedure Notes (Signed)
Procedure Name: Intubation Date/Time: 06/25/2020 7:49 AM Performed by: Sharlette Dense, CRNA Pre-anesthesia Checklist: Patient identified, Emergency Drugs available, Suction available and Patient being monitored Oxygen Delivery Method: Circle system utilized Preoxygenation: Pre-oxygenation with 100% oxygen Induction Type: IV induction Ventilation: Mask ventilation without difficulty Laryngoscope Size: Mac and 3 Grade View: Grade I Tube type: Oral Tube size: 7.5 mm Number of attempts: 1 Airway Equipment and Method: Stylet Placement Confirmation: ETT inserted through vocal cords under direct vision,  positive ETCO2 and breath sounds checked- equal and bilateral Secured at: 21 cm Tube secured with: Tape Dental Injury: Teeth and Oropharynx as per pre-operative assessment

## 2020-06-26 ENCOUNTER — Encounter (HOSPITAL_COMMUNITY): Payer: Self-pay | Admitting: Urology

## 2020-06-26 LAB — HEMOGLOBIN AND HEMATOCRIT, BLOOD
HCT: 36.2 % (ref 36.0–46.0)
Hemoglobin: 11.8 g/dL — ABNORMAL LOW (ref 12.0–15.0)

## 2020-06-26 LAB — CREATININE, FLUID (PLEURAL, PERITONEAL, JP DRAINAGE): Creat, Fluid: 1.2 mg/dL

## 2020-06-26 LAB — BASIC METABOLIC PANEL
Anion gap: 9 (ref 5–15)
BUN: 17 mg/dL (ref 8–23)
CO2: 23 mmol/L (ref 22–32)
Calcium: 8.9 mg/dL (ref 8.9–10.3)
Chloride: 106 mmol/L (ref 98–111)
Creatinine, Ser: 1.06 mg/dL — ABNORMAL HIGH (ref 0.44–1.00)
GFR, Estimated: 57 mL/min — ABNORMAL LOW (ref >60)
Glucose, Bld: 132 mg/dL — ABNORMAL HIGH (ref 70–99)
Potassium: 4.1 mmol/L (ref 3.5–5.1)
Sodium: 138 mmol/L (ref 135–145)

## 2020-06-26 NOTE — Plan of Care (Signed)
Pt alert, no n/v thru the night and will continue plan of care.  Problem: Education: Goal: Knowledge of the prescribed therapeutic regimen will improve Outcome: Progressing   Problem: Bowel/Gastric: Goal: Gastrointestinal status for postoperative course will improve Outcome: Progressing   Problem: Clinical Measurements: Goal: Postoperative complications will be avoided or minimized Outcome: Progressing   Problem: Urinary Elimination: Goal: Ability to achieve and maintain urine output will improve Outcome: Progressing

## 2020-06-26 NOTE — Progress Notes (Signed)
Patient ID: Lindsey Buckley, female   DOB: 16-Nov-1950, 70 y.o.   MRN: [id number] 1 Day Post-Op Subjective: The patient is doing well.  Minimal nausea last night but no vomiting. Pain is adequately controlled. Good UO and minimal JP output.  H/H drop expected and pt asymptomatic. Cr stable.  Objective: Vital signs in last 24 hours: Temp:  [97.5 F (36.4 C)-98.1 F (36.7 C)] 97.9 F (36.6 C) (03/25 0439) Pulse Rate:  [60-79] 71 (03/25 0439) Resp:  [7-22] 18 (03/25 0439) BP: (124-174)/(69-98) 124/69 (03/25 0439) SpO2:  [87 %-100 %] 95 % (03/25 0439)  Intake/Output from previous day: 03/24 0701 - 03/25 0700 In: 1777.6 [I.V.:1677.6; IV Piggyback:100] Out: 1790 [Urine:1705; Drains:35; Blood:50] Intake/Output this shift: No intake/output data recorded.  Physical Exam:  General: Alert and oriented. CV: RRR Lungs: Clear bilaterally. GI: Soft, Nondistended. JP with minimal serosang drainage Incisions: Clean and dry. Urine: Clear Extremities: Nontender, no erythema, no edema.  Lab Results: Recent Labs    06/25/20 1119 06/26/20 0326  HGB 13.6 11.8*  HCT 42.1 36.2          Recent Labs    06/26/20 0326  CREATININE 1.06*           Results for orders placed or performed during the hospital encounter of 06/25/20 (from the past 24 hour(s))  Glucose, capillary     Status: Abnormal   Collection Time: 06/25/20 10:35 AM  Result Value Ref Range   Glucose-Capillary 131 (H) 70 - 99 mg/dL  Hemoglobin and hematocrit, blood     Status: None   Collection Time: 06/25/20 11:19 AM  Result Value Ref Range   Hemoglobin 13.6 12.0 - 15.0 g/dL   HCT 42.1 36.0 - 68.1 %  Basic metabolic panel     Status: Abnormal   Collection Time: 06/26/20  3:26 AM  Result Value Ref Range   Sodium 138 135 - 145 mmol/L   Potassium 4.1 3.5 - 5.1 mmol/L   Chloride 106 98 - 111 mmol/L   CO2 23 22 - 32 mmol/L   Glucose, Bld 132 (H) 70 - 99 mg/dL   BUN 17 8 - 23 mg/dL   Creatinine, Ser 1.06 (H) 0.44 - 1.00  mg/dL   Calcium 8.9 8.9 - 10.3 mg/dL   GFR, Estimated 57 (L) >60 mL/min   Anion gap 9 5 - 15  Hemoglobin and hematocrit, blood     Status: Abnormal   Collection Time: 06/26/20  3:26 AM  Result Value Ref Range   Hemoglobin 11.8 (L) 12.0 - 15.0 g/dL   HCT 36.2 36.0 - 46.0 %    Assessment/Plan: POD# 1 s/p robotic partial nephrectomy.  1) Ambulate, Incentive spirometry 2) Leave on clears for now. Possible advance later today 3) Transition to oral pain medication 4) Dulcolax suppository 5) D/C urethral catheter 6) JP Cr today 7) H/H tomorrow am     LOS: 1 day   Debbrah Alar 06/26/2020, 7:35 AM

## 2020-06-27 LAB — HEMOGLOBIN AND HEMATOCRIT, BLOOD
HCT: 37.5 % (ref 36.0–46.0)
Hemoglobin: 11.9 g/dL — ABNORMAL LOW (ref 12.0–15.0)

## 2020-06-27 NOTE — Progress Notes (Signed)
Urology Inpatient Progress Report    Intv/Subj: Patient with flatus overnight and tolerating clear diet.  Pain well controlled.  JP creatinine consistent with serum.  Active Problems:   Renal mass  Current Facility-Administered Medications  Medication Dose Route Frequency Provider Last Rate Last Admin  . albuterol (PROVENTIL) (2.5 MG/3ML) 0.083% nebulizer solution 2.5 mg  2.5 mg Nebulization TID PRN Debbrah Alar, PA-C   2.5 mg at 06/27/20 0245  . albuterol (VENTOLIN HFA) 108 (90 Base) MCG/ACT inhaler 2 puff  2 puff Inhalation Q4H PRN Dancy, Amanda, PA-C      . belladonna-opium (B&O) suppository 16.2-60mg   1 suppository Rectal Q6H PRN Debbrah Alar, PA-C      . Chlorhexidine Gluconate Cloth 2 % PADS 6 each  6 each Topical Daily McKenzie, Candee Furbish, MD   6 each at 06/26/20 1104  . dapagliflozin propanediol (FARXIGA) tablet 10 mg  10 mg Oral Daily Debbrah Alar, PA-C   10 mg at 06/26/20 1101  . dextrose 5 %-0.45 % sodium chloride infusion   Intravenous Continuous Debbrah Alar, PA-C 100 mL/hr at 06/26/20 0204 New Bag at 06/26/20 0204  . diphenhydrAMINE (BENADRYL) injection 12.5 mg  12.5 mg Intravenous Q6H PRN Dancy, Amanda, PA-C       Or  . diphenhydrAMINE (BENADRYL) 12.5 MG/5ML elixir 12.5 mg  12.5 mg Oral Q6H PRN Dancy, Amanda, PA-C      . metoprolol succinate (TOPROL-XL) 24 hr tablet 100 mg  100 mg Oral BID Debbrah Alar, PA-C   100 mg at 06/26/20 2137  . NIFEdipine (PROCARDIA-XL/NIFEDICAL-XL) 24 hr tablet 90 mg  90 mg Oral Daily Debbrah Alar, PA-C   90 mg at 06/26/20 1100  . ondansetron (ZOFRAN) injection 4 mg  4 mg Intravenous Q4H PRN Debbrah Alar, PA-C   4 mg at 06/25/20 1620  . oxyCODONE (Oxy IR/ROXICODONE) immediate release tablet 5 mg  5 mg Oral Q4H PRN Debbrah Alar, PA-C   5 mg at 06/26/20 2137  . polyvinyl alcohol (LIQUIFILM TEARS) 1.4 % ophthalmic solution 1 drop  1 drop Both Eyes TID PRN Debbrah Alar, PA-C      . rosuvastatin (CRESTOR) tablet 20 mg  20 mg Oral Daily Dancy,  Amanda, PA-C   20 mg at 06/26/20 1059  . senna-docusate (Senokot-S) tablet 2 tablet  2 tablet Oral QHS Debbrah Alar, PA-C   2 tablet at 06/26/20 2137     Objective: Vital: Vitals:   06/26/20 1500 06/26/20 1940 06/27/20 0245 06/27/20 0438  BP:  (!) 156/86  (!) 147/84  Pulse:  84  86  Resp:  18  16  Temp:  99.3 F (37.4 C)  99.5 F (37.5 C)  TempSrc:  Oral  Oral  SpO2: 92% 91% 92% 90%  Weight:      Height:       I/Os: I/O last 3 completed shifts: In: 1197.8 [I.V.:1197.8] Out: 14 [Urine:850; Drains:20]  Physical Exam:  General: Patient is in no apparent distress Lungs: Normal respiratory effort, chest expands symmetrically. GI: Incisions are c/d/i.  The abdomen is soft and nontender JP drain with serosanguinous drainage  Ext: lower extremities symmetric  Lab Results: Recent Labs    06/25/20 1119 06/26/20 0326 06/27/20 0425  HGB 13.6 11.8* 11.9*  HCT 42.1 36.2 37.5   Recent Labs    06/26/20 0326  NA 138  K 4.1  CL 106  CO2 23  GLUCOSE 132*  BUN 17  CREATININE 1.06*  CALCIUM 8.9   No results for input(s): LABPT, INR in  the last 72 hours. No results for input(s): LABURIN in the last 72 hours. Results for orders placed or performed during the hospital encounter of 04/27/20  SARS CORONAVIRUS 2 (TAT 6-24 HRS) Nasopharyngeal Nasopharyngeal Swab     Status: Abnormal   Collection Time: 04/27/20  1:21 PM   Specimen: Nasopharyngeal Swab  Result Value Ref Range Status   SARS Coronavirus 2 POSITIVE (A) NEGATIVE Final    Comment: (NOTE) SARS-CoV-2 target nucleic acids are DETECTED.  The SARS-CoV-2 RNA is generally detectable in upper and lower respiratory specimens during the acute phase of infection. Positive results are indicative of the presence of SARS-CoV-2 RNA. Clinical correlation with patient history and other diagnostic information is  necessary to determine patient infection status. Positive results do not rule out bacterial infection or co-infection  with other viruses.  The expected result is Negative.  Fact Sheet for Patients: SugarRoll.be  Fact Sheet for Healthcare Providers: https://www.woods-mathews.com/  This test is not yet approved or cleared by the [place] FDA and  has been authorized for detection and/or diagnosis of SARS-CoV-2 by FDA under an Emergency Use Authorization (EUA). This EUA will remain  in effect (meaning this test can be used) for the duration of the COVID-19 declaration under Section 564(b)(1) of the Act, 21 U. S.C. section 360bbb-3(b)(1), unless the authorization is terminated or revoked sooner.   Performed at [facility], [place] [address], [place], [place] [address]     Studies/Results: No results found.  Assessment: 70 year old woman postop day 2 status post right robotic laparoscopic assisted partial nephrectomy and laparoscopic renal cyst decortication.  Hemoglobin and creatinine stable.  Plan: -JP creatinine consistent with serum, DC JP today -advance to regular diet -PO pain meds -encourage ambulation and IS -anticipate dc this afternoon    Jacalyn Lefevre, MD Urology 06/27/2020, 7:10 AM

## 2020-06-27 NOTE — Discharge Summary (Signed)
Date of admission: 06/25/2020  Date of discharge: 06/28/2020  Admission diagnosis: Right renal mass  Discharge diagnosis: Right renal mass  Secondary diagnoses:  Patient Active Problem List   Diagnosis Date Noted  . Renal mass 06/25/2020  . History of colonic polyps 06/09/2020  . Heme positive stool 03/10/2020  . Change in bowel habits 03/10/2020  . Left sided abdominal pain 03/10/2020  . Chest pain 05/16/2012  . HYPERLIPIDEMIA 12/30/2009  . GASTROESOPHAGEAL REFLUX DISEASE 12/30/2009  . Palpitations 12/30/2009  . HYPERTENSION 08/22/2008    Procedures performed: Procedure(s): XI ROBOTIC ASSITED RIGHT PARTIAL NEPHRECTOMY and renal cyst decortication  History and Physical: For full details, please see admission history and physical. Briefly, Keymiah D Martinique is a 70 y.o. year old patient with right renal mass underwent robotic assisted laparoscopic right partial nephrectomy and renal cyst decortication.   Hospital Course: Patient tolerated the procedure well.  She was then transferred to the floor after an uneventful PACU stay.  Her hospital course was uncomplicated.  On POD#3 she had met discharge criteria: was eating a regular diet, was up and ambulating independently,  pain was well controlled, was voiding without a catheter, and was ready to for discharge.   Laboratory values:  Recent Labs    06/25/20 1119 06/26/20 0326 06/27/20 0425  HGB 13.6 11.8* 11.9*  HCT 42.1 36.2 37.5   Recent Labs    06/26/20 0326  NA 138  K 4.1  CL 106  CO2 23  GLUCOSE 132*  BUN 17  CREATININE 1.06*  CALCIUM 8.9   No results for input(s): LABPT, INR in the last 72 hours. No results for input(s): LABURIN in the last 72 hours. Results for orders placed or performed during the hospital encounter of 04/27/20  SARS CORONAVIRUS 2 (TAT 6-24 HRS) Nasopharyngeal Nasopharyngeal Swab     Status: Abnormal   Collection Time: 04/27/20  1:21 PM   Specimen: Nasopharyngeal Swab  Result Value Ref  Range Status   SARS Coronavirus 2 POSITIVE (A) NEGATIVE Final    Comment: (NOTE) SARS-CoV-2 target nucleic acids are DETECTED.  The SARS-CoV-2 RNA is generally detectable in upper and lower respiratory specimens during the acute phase of infection. Positive results are indicative of the presence of SARS-CoV-2 RNA. Clinical correlation with patient history and other diagnostic information is  necessary to determine patient infection status. Positive results do not rule out bacterial infection or co-infection with other viruses.  The expected result is Negative.  Fact Sheet for Patients: SugarRoll.be  Fact Sheet for Healthcare Providers: https://www.woods-mathews.com/  This test is not yet approved or cleared by the [place] FDA and  has been authorized for detection and/or diagnosis of SARS-CoV-2 by FDA under an Emergency Use Authorization (EUA). This EUA will remain  in effect (meaning this test can be used) for the duration of the COVID-19 declaration under Section 564(b)(1) of the Act, 21 U. S.C. section 360bbb-3(b)(1), unless the authorization is terminated or revoked sooner.   Performed at [facility], [place] [address], [place], [place] [address]     Disposition: Home  Discharge instruction: The patient was instructed to be ambulatory but told to refrain from heavy lifting, strenuous activity, or driving.   Discharge medications:  Allergies as of 06/28/2020      Reactions   Ace Inhibitors Cough   Lisinopril Cough      Medication List    STOP taking these medications   multivitamin with minerals Tabs tablet   Vitamin D-3 125 MCG (5000  UT) Tabs     TAKE these medications   albuterol 108 (90 Base) MCG/ACT inhaler Commonly known as: VENTOLIN HFA Inhale 2 puffs into the lungs every 4 (four) hours as needed.   albuterol (2.5 MG/3ML) 0.083% nebulizer solution Commonly known as: PROVENTIL Take 2.5 mg by  nebulization 3 (three) times daily as needed for wheezing or shortness of breath.   augmented betamethasone dipropionate 0.05 % cream Commonly known as: DIPROLENE-AF Apply 1 application topically daily as needed (eczema).   carboxymethylcellulose 0.5 % Soln Commonly known as: REFRESH PLUS Place 1 drop into both eyes 3 (three) times daily as needed (dry/irritated eyes.).   diphenhydrAMINE 25 mg capsule Commonly known as: BENADRYL Take 50 mg by mouth every 6 (six) hours as needed for itching (eczema).   Farxiga 10 MG Tabs tablet Generic drug: dapagliflozin propanediol Take 10 mg by mouth daily.   HYDROcodone-acetaminophen 5-325 MG tablet Commonly known as: Norco Take 1-2 tablets by mouth every 6 (six) hours as needed for moderate pain.   losartan 50 MG tablet Commonly known as: COZAAR Take 50 mg by mouth daily. What changed: Another medication with the same name was removed. Continue taking this medication, and follow the directions you see here.   metoprolol succinate 100 MG 24 hr tablet Commonly known as: TOPROL-XL TAKE 1 TABLET(100 MG) BY MOUTH TWICE DAILY MUST MAKE APPT FOR MORE REFILLS What changed:   how much to take  how to take this  when to take this   NIFEdipine 90 MG 24 hr tablet Commonly known as: PROCARDIA XL/NIFEDICAL-XL Take 90 mg by mouth daily.   rosuvastatin 20 MG tablet Commonly known as: CRESTOR Take 20 mg by mouth daily.   Xiidra 5 % Soln Generic drug: Lifitegrast Place 1 drop into both eyes daily as needed for dry eyes.       Followup:   Follow-up Information    McKenzie, Candee Furbish, MD Follow up on 07/03/2020.   Specialty: Urology Why: at 10:45 Contact information: [place] [address] [phone]

## 2020-06-28 MED ORDER — PANTOPRAZOLE SODIUM 40 MG PO TBEC
40.0000 mg | DELAYED_RELEASE_TABLET | Freq: Every day | ORAL | Status: DC
  Administered 2020-06-28: 40 mg via ORAL
  Filled 2020-06-28: qty 1

## 2020-06-29 LAB — SURGICAL PATHOLOGY

## 2020-07-01 ENCOUNTER — Telehealth: Payer: Self-pay

## 2020-07-01 ENCOUNTER — Other Ambulatory Visit: Payer: Self-pay | Admitting: Urology

## 2020-07-01 MED ORDER — HYDROCODONE-ACETAMINOPHEN 5-325 MG PO TABS: 1.0000 | ORAL_TABLET | Freq: Four times a day (QID) | ORAL | 0 refills | Status: DC | PRN

## 2020-07-01 NOTE — Telephone Encounter (Signed)
Pt  called saying she had surgery last week and was discharged without pain med. She has called pharmacy two days trying to get it. I spoke with Dr. Alyson Ingles and he sent another prescription in. Pt notified.

## 2020-07-03 ENCOUNTER — Encounter: Payer: Self-pay | Admitting: Urology

## 2020-07-03 ENCOUNTER — Other Ambulatory Visit: Payer: Self-pay

## 2020-07-03 ENCOUNTER — Ambulatory Visit (INDEPENDENT_AMBULATORY_CARE_PROVIDER_SITE_OTHER): Payer: Medicare Other | Admitting: Urology

## 2020-07-03 VITALS — BP 143/87 | HR 73 | Temp 98.6°F | Ht 64.0 in | Wt 193.8 lb

## 2020-07-03 DIAGNOSIS — C641 Malignant neoplasm of right kidney, except renal pelvis: Secondary | ICD-10-CM

## 2020-07-03 DIAGNOSIS — N2889 Other specified disorders of kidney and ureter: Secondary | ICD-10-CM

## 2020-07-03 LAB — URINALYSIS, ROUTINE W REFLEX MICROSCOPIC
Bilirubin, UA: NEGATIVE
Ketones, UA: NEGATIVE
Nitrite, UA: NEGATIVE
Specific Gravity, UA: 1.01 (ref 1.005–1.030)
Urobilinogen, Ur: 0.2 mg/dL (ref 0.2–1.0)
pH, UA: 5.5 (ref 5.0–7.5)

## 2020-07-03 LAB — MICROSCOPIC EXAMINATION

## 2020-07-03 NOTE — Progress Notes (Signed)
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
## 2020-07-03 NOTE — Patient Instructions (Signed)
Kidney Cancer  Kidney cancer is an abnormal growth of cells in one or both kidneys. The kidneys filter waste from your blood and produce urine. Kidney cancer may spread to other parts of your body. This type of cancer may also be called renal cell carcinoma. What are the causes? The cause of this condition is not always known. In some cases, abnormal changes to genes (genetic mutations) can cause cells to form cancer. What increases the risk? You may be more likely to develop kidney cancer if you:  Are over age 60. The risk increases with age.  Have a family history of kidney cancer.  Are of African-American, Native American, or Native Al[place]an descent.  Smoke.  Are female.  Are obese.  Have high blood pressure (hypertension).  Have advanced kidney disease, especially if you need long-term dialysis.  Have certain conditions that are passed from parent to child (inherited), such as von Hippel-Lindau disease, tuberous sclerosis, or hereditary papillary renal carcinoma.  Have been exposed to certain chemicals. What are the signs or symptoms? In the early stages, kidney cancer does not cause symptoms. As the cancer grows, symptoms may include:  Blood in the urine.  Pain in the upper back or abdomen, just below the rib cage. You may feel pain on one or both sides of the body.  Fatigue.  Unexplained weight loss.  Fever. How is this diagnosed? This condition may be diagnosed based on:  Your symptoms and medical history.  A physical exam.  Blood and urine tests.  X-rays.  Imaging tests, such as CT scans, MRIs, and PET scans.  Having dye injected into your blood through an IV, and then having X-rays taken of: ? Your kidneys and the rest of the organs involved in making and storing urine (intravenous pyelogram). ? Your blood vessels (angiogram).  Removal and testing of a kidney tissue sample (biopsy). Your cancer will be assessed (staged), based on how severe it is and how  much it has spread. How is this treated? Treatment depends on the type and stage of the cancer. Treatment may include one or more of the following:  Surgery. This may include surgery to remove: ? Just the tumor (nephron-sparing surgery). ? The entire kidney (nephrectomy). ? The kidney, some of the surrounding healthy tissue, nearby lymph nodes, and the adrenal gland in certain cases (radical nephrectomy).  Medicines that kill cancer cells (chemotherapy).  High-energy rays that kill cancer cells (radiation therapy).  Targeted therapy. This targets specific parts of cancer cells and the area around them to block the growth and the spread of the cancer. Targeted therapy can help to limit the damage to healthy cells.  Medicines that help your body's disease-fighting system (immune system) fight cancer cells (immunotherapy).  Freezing cancer cells using gas or liquid that is delivered through a needle (cryoablation).  Destroying cancer cells using high-energy radio waves that are delivered through a needle-like probe (radiofrequency ablation).  A procedure to block the artery that supplies blood to the tumor, which kills the cancer cells (embolization). Follow these instructions at home: Eating and drinking  Some of your treatments might affect your appetite and your ability to chew and swallow. If you are having problems eating, or if you do not have an appetite, meet with a diet and nutrition specialist (dietitian).  If you have side effects that affect eating, it may help to: ? Eat smaller meals and snacks often. ? Drink high-nutrition and high-calorie shakes or supplements. ? Eat bland and soft   foods that are easy to eat. ? Not eat foods that are hot, spicy, or hard to swallow. Lifestyle  Do not drink alcohol.  Do not use any products that contain nicotine or tobacco, such as cigarettes and e-cigarettes. If you need help quitting, ask your health care provider. General  instructions  Take over-the-counter and prescription medicines only as told by your health care provider. This includes vitamins, supplements, and herbal products.  Consider joining a support group to help you cope with the stress of having kidney cancer.  Work with your health care provider to manage any side effects of treatment.  Keep all follow-up visits as told by your health care provider. This is important.   Where to find more information  American Cancer Society: https://www.cancer.org  National Cancer Institute (NCI): https://www.cancer.gov Contact a health care provider if you:  Notice that you bruise or bleed easily.  Are losing weight without trying.  Have new or increased fatigue or weakness. Get help right away if you have:  Blood in your urine.  A sudden increase in pain.  A fever.  Shortness of breath.  Chest pain.  Yellow skin or whites of your eyes (jaundice). Summary  Kidney cancer is an abnormal growth of cells (tumor) in one or both kidneys. Tumors may spread to other parts of your body.  In the early stages, kidney cancer does not cause symptoms. As the cancer grows, symptoms may include blood in the urine, pain in the upper back or abdomen, unexplained weight loss, fatigue, and fever.  Treatment depends on the type and stage of the cancer. It may include surgery to remove the tumor, procedures and medicines to kill the cancer cells, or medicines to help your body fight cancer cells. This information is not intended to replace advice given to you by your health care provider. Make sure you discuss any questions you have with your health care provider. Document Revised: 04/12/2017 Document Reviewed: 04/09/2017 Elsevier Patient Education  2021 Elsevier Inc.  

## 2020-09-21 ENCOUNTER — Other Ambulatory Visit: Payer: Medicare Other

## 2020-09-21 ENCOUNTER — Other Ambulatory Visit: Payer: Self-pay

## 2020-09-21 DIAGNOSIS — C641 Malignant neoplasm of right kidney, except renal pelvis: Secondary | ICD-10-CM

## 2020-09-22 LAB — COMPREHENSIVE METABOLIC PANEL
ALT: 18 IU/L (ref 0–32)
AST: 23 IU/L (ref 0–40)
Albumin/Globulin Ratio: 1.7 (ref 1.2–2.2)
Albumin: 4.5 g/dL (ref 3.8–4.8)
Alkaline Phosphatase: 79 IU/L (ref 44–121)
BUN/Creatinine Ratio: 11 — ABNORMAL LOW (ref 12–28)
BUN: 12 mg/dL (ref 8–27)
Bilirubin Total: 0.4 mg/dL (ref 0.0–1.2)
CO2: 24 mmol/L (ref 20–29)
Calcium: 10 mg/dL (ref 8.7–10.3)
Chloride: 103 mmol/L (ref 96–106)
Creatinine, Ser: 1.05 mg/dL — ABNORMAL HIGH (ref 0.57–1.00)
Globulin, Total: 2.7 g/dL (ref 1.5–4.5)
Glucose: 102 mg/dL — ABNORMAL HIGH (ref 65–99)
Potassium: 4.3 mmol/L (ref 3.5–5.2)
Sodium: 141 mmol/L (ref 134–144)
Total Protein: 7.2 g/dL (ref 6.0–8.5)
eGFR: 58 mL/min/{1.73_m2} — ABNORMAL LOW (ref >59)

## 2020-09-25 ENCOUNTER — Ambulatory Visit (HOSPITAL_COMMUNITY): Payer: Medicare Other

## 2020-09-30 NOTE — Progress Notes (Signed)
Sent via mail 

## 2020-10-02 ENCOUNTER — Ambulatory Visit (HOSPITAL_COMMUNITY): Payer: Medicare Other

## 2020-10-02 ENCOUNTER — Ambulatory Visit (INDEPENDENT_AMBULATORY_CARE_PROVIDER_SITE_OTHER): Payer: Medicare Other | Admitting: Urology

## 2020-10-02 ENCOUNTER — Other Ambulatory Visit: Payer: Self-pay

## 2020-10-02 ENCOUNTER — Encounter: Payer: Self-pay | Admitting: Urology

## 2020-10-02 VITALS — BP 129/75 | HR 79

## 2020-10-02 DIAGNOSIS — C641 Malignant neoplasm of right kidney, except renal pelvis: Secondary | ICD-10-CM

## 2020-10-02 LAB — MICROSCOPIC EXAMINATION
Epithelial Cells (non renal): >10 /hpf — AB (ref 0–10)
Renal Epithel, UA: NONE SEEN /hpf

## 2020-10-02 LAB — URINALYSIS, ROUTINE W REFLEX MICROSCOPIC
Bilirubin, UA: NEGATIVE
Ketones, UA: NEGATIVE
Nitrite, UA: NEGATIVE
Specific Gravity, UA: 1.02 (ref 1.005–1.030)
Urobilinogen, Ur: 0.2 mg/dL (ref 0.2–1.0)
pH, UA: 5.5 (ref 5.0–7.5)

## 2020-10-02 NOTE — Progress Notes (Signed)
Urological Symptom Review  Patient is experiencing the following symptoms: Get up at night to urinate   Review of Systems  Gastrointestinal (upper)  : Indigestion/heartburn  Gastrointestinal (lower) : Negative for lower GI symptoms  Constitutional : Negative for symptoms  Skin: Skin rash/lesion itching  Eyes: Negative for eye symptoms  Ear/Nose/Throat : Negative for Ear/Nose/Throat symptoms  Hematologic/Lymphatic: Negative for Hematologic/Lymphatic symptoms  Cardiovascular : Negative for cardiovascular symptoms  Respiratory : Cough  Endocrine: Negative for endocrine symptoms  Musculoskeletal: Negative for musculoskeletal symptoms  Neurological: Negative for neurological symptoms  Psychologic: Negative for psychiatric symptoms

## 2020-10-02 NOTE — Patient Instructions (Signed)
Kidney Cancer  Kidney cancer is an abnormal growth of cells in one or both kidneys. The kidneys filter waste from your blood and produce urine. Kidney cancer may spread to other parts of your body. This type of cancer may also be calledrenal cell carcinoma. What are the causes? The cause of this condition is not always known. In some cases, abnormal changes to genes (genetic mutations) can cause cells to form cancer. What increases the risk? You may be more likely to develop kidney cancer if you: Are over age 60. The risk increases with age. Have a family history of kidney cancer. Are of African-American, Native American, or Native [place]an descent. Smoke. Are female. Are obese. Have high blood pressure (hypertension). Have advanced kidney disease, especially if you need long-term dialysis. Have certain conditions that are passed from parent to child (inherited), such as von Hippel-Lindau disease, tuberous sclerosis, or hereditary papillary renal carcinoma. Have been exposed to certain chemicals. What are the signs or symptoms? In the early stages, kidney cancer does not cause symptoms. As the cancer grows, symptoms may include: Blood in the urine. Pain in the upper back or abdomen, just below the rib cage. You may feel pain on one or both sides of the body. Fatigue. Unexplained weight loss. Fever. How is this diagnosed? This condition may be diagnosed based on: Your symptoms and medical history. A physical exam. Blood and urine tests. X-rays. Imaging tests, such as CT scans, MRIs, and PET scans. Having dye injected into your blood through an IV, and then having X-rays taken of: Your kidneys and the rest of the organs involved in making and storing urine (intravenous pyelogram). Your blood vessels (angiogram). Removal and testing of a kidney tissue sample (biopsy). Your cancer will be assessed (staged), based on how severe it is and how much it has spread. How is this  treated? Treatment depends on the type and stage of the cancer. Treatment may include one or more of the following: Surgery. This may include surgery to remove: Just the tumor (nephron-sparing surgery). The entire kidney (nephrectomy). The kidney, some of the surrounding healthy tissue, nearby lymph nodes, and the adrenal gland in certain cases (radical nephrectomy). Medicines that kill cancer cells (chemotherapy). High-energy rays that kill cancer cells (radiation therapy). Targeted therapy. This targets specific parts of cancer cells and the area around them to block the growth and the spread of the cancer. Targeted therapy can help to limit the damage to healthy cells. Medicines that help your body's disease-fighting system (immune system) fight cancer cells (immunotherapy). Freezing cancer cells using gas or liquid that is delivered through a needle (cryoablation). Destroying cancer cells using high-energy radio waves that are delivered through a needle-like probe (radiofrequency ablation). A procedure to block the artery that supplies blood to the tumor, which kills the cancer cells (embolization). Follow these instructions at home: Eating and drinking Some of your treatments might affect your appetite and your ability to chew and swallow. If you are having problems eating, or if you do not have an appetite, meet with a diet and nutrition specialist (dietitian). If you have side effects that affect eating, it may help to: Eat smaller meals and snacks often. Drink high-nutrition and high-calorie shakes or supplements. Eat bland and soft foods that are easy to eat. Not eat foods that are hot, spicy, or hard to swallow. Lifestyle Do not drink alcohol. Do not use any products that contain nicotine or tobacco, such as cigarettes and e-cigarettes. If you need help   quitting, ask your health care provider. General instructions  Take over-the-counter and prescription medicines only as told by  your health care provider. This includes vitamins, supplements, and herbal products. Consider joining a support group to help you cope with the stress of having kidney cancer. Work with your health care provider to manage any side effects of treatment. Keep all follow-up visits as told by your health care provider. This is important.  Where to find more information American Cancer Society: https://www.cancer.org National Cancer Institute (NCI): https://www.cancer.gov Contact a health care provider if you: Notice that you bruise or bleed easily. Are losing weight without trying. Have new or increased fatigue or weakness. Get help right away if you have: Blood in your urine. A sudden increase in pain. A fever. Shortness of breath. Chest pain. Yellow skin or whites of your eyes (jaundice). Summary Kidney cancer is an abnormal growth of cells (tumor) in one or both kidneys. Tumors may spread to other parts of your body. In the early stages, kidney cancer does not cause symptoms. As the cancer grows, symptoms may include blood in the urine, pain in the upper back or abdomen, unexplained weight loss, fatigue, and fever. Treatment depends on the type and stage of the cancer. It may include surgery to remove the tumor, procedures and medicines to kill the cancer cells, or medicines to help your body fight cancer cells. This information is not intended to replace advice given to you by your health care provider. Make sure you discuss any questions you have with your healthcare provider. Document Revised: 04/12/2017 Document Reviewed: 04/09/2017 Elsevier Patient Education  2022 Elsevier Inc.  

## 2020-10-02 NOTE — Progress Notes (Signed)
10/02/2020 10:24 AM   Lindsey Buckley May 19, 1950 [id number]  Referring provider: Lucia Gaskins, MD [place],  [place] [address]  [place] left RCC   HPI: [name] is a 70yo here for followup for T1b left RCC. Creatinine 1.05. She did not get her CXr or CT abd/pelvis. She has mild intermittent left flank pain. No LUTS.    PMH: Past Medical History:  Diagnosis Date   Asthma 2007   smoke trigged   Chest pain    history of   Diabetes mellitus without complication ([place]) 2683   borderline diabetes per pt per MD    Dysrhythmia 2003   Eczema    GERD (gastroesophageal reflux disease)    Hyperlipidemia    Other and unspecified hyperlipidemia    Pulmonary congestion and hypostasis 2003   Pulmonary edema 2003   Unspecified essential hypertension     Surgical History: Past Surgical History:  Procedure Laterality Date   ABDOMINAL HYSTERECTOMY     COLONOSCOPY     remote past in [place] PROPOFOL N/A 04/07/2020   non-bleeding internal hemorrhoids, multiple diverticula in entire colon, multiple polyps. Tubular adenomas. Surveillance in 5 years.    POLYPECTOMY  04/07/2020   Procedure: POLYPECTOMY;  Surgeon: [name], DO;  [facility];  Service: Endoscopy;;   ROBOTIC ASSITED PARTIAL NEPHRECTOMY Right 06/25/2020   Procedure: XI ROBOTIC ASSITED PARTIAL NEPHRECTOMY;  Surgeon: Cleon Gustin, MD;  Location: [facility];  Service: Urology;  Laterality: Right;   TUBAL LIGATION      Home Medications:  Allergies as of 10/02/2020       Reactions   Ace Inhibitors Cough   Lisinopril Cough        Medication List        Accurate as of October 02, 2020 10:24 AM. If you have any questions, ask your nurse or doctor.          albuterol 108 (90 Base) MCG/ACT inhaler Commonly known as: VENTOLIN HFA Inhale 2 puffs into the lungs every 4 (four) hours as needed.   albuterol (2.5 MG/3ML) 0.083% nebulizer solution Commonly known  as: PROVENTIL Take 2.5 mg by nebulization 3 (three) times daily as needed for wheezing or shortness of breath.   augmented betamethasone dipropionate 0.05 % cream Commonly known as: DIPROLENE-AF Apply 1 application topically daily as needed (eczema).   carboxymethylcellulose 0.5 % Soln Commonly known as: REFRESH PLUS Place 1 drop into both eyes 3 (three) times daily as needed (dry/irritated eyes.).   diphenhydrAMINE 25 mg capsule Commonly known as: BENADRYL Take 50 mg by mouth every 6 (six) hours as needed for itching (eczema).   Farxiga 10 MG Tabs tablet Generic drug: dapagliflozin propanediol Take 10 mg by mouth daily.   HYDROcodone-acetaminophen 5-325 MG tablet Commonly known as: Norco Take 1-2 tablets by mouth every 6 (six) hours as needed for moderate pain.   losartan 50 MG tablet Commonly known as: COZAAR Take 50 mg by mouth daily.   metoprolol succinate 100 MG 24 hr tablet Commonly known as: TOPROL-XL TAKE 1 TABLET(100 MG) BY MOUTH TWICE DAILY MUST MAKE APPT FOR MORE REFILLS What changed:  how much to take how to take this when to take this   NIFEdipine 90 MG 24 hr tablet Commonly known as: PROCARDIA XL/NIFEDICAL-XL Take 90 mg by mouth daily.   rosuvastatin 20 MG tablet Commonly known as: CRESTOR Take 20 mg by mouth daily.   Xiidra 5 % Soln Generic  drug: Lifitegrast Place 1 drop into both eyes daily as needed for dry eyes.        Allergies:  Allergies  Allergen Reactions   Ace Inhibitors Cough   Lisinopril Cough    Family History: Family History  Problem Relation Age of Onset   Uterine cancer Sister    Colon cancer Neg Hx    Colon polyps Neg Hx     Social History:  reports that she quit smoking about 20 years ago. Her smoking use included cigarettes. She smoked an average of 0.40 packs per day. She has never used smokeless tobacco. She reports that she does not drink alcohol and does not use drugs.  ROS: All other review of systems were  reviewed and are negative except what is noted above in HPI  Physical Exam: BP 129/75   Pulse 79   Constitutional:  Alert and oriented, No acute distress. HEENT: [place] AT, moist mucus membranes.  Trachea midline, no masses. Cardiovascular: No clubbing, cyanosis, or edema. Respiratory: Normal respiratory effort, no increased work of breathing. GI: Abdomen is soft, nontender, nondistended, no abdominal masses GU: No CVA tenderness.  Lymph: No cervical or inguinal lymphadenopathy. Skin: No rashes, bruises or suspicious lesions. Neurologic: Grossly intact, no focal deficits, moving all 4 extremities. Psychiatric: Normal mood and affect.  Laboratory Data: Lab Results  Component Value Date   WBC 6.9 06/15/2020   HGB 11.9 (L) 06/27/2020   HCT 37.5 06/27/2020   MCV 91.3 06/15/2020   PLT 284 06/15/2020    Lab Results  Component Value Date   CREATININE 1.05 (H) 09/21/2020    No results found for: PSA  No results found for: TESTOSTERONE  Lab Results  Component Value Date   HGBA1C 5.7 (H) 04/21/2020    Urinalysis    Component Value Date/Time   COLORURINE STRAW (A) 02/17/2020 1308   APPEARANCEUR Hazy (A) 07/03/2020 1147   LABSPEC 1.035 (H) 02/17/2020 1308   PHURINE 6.0 02/17/2020 1308   GLUCOSEU 3+ (A) 07/03/2020 1147   HGBUR NEGATIVE 02/17/2020 1308   BILIRUBINUR Negative 07/03/2020 1147   KETONESUR 5 (A) 02/17/2020 1308   PROTEINUR 2+ (A) 07/03/2020 1147   PROTEINUR 100 (A) 02/17/2020 1308   NITRITE Negative 07/03/2020 1147   NITRITE NEGATIVE 02/17/2020 1308   LEUKOCYTESUR 1+ (A) 07/03/2020 1147   LEUKOCYTESUR TRACE (A) 02/17/2020 1308    Lab Results  Component Value Date   LABMICR See below: 07/03/2020   WBCUA 0-5 07/03/2020   LABEPIT 0-10 07/03/2020   MUCUS Present 07/03/2020   BACTERIA Few 07/03/2020    Pertinent Imaging:  No results found for this or any previous visit.  No results found for this or any previous visit.  No results found for this or any  previous visit.  No results found for this or any previous visit.  No results found for this or any previous visit.  No results found for this or any previous visit.  No results found for this or any previous visit.  No results found for this or any previous visit.   Assessment & Plan:    1. Renal cell cancer, right ([place]) Reschedule CXR and CT abd/pelvis, will call with result -RCT 6 months with CMP and CXR - Urinalysis, Routine w reflex microscopic   No follow-ups on file.  Nicolette Bang, MD  [facility] [place]

## 2020-10-03 ENCOUNTER — Other Ambulatory Visit: Payer: Self-pay | Admitting: Cardiology

## 2020-11-21 ENCOUNTER — Emergency Department (HOSPITAL_COMMUNITY): Payer: Medicare Other

## 2020-11-21 ENCOUNTER — Other Ambulatory Visit: Payer: Self-pay

## 2020-11-21 ENCOUNTER — Emergency Department (HOSPITAL_COMMUNITY)
Admission: EM | Admit: 2020-11-21 | Discharge: 2020-11-21 | Disposition: A | Discharge status: Home / Self Care | Payer: Medicare Other | Attending: Student | Admitting: Student

## 2020-11-21 ENCOUNTER — Encounter (HOSPITAL_COMMUNITY): Payer: Self-pay

## 2020-11-21 DIAGNOSIS — S8265XA Nondisplaced fracture of lateral malleolus of left fibula, initial encounter for closed fracture: Secondary | ICD-10-CM | POA: Insufficient documentation

## 2020-11-21 DIAGNOSIS — X58XXXA Exposure to other specified factors, initial encounter: Secondary | ICD-10-CM | POA: Diagnosis not present

## 2020-11-21 DIAGNOSIS — J45909 Unspecified asthma, uncomplicated: Secondary | ICD-10-CM | POA: Diagnosis not present

## 2020-11-21 DIAGNOSIS — Z79899 Other long term (current) drug therapy: Secondary | ICD-10-CM | POA: Diagnosis not present

## 2020-11-21 DIAGNOSIS — I10 Essential (primary) hypertension: Secondary | ICD-10-CM | POA: Insufficient documentation

## 2020-11-21 DIAGNOSIS — Z87891 Personal history of nicotine dependence: Secondary | ICD-10-CM | POA: Insufficient documentation

## 2020-11-21 DIAGNOSIS — Z85528 Personal history of other malignant neoplasm of kidney: Secondary | ICD-10-CM | POA: Diagnosis not present

## 2020-11-21 DIAGNOSIS — E119 Type 2 diabetes mellitus without complications: Secondary | ICD-10-CM | POA: Diagnosis not present

## 2020-11-21 DIAGNOSIS — S99912A Unspecified injury of left ankle, initial encounter: Secondary | ICD-10-CM | POA: Diagnosis present

## 2020-11-21 NOTE — ED Provider Notes (Signed)
[facility] EMERGENCY DEPARTMENT Provider Note   CSN: [id number] Arrival date & time: 11/21/20  1006     History Chief Complaint  Patient presents with   Ankle Pain    Lindsey Buckley is a 70 y.o. female.  The history is provided by the patient. No language interpreter was used.  Ankle Pain Location:  Ankle Time since incident:  1 day Injury: yes   Ankle location:  R ankle Pain details:    Quality:  Aching   Radiates to:  Does not radiate   Severity:  Moderate   Timing:  Constant Chronicity:  New Dislocation: no   Foreign body present:  No foreign bodies Relieved by:  Nothing Worsened by:  Nothing Ineffective treatments:  None tried Associated symptoms: swelling       Past Medical History:  Diagnosis Date   Asthma 2007   smoke trigged   Chest pain    history of   Diabetes mellitus without complication ([place]) 3016   borderline diabetes per pt per MD    Dysrhythmia 2003   Eczema    GERD (gastroesophageal reflux disease)    Hyperlipidemia    Other and unspecified hyperlipidemia    Pulmonary congestion and hypostasis 2003   Pulmonary edema 2003   Unspecified essential hypertension     Patient Active Problem List   Diagnosis Date Noted   Renal cell cancer, right ([place]) 07/03/2020   Renal mass 06/25/2020   History of colonic polyps 06/09/2020   Heme positive stool 03/10/2020   Change in bowel habits 03/10/2020   Left sided abdominal pain 03/10/2020   Chest pain 05/16/2012   HYPERLIPIDEMIA 12/30/2009   GASTROESOPHAGEAL REFLUX DISEASE 12/30/2009   Palpitations 12/30/2009   HYPERTENSION 08/22/2008    Past Surgical History:  Procedure Laterality Date   ABDOMINAL HYSTERECTOMY     COLONOSCOPY     remote past in [place]   COLONOSCOPY WITH PROPOFOL N/A 04/07/2020   non-bleeding internal hemorrhoids, multiple diverticula in entire colon, multiple polyps. Tubular adenomas. Surveillance in 5 years.    POLYPECTOMY  04/07/2020   Procedure: POLYPECTOMY;   Surgeon: [name], DO;  [facility];  Service: Endoscopy;;   ROBOTIC ASSITED PARTIAL NEPHRECTOMY Right 06/25/2020   Procedure: XI ROBOTIC ASSITED PARTIAL NEPHRECTOMY;  Surgeon: Cleon Gustin, MD;  [facility];  Service: Urology;  Laterality: Right;   TUBAL LIGATION       OB History   No obstetric history on file.     Family History  Problem Relation Age of Onset   Uterine cancer Sister    Colon cancer Neg Hx    Colon polyps Neg Hx     Social History   Tobacco Use   Smoking status: Former    Packs/day: 0.40    Types: Cigarettes    Quit date: 12/11/1999    Years since quitting: 20.9   Smokeless tobacco: Never  Vaping Use   Vaping Use: Never used  Substance Use Topics   Alcohol use: No   Drug use: No    Home Medications Prior to Admission medications   Medication Sig Start Date End Date Taking? Authorizing Provider  albuterol (PROVENTIL HFA;VENTOLIN HFA) 108 (90 BASE) MCG/ACT inhaler Inhale 2 puffs into the lungs every 4 (four) hours as needed. 07/28/14   Rolland Porter, MD  albuterol (PROVENTIL) (2.5 MG/3ML) 0.083% nebulizer solution Take 2.5 mg by nebulization 3 (three) times daily as needed for wheezing or shortness of breath. 03/24/20   [provider]  augmented betamethasone dipropionate (DIPROLENE-AF) 0.05 % cream Apply 1 application topically daily as needed (eczema). 04/16/20   [provider]  carboxymethylcellulose (REFRESH PLUS) 0.5 % SOLN Place 1 drop into both eyes 3 (three) times daily as needed (dry/irritated eyes.).    [provider]  diphenhydrAMINE (BENADRYL) 25 mg capsule Take 50 mg by mouth every 6 (six) hours as needed for itching (eczema).    [provider]  FARXIGA 10 MG TABS tablet Take 10 mg by mouth daily. 02/25/17   [provider]  HYDROcodone-acetaminophen (NORCO) 5-325 MG tablet Take 1-2 tablets by mouth every 6 (six) hours as needed for moderate pain. 07/01/20   McKenzie,  Candee Furbish, MD  losartan (COZAAR) 50 MG tablet Take 50 mg by mouth daily. 04/22/20   [provider]  metoprolol succinate (TOPROL-XL) 100 MG 24 hr tablet TAKE 1 TABLET(100 MG) BY MOUTH TWICE DAILY MUST MAKE APPT FOR MORE REFILLS Patient taking differently: Take 100 mg by mouth in the morning and at bedtime. TAKE 1 TABLET(100 MG) BY MOUTH TWICE DAILY MUST MAKE APPT FOR MORE REFILLS 04/16/20   Arnoldo Lenis, MD  NIFEdipine (PROCARDIA XL/NIFEDICAL-XL) 90 MG 24 hr tablet Take 90 mg by mouth daily. 02/21/20   [provider]  rosuvastatin (CRESTOR) 20 MG tablet Take 20 mg by mouth daily. 03/22/20   [provider]  XIIDRA 5 % SOLN Place 1 drop into both eyes daily as needed for dry eyes. 03/30/20   [provider]    Allergies    Ace inhibitors and Lisinopril  Review of Systems   Review of Systems  Musculoskeletal:  Positive for joint swelling.  All other systems reviewed and are negative.  Physical Exam Updated Vital Signs BP (!) 153/84 (BP Location: Right Arm)   Pulse 78   Temp 98 F (36.7 C)   Resp 18   Ht 5\' 4"  (1.626 m)   Wt 89.4 kg   SpO2 95%   BMI 33.81 kg/m   Physical Exam Vitals reviewed.  Cardiovascular:     Rate and Rhythm: Normal rate.  Pulmonary:     Effort: Pulmonary effort is normal.  Musculoskeletal:        General: Swelling and tenderness present.  Skin:    General: Skin is warm.  Neurological:     General: No focal deficit present.     Mental Status: She is alert.  Psychiatric:        Mood and Affect: Mood normal.    ED Results / Procedures / Treatments   Labs (all labs ordered are listed, but only abnormal results are displayed) Labs Reviewed - No data to display  EKG None  Radiology DG Ankle Complete Left  Result Date: 11/21/2020 CLINICAL DATA:  Twisted left ankle on sidewalk. Ankle pain and swelling. EXAM: LEFT ANKLE COMPLETE - 3+ VIEW COMPARISON:  None. FINDINGS: Nondisplaced transverse fracture is seen  involving the inferior aspect of the lateral malleolus, below the level of the tibial plafond. No other fractures are identified. No evidence of dislocation. IMPRESSION: Nondisplaced transverse fracture through the inferior lateral malleolus. Electronically Signed   By: Marlaine Hind M.D.   On: 11/21/2020 11:21    Procedures Procedures   Medications Ordered in ED Medications - No data to display  ED Course  I have reviewed the triage vital signs and the nursing notes.  Pertinent labs & imaging results that were available during my care of the patient were reviewed by me  and considered in my medical decision making (see chart for details).    MDM Rules/Calculators/A&P                           MDM:  Xray shows nondispaced fracture of inferior lateral malleolus.  Pt placed in a walker boot and given cruthes.  Pt advised to follow up with Orthopaedist for evatuation  Final Clinical Impression(s) / ED Diagnoses Final diagnoses:  Closed nondisplaced fracture of lateral malleolus of left fibula, initial encounter    Rx / DC Orders ED Discharge Orders     None     An After Visit Summary was printed and given to the patient.    Fransico Meadow, PA-C 11/21/20 1600    [place], [place], MD 11/21/20 [phone]

## 2020-11-21 NOTE — Discharge Instructions (Addendum)
Return if any problems.  Schedule to see the Orthopaedist for evatuation. Tylenol for apin

## 2020-11-21 NOTE — ED Triage Notes (Signed)
Pt to er room number 8, pt states that Wednesday she twisted her ankle on the side walk at work pt has brace in place, pt c/o L ankle pain.  Pt states that it is worse when she stands

## 2020-12-22 ENCOUNTER — Encounter: Payer: Self-pay | Admitting: Nurse Practitioner

## 2020-12-22 ENCOUNTER — Ambulatory Visit (INDEPENDENT_AMBULATORY_CARE_PROVIDER_SITE_OTHER): Payer: Medicare Other | Admitting: Nurse Practitioner

## 2020-12-22 ENCOUNTER — Other Ambulatory Visit: Payer: Self-pay

## 2020-12-22 ENCOUNTER — Telehealth: Payer: Self-pay

## 2020-12-22 ENCOUNTER — Other Ambulatory Visit: Payer: Self-pay | Admitting: Nurse Practitioner

## 2020-12-22 VITALS — BP 154/81 | HR 75 | Temp 98.0°F | Ht 64.0 in | Wt 200.0 lb

## 2020-12-22 DIAGNOSIS — S82892A Other fracture of left lower leg, initial encounter for closed fracture: Secondary | ICD-10-CM | POA: Insufficient documentation

## 2020-12-22 DIAGNOSIS — I10 Essential (primary) hypertension: Secondary | ICD-10-CM

## 2020-12-22 DIAGNOSIS — S82892D Other fracture of left lower leg, subsequent encounter for closed fracture with routine healing: Secondary | ICD-10-CM

## 2020-12-22 DIAGNOSIS — R002 Palpitations: Secondary | ICD-10-CM

## 2020-12-22 DIAGNOSIS — E785 Hyperlipidemia, unspecified: Secondary | ICD-10-CM | POA: Diagnosis not present

## 2020-12-22 DIAGNOSIS — C641 Malignant neoplasm of right kidney, except renal pelvis: Secondary | ICD-10-CM

## 2020-12-22 DIAGNOSIS — E1165 Type 2 diabetes mellitus with hyperglycemia: Secondary | ICD-10-CM

## 2020-12-22 DIAGNOSIS — Z139 Encounter for screening, unspecified: Secondary | ICD-10-CM

## 2020-12-22 MED ORDER — METOPROLOL SUCCINATE ER 100 MG PO TB24: 100.0000 mg | ORAL_TABLET | Freq: Two times a day (BID) | ORAL | 3 refills | Status: DC

## 2020-12-22 MED ORDER — ALBUTEROL SULFATE HFA 108 (90 BASE) MCG/ACT IN AERS: 2.0000 | INHALATION_SPRAY | RESPIRATORY_TRACT | 0 refills | Status: DC | PRN

## 2020-12-22 MED ORDER — FARXIGA 10 MG PO TABS: 10.0000 mg | ORAL_TABLET | Freq: Every day | ORAL | 3 refills | Status: DC

## 2020-12-22 MED ORDER — NIFEDIPINE ER OSMOTIC RELEASE 90 MG PO TB24: 90.0000 mg | ORAL_TABLET | Freq: Every day | ORAL | 3 refills | Status: DC

## 2020-12-22 MED ORDER — HYDROCHLOROTHIAZIDE 12.5 MG PO TABS: 12.5000 mg | ORAL_TABLET | Freq: Every day | ORAL | 3 refills | Status: DC

## 2020-12-22 MED ORDER — ROSUVASTATIN CALCIUM 20 MG PO TABS: 20.0000 mg | ORAL_TABLET | Freq: Every day | ORAL | 3 refills | Status: DC

## 2020-12-22 MED ORDER — ROSUVASTATIN CALCIUM 40 MG PO TABS: 40.0000 mg | ORAL_TABLET | Freq: Every day | ORAL | 3 refills | Status: DC

## 2020-12-22 MED ORDER — LOSARTAN POTASSIUM 50 MG PO TABS: 50.0000 mg | ORAL_TABLET | Freq: Every day | ORAL | 3 refills | Status: DC

## 2020-12-22 NOTE — Assessment & Plan Note (Signed)
-  ankle in brace today -followed by Dr. Lucia Gaskins with [facility]

## 2020-12-22 NOTE — Patient Instructions (Signed)
Please have fasting labs drawn 2-3 days prior to your appointment so we can discuss the results during your office visit.  

## 2020-12-22 NOTE — Telephone Encounter (Signed)
Sent it

## 2020-12-22 NOTE — Assessment & Plan Note (Addendum)
BP Readings from Last 3 Encounters:  12/22/20 (!) 154/81  11/21/20 (!) 153/84  10/02/20 129/75   -she states she gets leg swelling when she is up walking for extended periods -Rx. HCTZ -recheck in 1 month -dose of metoprolol seems high, but she has been taking this for some time; HR and BP stable today; request records from [facility]

## 2020-12-22 NOTE — Assessment & Plan Note (Addendum)
-  pt states she had surgery June 28, 2020; no further treatment needed -followed by Dr. Alyson Ingles

## 2020-12-22 NOTE — Assessment & Plan Note (Signed)
-  takes high dose metoprolol from Dr. Denita Lung -may consider future cardiology consult if she has any issues

## 2020-12-22 NOTE — Telephone Encounter (Signed)
Pt called voicemail and said she actually take Rosuvastatin 40mg -not 20mg  and wanted this new Rx sent in to the pharmacy. Please advise.

## 2020-12-22 NOTE — Progress Notes (Signed)
New Patient Office Visit  Subjective:  Patient ID: Lindsey Buckley, female    DOB: [birth year]  Age: 70 y.o. MRN: [id number]  CC:  Chief Complaint  Patient presents with   New Patient (Initial Visit)    Here to establish care. No complaints today, just needs refills.     HPI Lindsey Buckley presents for new patient visit. Transferring care from Dr. Cindie Laroche x14 years. Last physical was earlier this year, around March. Last labs with PCP were drawn over 3 months ago.  She is followed by ortho Lucia Gaskins with [facility]) for left ankle fracture.  Needs refills on current meds.    Past Medical History:  Diagnosis Date   Asthma 2007   smoke trigged   Chest pain    history of   Diabetes mellitus without complication ([place]) 2111   borderline diabetes per pt per MD    Dysrhythmia 2003   Eczema    GERD (gastroesophageal reflux disease)    Hyperlipidemia    Other and unspecified hyperlipidemia    Pulmonary congestion and hypostasis 2003   Pulmonary edema 2003   Unspecified essential hypertension     Past Surgical History:  Procedure Laterality Date   ABDOMINAL HYSTERECTOMY     partial   COLONOSCOPY     remote past in [place]   COLONOSCOPY WITH PROPOFOL N/A 04/07/2020   non-bleeding internal hemorrhoids, multiple diverticula in entire colon, multiple polyps. Tubular adenomas. Surveillance in 5 years.    POLYPECTOMY  04/07/2020   Procedure: POLYPECTOMY;  Surgeon: [name], DO;  [facility];  Service: Endoscopy;;   ROBOTIC ASSITED PARTIAL NEPHRECTOMY Right 06/25/2020   Procedure: XI ROBOTIC ASSITED PARTIAL NEPHRECTOMY;  Surgeon: Cleon Gustin, MD;  Location[facility];  Service: Urology;  Laterality: Right;   TUBAL LIGATION      Family History  Problem Relation Age of Onset   Uterine cancer Sister    Colon cancer Neg Hx    Colon polyps Neg Hx     Social History   Socioeconomic History   Marital status: Single    Spouse name:  Not on file   Number of children: 3   Years of education: Not on file   Highest education level: Not on file  Occupational History   Occupation: Full time   Occupation: CNA-part time; 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  Tobacco Use   Smoking status: Former    Packs/day: 0.40    Types: Cigarettes    Quit date: 12/11/1999    Years since quitting: 21.0   Smokeless tobacco: Never  Vaping Use   Vaping Use: Never used  Substance and Sexual Activity   Alcohol use: No   Drug use: No   Sexual activity: Not Currently  Other Topics Concern   Not on file  Social History Narrative   Divorced,3 children,No regular exercise   1 son in [place], 2 children in [place] Determinants of Health   Financial Resource Strain: Not on file  Food Insecurity: Not on file  Transportation Needs: Not on file  Physical Activity: Not on file  Stress: Not on file  Social Connections: Not on file  Intimate Partner Violence: Not on file    ROS Review of Systems  Constitutional: Negative.   Respiratory: Negative.    Cardiovascular:  Positive for leg swelling.       When she is walking and/or on her feet for extended periods  Musculoskeletal:  Left ankle fracture; wearing brace today  Psychiatric/Behavioral: Negative.     Objective:   Today's Vitals: BP (!) 154/81 (BP Location: Left Arm, Patient Position: Sitting, Cuff Size: Large)   Pulse 75   Temp 98 F (36.7 C) (Oral)   Ht _0  (1.626 m)   Wt 200 lb (90.7 kg)   SpO2 96%   BMI 34.33 kg/m   Physical Exam Constitutional:      Appearance: Normal appearance. She is obese.  Cardiovascular:     Rate and Rhythm: Normal rate and regular rhythm.     Pulses: Normal pulses.     Heart sounds: Normal heart sounds.  Pulmonary:     Effort: Pulmonary effort is normal.     Breath sounds: Normal breath sounds.  Musculoskeletal:     Comments: Left ankle in brace today  Neurological:     Mental Status: She is alert.  Psychiatric:        Mood and Affect:  Mood normal.        Behavior: Behavior normal.        Thought Content: Thought content normal.        Judgment: Judgment normal.    Assessment & Plan:   Problem List Items Addressed This Visit       Cardiovascular and Mediastinum   Essential hypertension, benign    BP Readings from Last 3 Encounters:  12/22/20 (!) 154/81  11/21/20 (!) 153/84  10/02/20 129/75  -she states she gets leg swelling when she is up walking for extended periods -Rx. HCTZ -recheck in 1 month -dose of metoprolol seems high, but she has been taking this for some time; HR and BP stable today; [facility]      Relevant Medications   hydrochlorothiazide (HYDRODIURIL) 12.5 MG tablet   rosuvastatin (CRESTOR) 20 MG tablet   losartan (COZAAR) 50 MG tablet   NIFEdipine (PROCARDIA XL/NIFEDICAL-XL) 90 MG 24 hr tablet   metoprolol succinate (TOPROL-XL) 100 MG 24 hr tablet     Musculoskeletal and Integument   Closed left ankle fracture    -ankle in brace today -followed by Dr. Lucia Gaskins with [facility]        Genitourinary   Renal cell cancer, right [facility])    -pt states she had surgery June 28, 2020; no further treatment needed -followed by Dr. Alyson Ingles        Other   Palpitations    -takes high dose metoprolol from Dr. Denita Lung -may consider future cardiology consult if she has any issues      Other Visit Diagnoses     Uncontrolled type 2 diabetes mellitus with hyperglycemia ([place])    -  Primary   Relevant Medications   FARXIGA 10 MG TABS tablet   rosuvastatin (CRESTOR) 20 MG tablet   losartan (COZAAR) 50 MG tablet   Other Relevant Orders   CBC with Differential/Platelet   CMP14+EGFR   Lipid Panel With LDL/HDL Ratio   Hemoglobin A1c   Hyperlipidemia, unspecified hyperlipidemia type       Relevant Medications   hydrochlorothiazide (HYDRODIURIL) 12.5 MG tablet   rosuvastatin (CRESTOR) 20 MG tablet   losartan (COZAAR) 50 MG tablet   NIFEdipine (PROCARDIA XL/NIFEDICAL-XL)  90 MG 24 hr tablet   metoprolol succinate (TOPROL-XL) 100 MG 24 hr tablet   Other Relevant Orders   CBC with Differential/Platelet   CMP14+EGFR   Lipid Panel With LDL/HDL Ratio   Screening due       Relevant Orders   Hepatitis  C Antibody       Outpatient Encounter Medications as of 12/22/2020  Medication Sig   albuterol (PROVENTIL) (2.5 MG/3ML) 0.083% nebulizer solution Take 2.5 mg by nebulization 3 (three) times daily as needed for wheezing or shortness of breath.   carboxymethylcellulose (REFRESH PLUS) 0.5 % SOLN Place 1 drop into both eyes 3 (three) times daily as needed (dry/irritated eyes.).   diphenhydrAMINE (BENADRYL) 25 mg capsule Take 50 mg by mouth every 6 (six) hours as needed for itching (eczema).   hydrochlorothiazide (HYDRODIURIL) 12.5 MG tablet Take 1 tablet (12.5 mg total) by mouth daily.   omeprazole (PRILOSEC) 20 MG capsule Take 20 mg by mouth in the morning and at bedtime.   XIIDRA 5 % SOLN Place 1 drop into both eyes daily as needed for dry eyes.   [DISCONTINUED] albuterol (PROVENTIL HFA;VENTOLIN HFA) 108 (90 BASE) MCG/ACT inhaler Inhale 2 puffs into the lungs every 4 (four) hours as needed.   [DISCONTINUED] FARXIGA 10 MG TABS tablet Take 10 mg by mouth daily.   [DISCONTINUED] losartan (COZAAR) 50 MG tablet Take 50 mg by mouth daily.   [DISCONTINUED] metoprolol succinate (TOPROL-XL) 100 MG 24 hr tablet TAKE 1 TABLET(100 MG) BY MOUTH TWICE DAILY MUST MAKE APPT FOR MORE REFILLS (Patient taking differently: Take 100 mg by mouth in the morning and at bedtime. TAKE 1 TABLET(100 MG) BY MOUTH TWICE DAILY MUST MAKE APPT FOR MORE REFILLS)   [DISCONTINUED] NIFEdipine (PROCARDIA XL/NIFEDICAL-XL) 90 MG 24 hr tablet Take 90 mg by mouth daily.   [DISCONTINUED] rosuvastatin (CRESTOR) 20 MG tablet Take 20 mg by mouth daily.   albuterol (VENTOLIN HFA) 108 (90 Base) MCG/ACT inhaler Inhale 2 puffs into the lungs every 4 (four) hours as needed.   FARXIGA 10 MG TABS tablet Take 1 tablet (10  mg total) by mouth daily.   losartan (COZAAR) 50 MG tablet Take 1 tablet (50 mg total) by mouth daily.   metoprolol succinate (TOPROL-XL) 100 MG 24 hr tablet Take 1 tablet (100 mg total) by mouth in the morning and at bedtime. TAKE 1 TABLET(100 MG) BY MOUTH TWICE DAILY   NIFEdipine (PROCARDIA XL/NIFEDICAL-XL) 90 MG 24 hr tablet Take 1 tablet (90 mg total) by mouth daily.   rosuvastatin (CRESTOR) 20 MG tablet Take 1 tablet (20 mg total) by mouth daily.   [DISCONTINUED] augmented betamethasone dipropionate (DIPROLENE-AF) 0.05 % cream Apply 1 application topically daily as needed (eczema). (Patient not taking: Reported on 12/22/2020)   [DISCONTINUED] HYDROcodone-acetaminophen (NORCO) 5-325 MG tablet Take 1-2 tablets by mouth every 6 (six) hours as needed for moderate pain. (Patient not taking: Reported on 12/22/2020)   No facility-administered encounter medications on file as of 12/22/2020.    Follow-up: Return in about 1 month (around 01/21/2021) for Lab follow-up (DM, HTN, HLD).   Noreene Larsson, NP

## 2021-01-19 ENCOUNTER — Telehealth: Payer: Self-pay | Admitting: Nurse Practitioner

## 2021-01-19 ENCOUNTER — Ambulatory Visit (INDEPENDENT_AMBULATORY_CARE_PROVIDER_SITE_OTHER): Payer: Medicare Other | Admitting: Family Medicine

## 2021-01-19 ENCOUNTER — Other Ambulatory Visit: Payer: Self-pay

## 2021-01-19 DIAGNOSIS — R062 Wheezing: Secondary | ICD-10-CM

## 2021-01-19 MED ORDER — PREDNISONE 20 MG PO TABS: ORAL_TABLET | ORAL | 0 refills | Status: DC

## 2021-01-19 MED ORDER — MONTELUKAST SODIUM 10 MG PO TABS: 10.0000 mg | ORAL_TABLET | Freq: Every day | ORAL | 3 refills | Status: DC

## 2021-01-19 NOTE — Progress Notes (Signed)
Virtual Visit via Telephone Note  I connected with Lindsey Buckley on 01/19/21 at  3:20 PM EDT by telephone and verified that I am speaking with the correct person using two identifiers.  Location: Patient: home Provider: office   I discussed the limitations, risks, security and privacy concerns of performing an evaluation and management service by telephone and the availability of in person appointments. I also discussed with the patient that there may be a patient responsible charge related to this service. The patient expressed understanding and agreed to proceed.   History of Present Illness: 5 day h/o wheezing and dry cough for past 2 weeks Usually has asthma flare this time of the year and responds to high dose steroids Increased post nasal drainage , denies fever or chills   Observations/Objective: Wt 197 lb (89.4 kg)   BMI 33.81 kg/m  Good communication with no confusion and intact memory. Alert and oriented x 3 Excessive cough during visit Assessment and Plan:  Wheezing Start singulair, adnshort course of prednisone is prescribed,  Follow Up Instructions:    I discussed the assessment and treatment plan with the patient. The patient was provided an opportunity to ask questions and all were answered. The patient agreed with the plan and demonstrated an understanding of the instructions.   The patient was advised to call back or seek an in-person evaluation if the symptoms worsen or if the condition fails to improve as anticipated.  I provided 9 minutes of non-face-to-face time during this encounter.   Tula Nakayama, MD

## 2021-01-19 NOTE — Telephone Encounter (Signed)
Pt states she has a drip in her throat and wheezing in chest due to asthma

## 2021-01-19 NOTE — Telephone Encounter (Signed)
Pt is requesting Prednisone to be called in

## 2021-01-19 NOTE — Telephone Encounter (Signed)
Pt is scheduled for a work in today

## 2021-01-19 NOTE — Patient Instructions (Signed)
F/U in December with Lindsey Buckley as before  Montelukast and prednisone are prescribed for your asthma   Please reconsider the flu vaccine  Thanks for choosing [facility], we consider it a privelige to serve you.

## 2021-01-25 ENCOUNTER — Encounter: Payer: Self-pay | Admitting: Family Medicine

## 2021-01-25 NOTE — Assessment & Plan Note (Signed)
Start singulair, adnshort course of prednisone is prescribed,

## 2021-01-28 ENCOUNTER — Other Ambulatory Visit: Payer: Self-pay

## 2021-01-28 ENCOUNTER — Ambulatory Visit (INDEPENDENT_AMBULATORY_CARE_PROVIDER_SITE_OTHER): Payer: Medicare Other | Admitting: *Deleted

## 2021-01-28 DIAGNOSIS — Z780 Asymptomatic menopausal state: Secondary | ICD-10-CM | POA: Diagnosis not present

## 2021-01-28 DIAGNOSIS — Z1231 Encounter for screening mammogram for malignant neoplasm of breast: Secondary | ICD-10-CM | POA: Diagnosis not present

## 2021-01-28 DIAGNOSIS — Z0000 Encounter for general adult medical examination without abnormal findings: Secondary | ICD-10-CM | POA: Diagnosis not present

## 2021-01-28 NOTE — Progress Notes (Signed)
Subjective:   Lindsey Buckley is a 70 y.o. female who presents for Medicare Annual (Subsequent) preventive examination.  I connected with  Lindsey Buckley on 01/28/21 by an audio enabled telemedicine application and verified that I am speaking with the correct person using two identifiers.   I discussed the limitations, risks, security and privacy concerns of performing an evaluation and management service by telephone and the availability of in person appointments. I also discussed with the patient that there may be a patient responsible charge related to this service. The patient expressed understanding and verbally consented to this telephonic visit.   Review of Systems           Objective:    There were no vitals filed for this visit. There is no height or weight on file to calculate BMI.  Advanced Directives 11/21/2020 06/25/2020 06/15/2020 04/21/2020 04/02/2020 02/17/2020 01/18/2020  Does Patient Have a Medical Advance Directive? No No No No No No No  Would patient like information on creating a medical advance directive? - No - Patient declined - No - Patient declined No - Patient declined - -    Current Medications (verified) Outpatient Encounter Medications as of 01/28/2021  Medication Sig   albuterol (PROVENTIL) (2.5 MG/3ML) 0.083% nebulizer solution Take 2.5 mg by nebulization 3 (three) times daily as needed for wheezing or shortness of breath.   albuterol (VENTOLIN HFA) 108 (90 Base) MCG/ACT inhaler Inhale 2 puffs into the lungs every 4 (four) hours as needed.   carboxymethylcellulose (REFRESH PLUS) 0.5 % SOLN Place 1 drop into both eyes 3 (three) times daily as needed (dry/irritated eyes.).   diphenhydrAMINE (BENADRYL) 25 mg capsule Take 50 mg by mouth every 6 (six) hours as needed for itching (eczema).   FARXIGA 10 MG TABS tablet Take 1 tablet (10 mg total) by mouth daily.   hydrochlorothiazide (HYDRODIURIL) 12.5 MG tablet Take 1 tablet (12.5 mg total) by mouth  daily. (Patient not taking: Reported on 01/19/2021)   losartan (COZAAR) 50 MG tablet Take 1 tablet (50 mg total) by mouth daily.   metoprolol succinate (TOPROL-XL) 100 MG 24 hr tablet Take 1 tablet (100 mg total) by mouth in the morning and at bedtime. TAKE 1 TABLET(100 MG) BY MOUTH TWICE DAILY   montelukast (SINGULAIR) 10 MG tablet Take 1 tablet (10 mg total) by mouth at bedtime.   NIFEdipine (PROCARDIA XL/NIFEDICAL-XL) 90 MG 24 hr tablet Take 1 tablet (90 mg total) by mouth daily.   omeprazole (PRILOSEC) 20 MG capsule Take 20 mg by mouth in the morning and at bedtime.   predniSONE (DELTASONE) 20 MG tablet Take 3 tablets daily for 3  days, then take two tablets daily for 3 days, then take one tablet once daily for 3 days   rosuvastatin (CRESTOR) 40 MG tablet Take 1 tablet (40 mg total) by mouth daily.   XIIDRA 5 % SOLN Place 1 drop into both eyes daily as needed for dry eyes.   No facility-administered encounter medications on file as of 01/28/2021.    Allergies (verified) Ace inhibitors and Lisinopril   History: Past Medical History:  Diagnosis Date   Asthma 2007   smoke trigged   Chest pain    history of   Diabetes mellitus without complication ([place]) 7741   borderline diabetes per pt per MD    Dysrhythmia 2003   Eczema    GERD (gastroesophageal reflux disease)    Hyperlipidemia    Other and unspecified hyperlipidemia    Pulmonary  congestion and hypostasis 2003   Pulmonary edema 2003   Unspecified essential hypertension    Past Surgical History:  Procedure Laterality Date   ABDOMINAL HYSTERECTOMY     partial   COLONOSCOPY     remote past in [place]   COLONOSCOPY WITH PROPOFOL N/A 04/07/2020   non-bleeding internal hemorrhoids, multiple diverticula in entire colon, multiple polyps. Tubular adenomas. Surveillance in 5 years.    POLYPECTOMY  04/07/2020   Procedure: POLYPECTOMY;  Surgeon: [name], DO;  Location: [facility];  Service: Endoscopy;;   ROBOTIC  ASSITED PARTIAL NEPHRECTOMY Right 06/25/2020   Procedure: XI ROBOTIC ASSITED PARTIAL NEPHRECTOMY;  Surgeon: Cleon Gustin, MD;  Location: [facility];  Service: Urology;  Laterality: Right;   TUBAL LIGATION     Family History  Problem Relation Age of Onset   Uterine cancer Sister    Colon cancer Neg Hx    Colon polyps Neg Hx    Social History   Socioeconomic History   Marital status: Single    Spouse name: Not on file   Number of children: 3   Years of education: Not on file   Highest education level: Not on file  Occupational History   Occupation: Full time   Occupation: CNA-part time; 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  Tobacco Use   Smoking status: Former    Packs/day: 0.40    Types: Cigarettes    Quit date: 12/11/1999    Years since quitting: 21.1   Smokeless tobacco: Never  Vaping Use   Vaping Use: Never used  Substance and Sexual Activity   Alcohol use: No   Drug use: No   Sexual activity: Not Currently  Other Topics Concern   Not on file  Social History Narrative   Divorced,3 children,No regular exercise   1 son in [place], 2 children in [place] Determinants of Health   Financial Resource Strain: Not on file  Food Insecurity: Not on file  Transportation Needs: Not on file  Physical Activity: Not on file  Stress: Not on file  Social Connections: Not on file    Tobacco Counseling Counseling given: Not Answered   Clinical Intake:                 Diabetic?no         Activities of Daily Living In your present state of health, do you have any difficulty performing the following activities: 06/25/2020 06/25/2020  Hearing? - N  Vision? - N  Difficulty concentrating or making decisions? - N  Walking or climbing stairs? - N  Dressing or bathing? - N  Doing errands, shopping? N -  Some recent data might be hidden    Patient Care Team: Noreene Larsson, NP as PCP - General (Nurse Practitioner) Arnoldo Lenis, MD as Consulting Physician  (Cardiology)  Indicate any recent Medical Services you may have received from other than [facility] providers in the past year (date may be approximate).     Assessment:   This is a routine wellness examination for [facility].  Hearing/Vision screen No results found.  Dietary issues and exercise activities discussed:     Goals Addressed   None   Depression Screen PHQ 2/9 Scores 01/19/2021 12/22/2020  PHQ - 2 Score 0 0  PHQ- 9 Score 0 -    Fall Risk Fall Risk  01/19/2021 12/22/2020  Falls in the past year? 1 1  Number falls in past yr: 0 0  Injury with Fall? 1 1  Risk for fall due to : - Impaired balance/gait  Follow up - Falls evaluation completed    FALL RISK PREVENTION PERTAINING TO THE HOME:  Any stairs in or around the home? Yes  If so, are there any without handrails? Yes  Home free of loose throw rugs in walkways, pet beds, electrical cords, etc? Yes  Adequate lighting in your home to reduce risk of falls? Yes   ASSISTIVE DEVICES UTILIZED TO PREVENT FALLS:  Life alert? No  Use of a cane, walker or w/c? No  Grab bars in the bathroom? Yes  Shower chair or bench in shower? No  Elevated toilet seat or a handicapped toilet? Yes   TIMED UP AND GO:  Was the test performed? No .  Length of time to ambulate 10 feet: NA sec.   Cognitive Function:        Immunizations Immunization History  Administered Date(s) Administered   Moderna SARS-COV2 Booster Vaccination 08/07/2019, 09/25/2019    TDAP status: Due, Education has been provided regarding the importance of this vaccine. Advised may receive this vaccine at local pharmacy or Health Dept. Aware to provide a copy of the vaccination record if obtained from local pharmacy or Health Dept. Verbalized acceptance and understanding.  Flu Vaccine status: Declined, Education has been provided regarding the importance of this vaccine but patient still declined. Advised may receive this vaccine at local pharmacy or Health Dept.  Aware to provide a copy of the vaccination record if obtained from local pharmacy or Health Dept. Verbalized acceptance and understanding.  Pneumococcal vaccine status: Declined,  Education has been provided regarding the importance of this vaccine but patient still declined. Advised may receive this vaccine at local pharmacy or Health Dept. Aware to provide a copy of the vaccination record if obtained from local pharmacy or Health Dept. Verbalized acceptance and understanding.   Covid-19 vaccine status: Completed vaccines  Qualifies for Shingles Vaccine? Yes   Zostavax completed Yes   Shingrix Completed?: Yes  Screening Tests Health Maintenance  Topic Date Due   COVID-19 Vaccine (1) 10/20/1950   Pneumonia Vaccine 56+ Years old (1 - PCV) Never done   Hepatitis C Screening  Never done   Zoster Vaccines- Shingrix (1 of 2) Never done   DEXA SCAN  Never done   INFLUENZA VACCINE  07/02/2021 (Originally 11/02/2020)   TETANUS/TDAP  12/22/2021 (Originally 04/21/1970)   MAMMOGRAM  07/23/2021   COLONOSCOPY (Pts 45-38yrs Insurance coverage will need to be confirmed)  04/07/2030   HPV VACCINES  Aged Out    Health Maintenance  Health Maintenance Due  Topic Date Due   COVID-19 Vaccine (1) 10/20/1950   Pneumonia Vaccine 41+ Years old (1 - PCV) Never done   Hepatitis C Screening  Never done   Zoster Vaccines- Shingrix (1 of 2) Never done   DEXA SCAN  Never done    Colorectal cancer screening: Type of screening: Colonoscopy. Completed 04-07-20. Repeat every 10 years  Mammogram status: Ordered 01-28-21. Pt provided with contact info and advised to call to schedule appt.   Bone Density status: Ordered 01-28-21. Pt provided with contact info and advised to call to schedule appt.  Lung Cancer Screening: (Low Dose CT Chest recommended if Age 37-80 years, 30 pack-year currently smoking OR have quit w/in 15years.) does not qualify.   Lung Cancer Screening Referral: NA  Additional  Screening:  Hepatitis C Screening: does qualify; Completed   Vision Screening: Recommended annual ophthalmology exams for early detection of glaucoma and other disorders  of the eye. Is the patient up to date with their annual eye exam?  Yes  Who is the provider or what is the name of the office in which the patient attends annual eye exams? [facility] If pt is not established with a provider, would they like to be referred to a provider to establish care? No .   Dental Screening: Recommended annual dental exams for proper oral hygiene  Community Resource Referral / Chronic Care Management: CRR required this visit?  No   CCM required this visit?  No      Plan:     I have personally reviewed and noted the following in the patient's chart:   Medical and social history Use of alcohol, tobacco or illicit drugs  Current medications and supplements including opioid prescriptions.  Functional ability and status Nutritional status Physical activity Advanced directives List of other physicians Hospitalizations, surgeries, and ER visits in previous 12 months Vitals Screenings to include cognitive, depression, and falls Referrals and appointments  In addition, I have reviewed and discussed with patient certain preventive protocols, quality metrics, and best practice recommendations. A written personalized care plan for preventive services as well as general preventive health recommendations were provided to patient.     Shelda Altes, CMA   01/28/2021   Nurse Notes: This was a telehealth visit. The patient was at home. The provider was Ihor Dow, MD and was in office.

## 2021-01-28 NOTE — Patient Instructions (Signed)
[name] , Thank you for taking time to come for your Medicare Wellness Visit. I appreciate your ongoing commitment to your health goals. Please review the following plan we discussed and let me know if I can assist you in the future.   Screening recommendations/referrals: Colonoscopy: Due 04-07-2030 Mammogram: ordered Bone Density: ordered Recommended yearly ophthalmology/optometry visit for glaucoma screening and checkup Recommended yearly dental visit for hygiene and checkup  Vaccinations: Influenza vaccine: Declined Pneumococcal vaccine: Declined Tdap vaccine: Declined Shingles vaccine: Declined    Advanced directives: declined information  Conditions/risks identified: hypertension  Next appointment: 1 year    Preventive Care 69 Years and Older, Female Preventive care refers to lifestyle choices and visits with your health care provider that can promote health and wellness. What does preventive care include? A yearly physical exam. This is also called an annual well check. Dental exams once or twice a year. Routine eye exams. Ask your health care provider how often you should have your eyes checked. Personal lifestyle choices, including: Daily care of your teeth and gums. Regular physical activity. Eating a healthy diet. Avoiding tobacco and drug use. Limiting alcohol use. Practicing safe sex. Taking low-dose aspirin every day. Taking vitamin and mineral supplements as recommended by your health care provider. What happens during an annual well check? The services and screenings done by your health care provider during your annual well check will depend on your age, overall health, lifestyle risk factors, and family history of disease. Counseling  Your health care provider may ask you questions about your: Alcohol use. Tobacco use. Drug use. Emotional well-being. Home and relationship well-being. Sexual activity. Eating habits. History of falls. Memory and  ability to understand (cognition). Work and work Statistician. Reproductive health. Screening  You may have the following tests or measurements: Height, weight, and BMI. Blood pressure. Lipid and cholesterol levels. These may be checked every 5 years, or more frequently if you are over 18 years old. Skin check. Lung cancer screening. You may have this screening every year starting at age 49 if you have a 30-pack-year history of smoking and currently smoke or have quit within the past 15 years. Fecal occult blood test (FOBT) of the stool. You may have this test every year starting at age 50. Flexible sigmoidoscopy or colonoscopy. You may have a sigmoidoscopy every 5 years or a colonoscopy every 10 years starting at age 69. Hepatitis C blood test. Hepatitis B blood test. Sexually transmitted disease (STD) testing. Diabetes screening. This is done by checking your blood sugar (glucose) after you have not eaten for a while (fasting). You may have this done every 1-3 years. Bone density scan. This is done to screen for osteoporosis. You may have this done starting at age 69. Mammogram. This may be done every 1-2 years. Talk to your health care provider about how often you should have regular mammograms. Talk with your health care provider about your test results, treatment options, and if necessary, the need for more tests. Vaccines  Your health care provider may recommend certain vaccines, such as: Influenza vaccine. This is recommended every year. Tetanus, diphtheria, and acellular pertussis (Tdap, Td) vaccine. You may need a Td booster every 10 years. Zoster vaccine. You may need this after age 54. Pneumococcal 13-valent conjugate (PCV13) vaccine. One dose is recommended after age 26. Pneumococcal polysaccharide (PPSV23) vaccine. One dose is recommended after age 7. Talk to your health care provider about which screenings and vaccines you need and how often you need them.  This information is  not intended to replace advice given to you by your health care provider. Make sure you discuss any questions you have with your health care provider. Document Released: 04/17/2015 Document Revised: 12/09/2015 Document Reviewed: 01/20/2015 Elsevier Interactive Patient Education  2017 [employer] Prevention in the Home Falls can cause injuries. They can happen to people of all ages. There are many things you can do to make your home safe and to help prevent falls. What can I do on the outside of my home? Regularly fix the edges of walkways and driveways and fix any cracks. Remove anything that might make you trip as you walk through a door, such as a raised step or threshold. Trim any bushes or trees on the path to your home. Use bright outdoor lighting. Clear any walking paths of anything that might make someone trip, such as rocks or tools. Regularly check to see if handrails are loose or broken. Make sure that both sides of any steps have handrails. Any raised decks and porches should have guardrails on the edges. Have any leaves, snow, or ice cleared regularly. Use sand or salt on walking paths during winter. Clean up any spills in your garage right away. This includes oil or grease spills. What can I do in the bathroom? Use night lights. Install grab bars by the toilet and in the tub and shower. Do not use towel bars as grab bars. Use non-skid mats or decals in the tub or shower. If you need to sit down in the shower, use a plastic, non-slip stool. Keep the floor dry. Clean up any water that spills on the floor as soon as it happens. Remove soap buildup in the tub or shower regularly. Attach bath mats securely with double-sided non-slip rug tape. Do not have throw rugs and other things on the floor that can make you trip. What can I do in the bedroom? Use night lights. Make sure that you have a light by your bed that is easy to reach. Do not use any sheets or blankets that  are too big for your bed. They should not hang down onto the floor. Have a firm chair that has side arms. You can use this for support while you get dressed. Do not have throw rugs and other things on the floor that can make you trip. What can I do in the kitchen? Clean up any spills right away. Avoid walking on wet floors. Keep items that you use a lot in easy-to-reach places. If you need to reach something above you, use a strong step stool that has a grab bar. Keep electrical cords out of the way. Do not use floor polish or wax that makes floors slippery. If you must use wax, use non-skid floor wax. Do not have throw rugs and other things on the floor that can make you trip. What can I do with my stairs? Do not leave any items on the stairs. Make sure that there are handrails on both sides of the stairs and use them. Fix handrails that are broken or loose. Make sure that handrails are as long as the stairways. Check any carpeting to make sure that it is firmly attached to the stairs. Fix any carpet that is loose or worn. Avoid having throw rugs at the top or bottom of the stairs. If you do have throw rugs, attach them to the floor with carpet tape. Make sure that you have a light switch at the  top of the stairs and the bottom of the stairs. If you do not have them, ask someone to add them for you. What else can I do to help prevent falls? Wear shoes that: Do not have high heels. Have rubber bottoms. Are comfortable and fit you well. Are closed at the toe. Do not wear sandals. If you use a stepladder: Make sure that it is fully opened. Do not climb a closed stepladder. Make sure that both sides of the stepladder are locked into place. Ask someone to hold it for you, if possible. Clearly mark and make sure that you can see: Any grab bars or handrails. First and last steps. Where the edge of each step is. Use tools that help you move around (mobility aids) if they are needed. These  include: Canes. Walkers. Scooters. Crutches. Turn on the lights when you go into a dark area. Replace any light bulbs as soon as they burn out. Set up your furniture so you have a clear path. Avoid moving your furniture around. If any of your floors are uneven, fix them. If there are any pets around you, be aware of where they are. Review your medicines with your doctor. Some medicines can make you feel dizzy. This can increase your chance of falling. Ask your doctor what other things that you can do to help prevent falls. This information is not intended to replace advice given to you by your health care provider. Make sure you discuss any questions you have with your health care provider. Document Released: 01/15/2009 Document Revised: 08/27/2015 Document Reviewed: 04/25/2014 Elsevier Interactive Patient Education  2017 [employer].

## 2021-02-17 ENCOUNTER — Ambulatory Visit: Payer: Medicare Other | Admitting: Nurse Practitioner

## 2021-03-01 ENCOUNTER — Ambulatory Visit (HOSPITAL_COMMUNITY)
Admission: RE | Admit: 2021-03-01 | Discharge: 2021-03-01 | Disposition: A | Discharge status: Home / Self Care | Payer: Medicare Other | Source: Ambulatory Visit | Attending: Internal Medicine | Admitting: Internal Medicine

## 2021-03-01 ENCOUNTER — Other Ambulatory Visit: Payer: Self-pay

## 2021-03-01 DIAGNOSIS — Z780 Asymptomatic menopausal state: Secondary | ICD-10-CM | POA: Insufficient documentation

## 2021-03-01 DIAGNOSIS — Z1231 Encounter for screening mammogram for malignant neoplasm of breast: Secondary | ICD-10-CM | POA: Diagnosis not present

## 2021-03-02 LAB — LIPID PANEL WITH LDL/HDL RATIO
Cholesterol, Total: 196 mg/dL (ref 100–199)
HDL: 63 mg/dL (ref >39)
LDL Chol Calc (NIH): 107 mg/dL — ABNORMAL HIGH (ref 0–99)
LDL/HDL Ratio: 1.7 ratio (ref 0.0–3.2)
Triglycerides: 153 mg/dL — ABNORMAL HIGH (ref 0–149)
VLDL Cholesterol Cal: 26 mg/dL (ref 5–40)

## 2021-03-02 LAB — CBC WITH DIFFERENTIAL/PLATELET
Basophils Absolute: 0.1 10*3/uL (ref 0.0–0.2)
Basos: 1 %
EOS (ABSOLUTE): 0.4 10*3/uL (ref 0.0–0.4)
Eos: 4 %
Hematocrit: 42.7 % (ref 34.0–46.6)
Hemoglobin: 14 g/dL (ref 11.1–15.9)
Immature Grans (Abs): 0 10*3/uL (ref 0.0–0.1)
Immature Granulocytes: 0 %
Lymphocytes Absolute: 2 10*3/uL (ref 0.7–3.1)
Lymphs: 21 %
MCH: 29.2 pg (ref 26.6–33.0)
MCHC: 32.8 g/dL (ref 31.5–35.7)
MCV: 89 fL (ref 79–97)
Monocytes Absolute: 1 10*3/uL — ABNORMAL HIGH (ref 0.1–0.9)
Monocytes: 10 %
Neutrophils Absolute: 6.1 10*3/uL (ref 1.4–7.0)
Neutrophils: 64 %
Platelets: 347 10*3/uL (ref 150–450)
RBC: 4.8 x10E6/uL (ref 3.77–5.28)
RDW: 12.7 % (ref 11.7–15.4)
WBC: 9.5 10*3/uL (ref 3.4–10.8)

## 2021-03-02 LAB — HEPATITIS C ANTIBODY: Hep C Virus Ab: 0.2 s/co ratio (ref 0.0–0.9)

## 2021-03-02 LAB — CMP14+EGFR
ALT: 22 IU/L (ref 0–32)
AST: 25 IU/L (ref 0–40)
Albumin/Globulin Ratio: 1.7 (ref 1.2–2.2)
Albumin: 4.9 g/dL — ABNORMAL HIGH (ref 3.8–4.8)
Alkaline Phosphatase: 76 IU/L (ref 44–121)
BUN/Creatinine Ratio: 13 (ref 12–28)
BUN: 15 mg/dL (ref 8–27)
Bilirubin Total: 0.6 mg/dL (ref 0.0–1.2)
CO2: 25 mmol/L (ref 20–29)
Calcium: 10.1 mg/dL (ref 8.7–10.3)
Chloride: 101 mmol/L (ref 96–106)
Creatinine, Ser: 1.17 mg/dL — ABNORMAL HIGH (ref 0.57–1.00)
Globulin, Total: 2.9 g/dL (ref 1.5–4.5)
Glucose: 97 mg/dL (ref 70–99)
Potassium: 5 mmol/L (ref 3.5–5.2)
Sodium: 141 mmol/L (ref 134–144)
Total Protein: 7.8 g/dL (ref 6.0–8.5)
eGFR: 51 mL/min/{1.73_m2} — ABNORMAL LOW (ref >59)

## 2021-03-02 LAB — HEMOGLOBIN A1C
Est. average glucose Bld gHb Est-mCnc: 131 mg/dL
Hgb A1c MFr Bld: 6.2 % — ABNORMAL HIGH (ref 4.8–5.6)

## 2021-03-05 ENCOUNTER — Ambulatory Visit (INDEPENDENT_AMBULATORY_CARE_PROVIDER_SITE_OTHER): Payer: Medicare Other | Admitting: Nurse Practitioner

## 2021-03-05 ENCOUNTER — Other Ambulatory Visit: Payer: Self-pay

## 2021-03-05 ENCOUNTER — Encounter: Payer: Self-pay | Admitting: Nurse Practitioner

## 2021-03-05 ENCOUNTER — Other Ambulatory Visit: Payer: Self-pay | Admitting: *Deleted

## 2021-03-05 VITALS — BP 174/92 | HR 75 | Ht 64.0 in | Wt 198.0 lb

## 2021-03-05 DIAGNOSIS — J4520 Mild intermittent asthma, uncomplicated: Secondary | ICD-10-CM

## 2021-03-05 DIAGNOSIS — C641 Malignant neoplasm of right kidney, except renal pelvis: Secondary | ICD-10-CM

## 2021-03-05 DIAGNOSIS — E1169 Type 2 diabetes mellitus with other specified complication: Secondary | ICD-10-CM

## 2021-03-05 DIAGNOSIS — E118 Type 2 diabetes mellitus with unspecified complications: Secondary | ICD-10-CM

## 2021-03-05 DIAGNOSIS — E785 Hyperlipidemia, unspecified: Secondary | ICD-10-CM

## 2021-03-05 DIAGNOSIS — I10 Essential (primary) hypertension: Secondary | ICD-10-CM | POA: Diagnosis not present

## 2021-03-05 DIAGNOSIS — E669 Obesity, unspecified: Secondary | ICD-10-CM

## 2021-03-05 MED ORDER — ALBUTEROL SULFATE (2.5 MG/3ML) 0.083% IN NEBU: 2.5000 mg | INHALATION_SOLUTION | Freq: Three times a day (TID) | RESPIRATORY_TRACT | 1 refills | Status: DC | PRN

## 2021-03-05 MED ORDER — HYDROCHLOROTHIAZIDE 12.5 MG PO TABS: 25.0000 mg | ORAL_TABLET | Freq: Every day | ORAL | 3 refills | Status: DC

## 2021-03-05 NOTE — Assessment & Plan Note (Addendum)
Start HTZC 25MG  daily Continue losatan 50mg  daily Metoprolol 100mg  BID NIFEDIPINE 90mg  daily. Monitor bp at home, keep a log and bring to next visit in 5 weeks Avoid salt, do regular exercise 30 minutes 5 times a week.

## 2021-03-05 NOTE — Assessment & Plan Note (Addendum)
Lab Results  Component Value Date   HGBA1C 6.2 (H) 03/01/2021  continue current med.

## 2021-03-05 NOTE — Progress Notes (Signed)
   Lindsey Buckley     MRN: [id number]      DOB: 01-06-1951   HPI [name] is here for follow up and re-evaluation of chronic medical conditions, medication management and review of any available recent lab and radiology data.  Preventive health is updated, specifically  Cancer screening and Immunization.   Questions or concerns regarding consultations or procedures which the PT has had in the interim are  addressed. The PT denies any adverse reactions to current medications since the last visit.  There are no new concerns.  There are no specific complaints   ROS Denies recent fever or chills. Denies sinus pressure, nasal congestion, ear pain or sore throat. Denies chest congestion, productive cough or wheezing. Denies chest pains, palpitations and leg swelling Denies abdominal pain, nausea, vomiting,diarrhea or constipation.   Denies dysuria, frequency, hesitancy or incontinence. Denies joint pain, swelling and limitation in mobility. Denies headaches, seizures, numbness, or tingling. Denies depression, anxiety or insomnia. Denies skin break down or rash.   PE  BP (!) 174/92 (BP Location: Left Arm, Patient Position: Sitting, Cuff Size: Normal)   Pulse 75   Ht 5\' 4"  (1.626 m)   Wt 198 lb (89.8 kg)   SpO2 93%   BMI 33.99 kg/m   Patient alert and oriented and in no cardiopulmonary distress.  HEENT: No facial asymmetry, EOMI,     Neck supple .  Chest: Clear to auscultation bilaterally.  CVS: S1, S2 no murmurs, no S3.Regular rate.  ABD: Soft non tender.   Ext: No edema  MS: Adequate ROM spine, shoulders, hips and knees.  Skin: Intact, no ulcerations or rash noted.  Psych: Good eye contact, normal affect. Memory intact not anxious or depressed appearing.  CNS: CN 2-12 intact, power,  normal throughout.no focal deficits noted.   Assessment & Plan

## 2021-03-05 NOTE — Assessment & Plan Note (Signed)
Eat a healthy diet, including lots of fruits and vegetables. Avoid foods with a lot of saturated and trans fats, such as red meat, butter, fried foods and cheese . Maintain a healthy weight. engage in regular exercise 30 minutes 5 times a week.

## 2021-03-05 NOTE — Assessment & Plan Note (Signed)
Followed by urology.   

## 2021-03-05 NOTE — Patient Instructions (Signed)
Start taking hydrochlothiazide 25mg  daily instead of 12.5mg   Follow up in 4 weeks to recheck your blood pressure    It is important that you exercise regularly at least 30 minutes 5 times a week.  Think about what you will eat, plan ahead. Choose " clean, green, fresh or frozen" over canned, processed or packaged foods which are more sugary, salty and fatty. 70 to 75% of food eaten should be vegetables and fruit. Three meals at set times with snacks allowed between meals, but they must be fruit or vegetables. Aim to eat over a 12 hour period , example 7 am to 7 pm, and STOP after  your last meal of the day. Drink water,generally about 64 ounces per day, no other drink is as healthy. Fruit juice is best enjoyed in a healthy way, by EATING the fruit.  Thanks for choosing [facility], we consider it a privelige to serve you.

## 2021-03-05 NOTE — Assessment & Plan Note (Signed)
Lab Results  Component Value Date   CHOL 196 03/01/2021   HDL 63 03/01/2021   LDLCALC 107 (H) 03/01/2021   TRIG 153 (H) 03/01/2021   CHOLHDL 2.9 01/23/2016  takes crestor 40mg . Eat a healthy diet, including lots of fruits and vegetables. Avoid foods with a lot of saturated and trans fats, such as red meat, butter, fried foods and cheese . Maintain a healthy weight.

## 2021-03-05 NOTE — Assessment & Plan Note (Signed)
Well controlled  meds refilled Nebulizer ordered

## 2021-03-10 NOTE — Progress Notes (Signed)
You will see her on 04/09/21.

## 2021-03-12 MED ORDER — ALBUTEROL SULFATE HFA 108 (90 BASE) MCG/ACT IN AERS: 2.0000 | INHALATION_SPRAY | RESPIRATORY_TRACT | 3 refills | Status: DC | PRN

## 2021-03-12 NOTE — Addendum Note (Signed)
Addended by: Renee Rival on: 03/12/2021 09:16 AM   Modules accepted: Orders

## 2021-03-17 ENCOUNTER — Telehealth: Payer: Self-pay

## 2021-03-17 ENCOUNTER — Other Ambulatory Visit: Payer: Self-pay | Admitting: *Deleted

## 2021-03-17 MED ORDER — ALBUTEROL SULFATE HFA 108 (90 BASE) MCG/ACT IN AERS: 2.0000 | INHALATION_SPRAY | RESPIRATORY_TRACT | 3 refills | Status: DC | PRN

## 2021-03-17 NOTE — Telephone Encounter (Signed)
Medication has been sent into [place]

## 2021-03-17 NOTE — Telephone Encounter (Signed)
Patient called said this medicine was sent to the wrong pharmacy albuterol (VENTOLIN HFA) 108 (90 Base) MCG/ACT inhaler   Needs to be sent in to [facility]

## 2021-03-30 ENCOUNTER — Telehealth: Payer: Self-pay | Admitting: Nurse Practitioner

## 2021-03-30 NOTE — Telephone Encounter (Signed)
Please call Caryl Pina with [facility] back regarding pt

## 2021-03-31 ENCOUNTER — Other Ambulatory Visit: Payer: Self-pay

## 2021-03-31 ENCOUNTER — Other Ambulatory Visit: Payer: Medicare Other

## 2021-04-01 LAB — COMPREHENSIVE METABOLIC PANEL
ALT: 21 IU/L (ref 0–32)
AST: 27 IU/L (ref 0–40)
Albumin/Globulin Ratio: 1.7 (ref 1.2–2.2)
Albumin: 4.5 g/dL (ref 3.8–4.8)
Alkaline Phosphatase: 73 IU/L (ref 44–121)
BUN/Creatinine Ratio: 10 — ABNORMAL LOW (ref 12–28)
BUN: 16 mg/dL (ref 8–27)
Bilirubin Total: 0.5 mg/dL (ref 0.0–1.2)
CO2: 24 mmol/L (ref 20–29)
Calcium: 10 mg/dL (ref 8.7–10.3)
Chloride: 105 mmol/L (ref 96–106)
Creatinine, Ser: 1.67 mg/dL — ABNORMAL HIGH (ref 0.57–1.00)
Globulin, Total: 2.6 g/dL (ref 1.5–4.5)
Glucose: 91 mg/dL (ref 70–99)
Potassium: 4 mmol/L (ref 3.5–5.2)
Sodium: 144 mmol/L (ref 134–144)
Total Protein: 7.1 g/dL (ref 6.0–8.5)
eGFR: 33 mL/min/{1.73_m2} — ABNORMAL LOW (ref >59)

## 2021-04-06 ENCOUNTER — Telehealth: Payer: Self-pay

## 2021-04-06 NOTE — Telephone Encounter (Signed)
-----   Message from Cleon Gustin, MD sent at 04/06/2021  9:22 AM EST ----- Creatinine is increased. She needs to increase her water intake and contact her PCP ----- Message ----- From: Dorisann Frames, RN Sent: 04/01/2021   8:57 AM EST To: Cleon Gustin, MD  Please review

## 2021-04-06 NOTE — Telephone Encounter (Signed)
Patient called and made aware. Voiced Understanding.  Results sent to PCP as well.

## 2021-04-07 ENCOUNTER — Ambulatory Visit: Payer: Medicare Other | Admitting: Urology

## 2021-04-09 ENCOUNTER — Other Ambulatory Visit: Payer: Self-pay

## 2021-04-09 ENCOUNTER — Ambulatory Visit (INDEPENDENT_AMBULATORY_CARE_PROVIDER_SITE_OTHER): Payer: Medicare Other | Admitting: Nurse Practitioner

## 2021-04-09 VITALS — BP 159/93 | HR 71 | Ht 64.0 in | Wt 197.0 lb

## 2021-04-09 DIAGNOSIS — I10 Essential (primary) hypertension: Secondary | ICD-10-CM | POA: Diagnosis not present

## 2021-04-09 DIAGNOSIS — E785 Hyperlipidemia, unspecified: Secondary | ICD-10-CM

## 2021-04-09 DIAGNOSIS — E118 Type 2 diabetes mellitus with unspecified complications: Secondary | ICD-10-CM

## 2021-04-09 DIAGNOSIS — E1169 Type 2 diabetes mellitus with other specified complication: Secondary | ICD-10-CM | POA: Diagnosis not present

## 2021-04-09 MED ORDER — HYDROCHLOROTHIAZIDE 25 MG PO TABS: 25.0000 mg | ORAL_TABLET | Freq: Every day | ORAL | 3 refills | Status: DC

## 2021-04-09 NOTE — Assessment & Plan Note (Signed)
Lab Results  Component Value Date   HGBA1C 6.2 (H) 03/01/2021  continue current med

## 2021-04-09 NOTE — Patient Instructions (Addendum)
Take hydrochlorothiazide 25mg  daily for your blood pressure,  Please get your lab work done 3-5 days before your next visit.     It is important that you exercise regularly at least 30 minutes 5 times a week.  Think about what you will eat, plan ahead. Choose " clean, green, fresh or frozen" over canned, processed or packaged foods which are more sugary, salty and fatty. 70 to 75% of food eaten should be vegetables and fruit. Three meals at set times with snacks allowed between meals, but they must be fruit or vegetables. Aim to eat over a 12 hour period , example 7 am to 7 pm, and STOP after  your last meal of the day. Drink water,generally about 64 ounces per day, no other drink is as healthy. Fruit juice is best enjoyed in a healthy way, by EATING the fruit.  Thanks for choosing [facility], we consider it a privelige to serve you.

## 2021-04-09 NOTE — Assessment & Plan Note (Signed)
Check lipid panel next visit. Continue crestor 40mg  daily.  Avoid fats, fried foods

## 2021-04-09 NOTE — Assessment & Plan Note (Signed)
DASH diet and commitment to daily physical activity for a minimum of 30 minutes discussed and encouraged, as a part of hypertension management. The importance of attaining a healthy weight is also discussed.  BP/Weight 04/09/2021 03/05/2021 01/19/2021 12/22/2020 11/21/2020 05/09/8946 06/05/7581  Systolic BP 074 600 - 298 473 085 694  Diastolic BP 93 92 - 81 84 75 87  Wt. (Lbs) 197 198 197 200 197 - 193.8  BMI 33.81 33.99 33.81 34.33 33.81 - 33.27  pt told to take hydrochlorothiazide 25mg  daily but  She has been taking 12.5mg  daily instead.   No changes made to meds today , start taking hydrochlorothiazide 25mg  daily,continue  Nifedipine,90mg  daily metoprolol  l100mg  bid , losartan 100mg 

## 2021-04-09 NOTE — Progress Notes (Signed)
° °  Lindsey Buckley     MRN: [id number]      DOB: 1951/01/31   HPI [name] is here for follow up and re-evaluation of chronic medical conditions, medication management and review of any available recent lab and radiology data.  Preventive health is updated, specifically  Cancer screening and Immunization.   Questions or concerns regarding consultations or procedures which the PT has had in the interim are  addressed. The PT denies any adverse reactions to current medications since the last visit.  There are no new concerns.  There are no specific complaints   Refused pneumonia vaccine, shingles vaccine  She has been taking hydrochlorothiazide 12.5mg  instaed of 25mg  , BP is still elevated today.   ROS Denies recent fever or chills. Denies sinus pressure, nasal congestion, ear pain or sore throat. Denies chest congestion, productive cough or wheezing. Denies chest pains, palpitations and leg swelling Denies abdominal pain, nausea, vomiting,diarrhea or constipation.   Denies dysuria, frequency, hesitancy or incontinence. Denies joint pain, swelling and limitation in mobility. Denies headaches, seizures, numbness, or tingling. Denies depression, anxiety or insomnia. Denies skin break down or rash.   PE  BP (!) 159/93    Pulse 71    Ht 5\' 4"  (1.626 m)    Wt 197 lb (89.4 kg)    SpO2 94%    BMI 33.81 kg/m   Patient alert and oriented and in no cardiopulmonary distress.  HEENT: No facial asymmetry, EOMI,     Neck supple .  Chest: Clear to auscultation bilaterally.  CVS: S1, S2 no murmurs, no S3.Regular rate.  ABD: Soft non tender.   Ext: No edema  MS: Adequate ROM spine, shoulders, hips and knees.  Skin: Intact, no ulcerations or rash noted.  Psych: Good eye contact, normal affect. Memory intact not anxious or depressed appearing.  CNS: CN 2-12 intact, power,  normal throughout.no focal deficits noted.   Assessment & Plan

## 2021-04-14 ENCOUNTER — Other Ambulatory Visit: Payer: Self-pay | Admitting: Nurse Practitioner

## 2021-04-14 ENCOUNTER — Other Ambulatory Visit: Payer: Self-pay

## 2021-04-14 ENCOUNTER — Ambulatory Visit (HOSPITAL_COMMUNITY)
Admission: RE | Admit: 2021-04-14 | Discharge: 2021-04-14 | Disposition: A | Discharge status: Home / Self Care | Payer: Medicare Other | Source: Ambulatory Visit | Attending: Urology | Admitting: Urology

## 2021-04-14 DIAGNOSIS — C641 Malignant neoplasm of right kidney, except renal pelvis: Secondary | ICD-10-CM | POA: Insufficient documentation

## 2021-04-14 DIAGNOSIS — J4520 Mild intermittent asthma, uncomplicated: Secondary | ICD-10-CM

## 2021-05-15 ENCOUNTER — Other Ambulatory Visit: Payer: Self-pay | Admitting: Family Medicine

## 2021-05-28 ENCOUNTER — Encounter: Payer: Self-pay | Admitting: Internal Medicine

## 2021-05-28 ENCOUNTER — Ambulatory Visit (INDEPENDENT_AMBULATORY_CARE_PROVIDER_SITE_OTHER): Payer: Medicare Other | Admitting: Internal Medicine

## 2021-05-28 ENCOUNTER — Other Ambulatory Visit: Payer: Self-pay

## 2021-05-28 DIAGNOSIS — J45991 Cough variant asthma: Secondary | ICD-10-CM

## 2021-05-28 DIAGNOSIS — R0609 Other forms of dyspnea: Secondary | ICD-10-CM

## 2021-05-28 MED ORDER — BUDESONIDE-FORMOTEROL FUMARATE 80-4.5 MCG/ACT IN AERO: INHALATION_SPRAY | RESPIRATORY_TRACT | 12 refills | Status: DC

## 2021-05-28 MED ORDER — PREDNISONE 10 MG PO TABS: ORAL_TABLET | ORAL | 0 refills | Status: DC

## 2021-05-28 NOTE — Progress Notes (Signed)
Lindsey Buckley, female    DOB: 08/13/1950,    MRN: [id number]   Brief patient profile:  71   yobf  light smoker quit smoking ? 2001 with onset of asthma w/in a few years of stopping was admitted with asthma to [facility]  referred to pulmonary clinic in [place]  05/28/2021 by Varney Baas NP with Dr Moshe Cipro  for asthma  on prn saba since 2010      History of Present Illness  05/28/2021  Pulmonary/ 1st office eval/ Joselinne Lawal / [name] cough  x  4 months  Chief Complaint  Patient presents with   Consult    REF FOR ASTHMA    Dyspnea:  limited L ankle more than breathing @ relatively  slow pace - does not do steps(no need) Cough: after supper worse/ variable mucus white/ on ppi bid but not ac and prior h/o ace cough  Sleep: on R side/ bed is flat / 3 pillows nightly cough/ wheeze  SABA use: avg one or two a day - helps the cough after a few years  Prednisone helped cough transiently   No obvious day to day or daytime variability or assoc excess/ purulent sputum or mucus plugs or hemoptysis or cp or chest tightness, subjective wheeze or overt sinus or hb symptoms.   Sleeping  without nocturnal  or early am exacerbation  of respiratory  c/o's or need for noct saba. Also denies any obvious fluctuation of symptoms with weather or environmental changes or other aggravating or alleviating factors except as outlined above   No unusual exposure hx or h/o childhood pna/ asthma or knowledge of premature birth.  Current Allergies, Complete Past Medical History, Past Surgical History, Family History, and Social History were reviewed in [employer] record.  ROS  The following are not active complaints unless bolded Hoarseness, sore throat, dysphagia, dental problems, itching, sneezing,  nasal congestion or discharge of excess mucus or purulent secretions, ear ache,   fever, chills, sweats, unintended wt loss or wt gain, classically pleuritic or exertional cp,   orthopnea pnd or arm/hand swelling  or leg swelling, presyncope, palpitations, abdominal pain, anorexia, nausea, vomiting, diarrhea  or change in bowel habits or change in bladder habits, change in stools or change in urine, dysuria, hematuria,  rash, arthralgias, visual complaints, headache, numbness, weakness or ataxia or problems with walking or coordination,  change in mood or  memory.           Past Medical History:  Diagnosis Date   Asthma 2007   smoke trigged   Chest pain    history of   Diabetes mellitus without complication ([place]) 5093   borderline diabetes per pt per MD    Dysrhythmia 2003   Eczema    GERD (gastroesophageal reflux disease)    Hyperlipidemia    Other and unspecified hyperlipidemia    Pulmonary congestion and hypostasis 2003   Pulmonary edema 2003   Unspecified essential hypertension     Outpatient Medications Prior to Visit  Medication Sig Dispense Refill   albuterol (PROVENTIL) (2.5 MG/3ML) 0.083% nebulizer solution Take 3 mLs (2.5 mg total) by nebulization 3 (three) times daily as needed for wheezing or shortness of breath. 75 mL 1   albuterol (VENTOLIN HFA) 108 (90 Base) MCG/ACT inhaler Inhale 2 puffs into the lungs every 4 (four) hours as needed. 6.7 g 3   carboxymethylcellulose (REFRESH PLUS) 0.5 % SOLN Place 1 drop into both eyes 3 (three) times daily as  needed (dry/irritated eyes.).     diphenhydrAMINE (BENADRYL) 25 mg capsule Take 50 mg by mouth every 6 (six) hours as needed for itching (eczema).     FARXIGA 10 MG TABS tablet Take 1 tablet (10 mg total) by mouth daily. 90 tablet 3   hydrochlorothiazide (HYDRODIURIL) 25 MG tablet Take 1 tablet (25 mg total) by mouth daily. 90 tablet 3   losartan (COZAAR) 50 MG tablet Take 1 tablet (50 mg total) by mouth daily. 90 tablet 3   metoprolol succinate (TOPROL-XL) 100 MG 24 hr tablet Take 1 tablet (100 mg total) by mouth in the morning and at bedtime. TAKE 1 TABLET(100 MG) BY MOUTH TWICE DAILY 180 tablet 3    NIFEdipine (PROCARDIA XL/NIFEDICAL-XL) 90 MG 24 hr tablet Take 1 tablet (90 mg total) by mouth daily. 90 tablet 3   omeprazole (PRILOSEC) 20 MG capsule Take 20 mg by mouth in the morning and at bedtime.     rosuvastatin (CRESTOR) 40 MG tablet Take 1 tablet (40 mg total) by mouth daily. 90 tablet 3   XIIDRA 5 % SOLN Place 1 drop into both eyes daily as needed for dry eyes.     montelukast (SINGULAIR) 10 MG tablet TAKE 1 TABLET(10 MG) BY MOUTH AT BEDTIME 30 tablet 3   No facility-administered medications prior to visit.     Objective:     BP 132/78 (BP Location: Left Arm, Patient Position: Sitting)    Pulse 78    Temp 98 F (36.7 C)    Ht 5\' 5"  (1.651 m)    Wt 197 lb 12.8 oz (89.7 kg)    SpO2 98% Comment: ra   BMI 32.92 kg/m   SpO2: 98 % (ra)  Amb bf nad    HEENT : pt wearing mask not removed for exam due to covid -19 concerns.    NECK :  without JVD/Nodes/TM/ nl carotid upstrokes bilaterally   LUNGS: no acc muscle use,  Nl contour chest which is clear to A and P bilaterally without cough on insp or exp maneuvers   CV:  RRR  no s3 or murmur or increase in P2, and no edema   ABD:  soft and nontender with nl inspiratory excursion in the supine position. No bruits or organomegaly appreciated, bowel sounds nl  MS:  Nl gait/ ext warm without deformities, calf tenderness, cyanosis or clubbing No obvious joint restrictions   SKIN: warm and dry without lesions    NEURO:  alert, approp, nl sensorium with  no motor or cerebellar deficits apparent.     I personally reviewed images and agree with radiology impression as follows:  CXR:   portable 04/14/21  Low lung volumes with mild reticulonodular opacities at the bases, potentially atelectasis/scarring or alternatively sequela of atypical infection      Assessment   Cough variant asthma vs uacs Onset 2001 around same time quit smoking (light) - h/o ACEi intol  - 05/28/2021  After extensive coaching inhaler device,   effectiveness =    25% (from a baseline of 0) >> trys symbicort 80 2bid and prn saba   DDX of  difficult airways management almost all start with A and  include Adherence, Ace Inhibitors, Acid Reflux, Active Sinus Disease, Alpha 1 Antitripsin deficiency, Anxiety masquerading as Airways dz,  ABPA,  Allergy(esp in young), Aspiration (esp in elderly), Adverse effects of meds,  Active smoking or vaping, A bunch of PE's (a small clot burden can't cause this syndrome unless there is already  severe underlying pulm or vascular dz with poor reserve) plus two Bs  = Bronchiectasis and Beta blocker use..and one C= CHF  Adherence is always the initial "prime suspect" and is a multilayered concern that requires a "trust but verify" approach in every patient - starting with knowing how to use medications, especially inhalers, correctly, keeping up with refills and understanding the fundamental difference between maintenance and prns vs those medications only taken for a very short course and then stopped and not refilled.  - see hfa teaching - return with all meds in hand using a trust but verify approach to confirm accurate Medication  Reconciliation The principal here is that until we are certain that the  patients are doing what we've asked, it makes no sense to ask them to do more.   ? Acid (or non-acid) GERD > always difficult to exclude as up to 75% of pts in some series report no assoc GI/ Heartburn symptoms> rec max (24h)  acid suppression and diet restrictions/ reviewed and instructions given in writing.   ? Adverse drug effects > high dose procardia my not be best choice if does prove to have bad gerd driving the cough esp noct  ? Ace case ACE inhibitors are problematic in  pts with airway complaints because  even experienced pulmonologists can't always distinguish ace effects from copd/asthma/pnds/ allergies etc.  By themselves they don't actually cause a problem, much like oxygen can't by itself start a  fire, but they certainly serve as a powerful catalyst or enhancer for any "fire"  or inflammatory process in the upper airway, be it caused by an ET  tube or more commonly reflux (especially in the obese or pts with known GERD or who are on biphoshonates) or URI's, due to interference with bradykinin clearance.  ? Beta blocker effects of high dose lopressor  In the setting of respiratory symptoms of unknown etiology,  It would be preferable to use bystolic, the most beta -1  selective Beta blocker available in sample form, with bisoprolol the most selective generic choice  on the market, at least on a trial basis, to make sure the spillover Beta 2 effects of the less specific Beta blockers are not contributing to this patient's symptoms.         DOE (dyspnea on exertion) Onset  - 05/28/2021   Walked on RA x 3  lap(s) =  approx 450   ft  @ moderate pace, stopped due to end of study s  Sob  with lowest 02 sats 95%   I believe this is wt gain/ conditioning related / no further w/u for now  F/u in 6 weeks  Each maintenance medication was reviewed in detail including emphasizing most importantly the difference between maintenance and prns and under what circumstances the prns are to be triggered using an action plan format where appropriate.  Total time for H and P, chart review, counseling, reviewing hfa device(s) , directly observing portions of ambulatory 02 saturation study/ and generating customized AVS unique to this office visit / same day charting  > 45 min                   Christinia Gully, MD 05/28/2021

## 2021-05-28 NOTE — Assessment & Plan Note (Signed)
Onset  - 05/28/2021   Walked on RA x 3  lap(s) =  approx 450   ft  @ moderate pace, stopped due to end of study s  Sob  with lowest 02 sats 95%   I believe this is wt gain/ conditioning related / no further w/u for now  F/u in 6 weeks  Each maintenance medication was reviewed in detail including emphasizing most importantly the difference between maintenance and prns and under what circumstances the prns are to be triggered using an action plan format where appropriate.  Total time for H and P, chart review, counseling, reviewing hfa device(s) , directly observing portions of ambulatory 02 saturation study/ and generating customized AVS unique to this office visit / same day charting  > 45 min

## 2021-05-28 NOTE — Patient Instructions (Addendum)
Please remember to go to the lab department   for your tests - we will call you with the results when they are available.       Plan A = Automatic = Always=    Symbicort 80 Take 2 puffs first thing in am and then another 2 puffs about 12 hours later.   Work on inhaler technique:  relax and gently blow all the way out then take a nice smooth full deep breath back in, triggering the inhaler at same time you start breathing in.  Hold for up to 5 seconds if you can. Blow out thru nose. Rinse and gargle with water when done.  If mouth or throat bother you at all,  try brushing teeth/gums/tongue with arm and hammer toothpaste/ make a slurry and gargle and spit out.    Plan B = Backup (to supplement plan A, not to replace it) Only use your albuterol inhaler as a rescue medication to be used if you can't catch your breath by resting or doing a relaxed purse lip breathing pattern.  - The less you use it, the better it will work when you need it. - Ok to use the inhaler up to 2 puffs  every 4 hours if you must but call for appointment if use goes up over your usual need - Don't leave home without it !!  (think of it like the spare tire for your car)   Plan C = Crisis (instead of Plan B but only if Plan B stops working) - only use your albuterol nebulizer if you first try Plan B and it fails to help > ok to use the nebulizer up to every 4 hours but if start needing it regularly call for immediate appointment  Prednisone 10 mg take  4 each am x 2 days,   2 each am x 2 days,  1 each am x 2 days and stop   Continue prilosec 20 mg Take 30- 60 min before your first and last meals of the day   GERD (REFLUX)  is an extremely common cause of respiratory symptoms just like yours , many times with no obvious heartburn at all.    It can be treated with medication, but also with lifestyle changes including elevation of the head of your bed (ideally with 6 -8inch blocks under the headboard of your bed),  Smoking  cessation, avoidance of late meals, excessive alcohol, and avoid fatty foods, chocolate, peppermint, colas, red wine, and acidic juices such as orange juice.  NO MINT OR MENTHOL PRODUCTS SO NO COUGH DROPS  USE SUGARLESS CANDY INSTEAD (Jolley ranchers or Stover's or Life Savers) or even ice chips will also do - the key is to swallow to prevent all throat clearing. NO OIL BASED VITAMINS - use powdered substitutes.  Avoid fish oil when coughing.   Please schedule a follow up office visit in 4 weeks, sooner if needed  with all medications /inhalers/ solutions in hand so we can verify exactly what you are taking. This includes all medications from all doctors and over the counters

## 2021-05-28 NOTE — Assessment & Plan Note (Signed)
Onset 2001 around same time quit smoking (light) - h/o ACEi intol  - 05/28/2021  After extensive coaching inhaler device,  effectiveness =    25% (from a baseline of 0) >> trys symbicort 80 2bid and prn saba   DDX of  difficult airways management almost all start with A and  include Adherence, Ace Inhibitors, Acid Reflux, Active Sinus Disease, Alpha 1 Antitripsin deficiency, Anxiety masquerading as Airways dz,  ABPA,  Allergy(esp in young), Aspiration (esp in elderly), Adverse effects of meds,  Active smoking or vaping, A bunch of PE's (a small clot burden can't cause this syndrome unless there is already severe underlying pulm or vascular dz with poor reserve) plus two Bs  = Bronchiectasis and Beta blocker use..and one C= CHF  Adherence is always the initial "prime suspect" and is a multilayered concern that requires a "trust but verify" approach in every patient - starting with knowing how to use medications, especially inhalers, correctly, keeping up with refills and understanding the fundamental difference between maintenance and prns vs those medications only taken for a very short course and then stopped and not refilled.  - see hfa teaching - return with all meds in hand using a trust but verify approach to confirm accurate Medication  Reconciliation The principal here is that until we are certain that the  patients are doing what we've asked, it makes no sense to ask them to do more.   ? Acid (or non-acid) GERD > always difficult to exclude as up to 75% of pts in some series report no assoc GI/ Heartburn symptoms> rec max (24h)  acid suppression and diet restrictions/ reviewed and instructions given in writing.   ? Adverse drug effects > high dose procardia my not be best choice if does prove to have bad gerd driving the cough esp noct  ? Ace case ACE inhibitors are problematic in  pts with airway complaints because  even experienced pulmonologists can't always distinguish ace effects from  copd/asthma/pnds/ allergies etc.  By themselves they don't actually cause a problem, much like oxygen can't by itself start a fire, but they certainly serve as a powerful catalyst or enhancer for any "fire"  or inflammatory process in the upper airway, be it caused by an ET  tube or more commonly reflux (especially in the obese or pts with known GERD or who are on biphoshonates) or URI's, due to interference with bradykinin clearance.  ? Beta blocker effects of high dose lopressor  In the setting of respiratory symptoms of unknown etiology,  It would be preferable to use bystolic, the most beta -1  selective Beta blocker available in sample form, with bisoprolol the most selective generic choice  on the market, at least on a trial basis, to make sure the spillover Beta 2 effects of the less specific Beta blockers are not contributing to this patient's symptoms.

## 2021-06-04 LAB — CBC WITH DIFFERENTIAL/PLATELET
Basophils Absolute: 0.1 10*3/uL (ref 0.0–0.2)
Basos: 0 %
EOS (ABSOLUTE): 0 10*3/uL (ref 0.0–0.4)
Eos: 0 %
Hematocrit: 39.5 % (ref 34.0–46.6)
Hemoglobin: 13.2 g/dL (ref 11.1–15.9)
Immature Grans (Abs): 0 10*3/uL (ref 0.0–0.1)
Immature Granulocytes: 0 %
Lymphocytes Absolute: 1.2 10*3/uL (ref 0.7–3.1)
Lymphs: 9 %
MCH: 29 pg (ref 26.6–33.0)
MCHC: 33.4 g/dL (ref 31.5–35.7)
MCV: 87 fL (ref 79–97)
Monocytes Absolute: 0.6 10*3/uL (ref 0.1–0.9)
Monocytes: 4 %
Neutrophils Absolute: 11.4 10*3/uL — ABNORMAL HIGH (ref 1.4–7.0)
Neutrophils: 87 %
Platelets: 332 10*3/uL (ref 150–450)
RBC: 4.55 x10E6/uL (ref 3.77–5.28)
RDW: 12.5 % (ref 11.7–15.4)
WBC: 13.2 10*3/uL — ABNORMAL HIGH (ref 3.4–10.8)

## 2021-06-04 LAB — IGE: IgE (Immunoglobulin E), Serum: 1418 IU/mL — ABNORMAL HIGH (ref 6–495)

## 2021-07-04 NOTE — Progress Notes (Signed)
? ?Lindsey Buckley, female    DOB: 23-Nov-1950,    MRN: [id number] ? ? ?Brief patient profile:  ?71  yobf  light smoker quit smoking ? 2001 with onset of asthma w/in a few years of stopping was admitted with asthma to [facility]  referred to pulmonary clinic in [place]  05/28/2021 by Varney Baas NP with Dr Moshe Cipro  for asthma  on prn saba since 2010  ? ? ? ? ?History of Present Illness  ?05/28/2021  Pulmonary/ 1st office eval/ Lindsey Buckley / [name] cough  x  4 months  ?Chief Complaint  ?Patient presents with  ? Consult  ?  REF FOR ASTHMA   ? Dyspnea:  limited L ankle more than breathing @ relatively  slow pace - does not do steps(no need) ?Cough: after supper worse/ variable mucus white/ on ppi bid but not ac and prior h/o ace cough  ?Sleep: on R side/ bed is flat / 3 pillows nightly cough/ wheeze  ?SABA use: avg one or two a day - helps the cough after a few years  ?Prednisone helped cough transiently   ?Rec. ?Plan A = Automatic = Always=    Symbicort 80 Take 2 puffs first thing in am and then another 2 puffs about 12 hours later.  ?Work on inhaler technique:   ?Plan B = Backup (to supplement plan A, not to replace it) ?Only use your albuterol inhaler as a rescue medication  ?Plan C = Crisis (instead of Plan B but only if Plan B stops working) ?- only use your albuterol nebulizer if you first try Plan B and it fails to help ?Prednisone 10 mg take  4 each am x 2 days,   2 each am x 2 days,  1 each am x 2 days and stop  ?Continue prilosec 20 mg Take 30- 60 min before your first and last meals of the day  ?GERD diet reviewed, bed blocks rec  ?Please schedule a follow up office visit in 4 weeks, sooner if needed  with all medications /inhalers/ solutions in hand  ? ? ? Allergy screen 05/28/21  >  Eos 0.0 /  IgE  1418  ? ?07/05/2021  f/u ov/[place] office/Lindsey Buckley no meds but brought inhalers re: allergic asthma maint on symbicort  though hfa very poor  ?Chief Complaint  ?Patient presents with  ? Follow-up  ?   Patient is doing good, no concerns.   ? Dyspnea:  ankle is getting better / walking around house and some outdoors now  ?Cough: minimal after prednisone  ?Sleeping: better flat bed/ 3 pillows no noct cc  ?SABA use: varies with ex/ outdoors ?02: no  ?Covid status: vax x 2  ?  ? ? ?No obvious day to day or daytime variability or assoc excess/ purulent sputum or mucus plugs or hemoptysis or cp or chest tightness, subjective wheeze or overt sinus or hb symptoms.  ? ?Sleeping  without nocturnal  or early am exacerbation  of respiratory  c/o's or need for noct saba. Also denies any obvious fluctuation of symptoms with weather or environmental changes or other aggravating or alleviating factors except as outlined above  ? ?No unusual exposure hx or h/o childhood pna/ asthma or knowledge of premature birth. ? ?Current Allergies, Complete Past Medical History, Past Surgical History, Family History, and Social History were reviewed in [employer] record. ? ?ROS  The following are not active complaints unless bolded ?Hoarseness, sore throat, dysphagia, dental problems, itching,  sneezing,  nasal congestion or discharge of excess mucus or purulent secretions, ear ache,   fever, chills, sweats, unintended wt loss or wt gain, classically pleuritic or exertional cp,  orthopnea pnd or arm/hand swelling  or leg swelling, presyncope, palpitations, abdominal pain, anorexia, nausea, vomiting, diarrhea  or change in bowel habits or change in bladder habits, change in stools or change in urine, dysuria, hematuria,  rash, arthralgias, visual complaints, headache, numbness, weakness or ataxia or problems with walking or coordination,  change in mood or  memory. ?      ? ?Current Meds  ?Medication Sig  ? albuterol (VENTOLIN HFA) 108 (90 Base) MCG/ACT inhaler Inhale 2 puffs into the lungs every 4 (four) hours as needed.  ? budesonide-formoterol (SYMBICORT) 80-4.5 MCG/ACT inhaler Take 2 puffs first thing in am and then  another 2 puffs about 12 hours later.  ? carboxymethylcellulose (REFRESH PLUS) 0.5 % SOLN Place 1 drop into both eyes 3 (three) times daily as needed (dry/irritated eyes.).  ? diphenhydrAMINE (BENADRYL) 25 mg capsule Take 50 mg by mouth every 6 (six) hours as needed for itching (eczema).  ? FARXIGA 10 MG TABS tablet Take 1 tablet (10 mg total) by mouth daily.  ? hydrochlorothiazide (HYDRODIURIL) 25 MG tablet Take 1 tablet (25 mg total) by mouth daily.  ? losartan (COZAAR) 50 MG tablet Take 1 tablet (50 mg total) by mouth daily.  ? metoprolol succinate (TOPROL-XL) 100 MG 24 hr tablet Take 1 tablet (100 mg total) by mouth in the morning and at bedtime. TAKE 1 TABLET(100 MG) BY MOUTH TWICE DAILY  ? NIFEdipine (PROCARDIA XL/NIFEDICAL-XL) 90 MG 24 hr tablet Take 1 tablet (90 mg total) by mouth daily.  ? omeprazole (PRILOSEC) 20 MG capsule Take 20 mg by mouth in the morning and at bedtime.  ? rosuvastatin (CRESTOR) 40 MG tablet Take 1 tablet (40 mg total) by mouth daily.  ? XIIDRA 5 % SOLN Place 1 drop into both eyes daily as needed for dry eyes.  ?     ?  ?   ? ?Past Medical History:  ?Diagnosis Date  ? Asthma 2007  ? smoke trigged  ? Chest pain   ? history of  ? Diabetes mellitus without complication ([place]) 5053  ? borderline diabetes per pt per MD   ? Dysrhythmia 2003  ? Eczema   ? GERD (gastroesophageal reflux disease)   ? Hyperlipidemia   ? Other and unspecified hyperlipidemia   ? Pulmonary congestion and hypostasis 2003  ? Pulmonary edema 2003  ? Unspecified essential hypertension   ? ?  ? ? ?Objective:  ?  ?  ?Wt Readings from Last 3 Encounters:  ?07/05/21 204 lb (92.5 kg)  ?05/28/21 197 lb 12.8 oz (89.7 kg)  ?04/09/21 197 lb (89.4 kg)  ?  ? ? ?Vital signs reviewed  07/05/2021  - Note at rest 02 sats  99% on RA  ? ?General appearance:    obese bf nad  ? ? ? HEENT : orophx clear, nl turbinates   ? ? ?NECK :  without JVD/Nodes/TM/ nl carotid upstrokes bilaterally ? ? ?LUNGS: no acc muscle use,  Nl contour chest which is  clear to A and P bilaterally without cough on insp or exp maneuvers ? ? ?CV:  RRR  no s3 or murmur or increase in P2, and no edema  ? ?ABD:  obese soft and nontender with nl inspiratory excursion in the supine position. No bruits or organomegaly appreciated, bowel sounds nl ? ?  MS:  Nl gait/ ext warm without deformities, calf tenderness, cyanosis or clubbing ?No obvious joint restrictions  ? ?SKIN: warm and dry without lesions   ? ?NEURO:  alert, approp, nl sensorium with  no motor or cerebellar deficits apparent.  ? ? ?  ? ? ?   ?Assessment  ? ?  ? ? ?  ?  ?   ?

## 2021-07-05 ENCOUNTER — Ambulatory Visit (INDEPENDENT_AMBULATORY_CARE_PROVIDER_SITE_OTHER): Payer: Medicare Other | Admitting: Internal Medicine

## 2021-07-05 ENCOUNTER — Encounter: Payer: Self-pay | Admitting: Internal Medicine

## 2021-07-05 DIAGNOSIS — J45991 Cough variant asthma: Secondary | ICD-10-CM | POA: Diagnosis not present

## 2021-07-05 MED ORDER — ALBUTEROL SULFATE HFA 108 (90 BASE) MCG/ACT IN AERS: 2.0000 | INHALATION_SPRAY | RESPIRATORY_TRACT | 11 refills | Status: DC | PRN

## 2021-07-05 MED ORDER — MONTELUKAST SODIUM 10 MG PO TABS: 10.0000 mg | ORAL_TABLET | Freq: Every day | ORAL | 11 refills | Status: DC

## 2021-07-05 NOTE — Assessment & Plan Note (Addendum)
Onset 2001 around same time quit smoking (light) ?- h/o ACEi intol  ?- 05/28/2021  After extensive coaching inhaler device,  effectiveness =    25% (from a baseline of 0) >> trys symbicort 80 2bid and prn saba ?- Allergy screen 05/28/21  >  Eos 0.0 /  IgE  1418  ?- 07/05/2021  After extensive coaching inhaler device,  effectiveness =    25% so added singulair qpm  ? ?Better p prednisone rx (despite high dose Toprol)  and maint ok so far on symb 80 despite very poor hfa so will see how she does on singulair with option for formal allergy eval next as there is a limit to inhalers (DPI's likely to bring back the cough)  ? ?Discussed in detail all the  indications, usual  risks and alternatives  relative to the benefits with patient who agrees to proceed with Rx as outlined.     ? ?F/u in 3 m with all meds in hand using a trust but verify approach to confirm accurate Medication  Reconciliation The principal here is that until we are certain that the  patients are doing what we've asked, it makes no sense to ask them to do more.  ? ? ?    ?  ? ?Each maintenance medication was reviewed in detail including emphasizing most importantly the difference between maintenance and prns and under what circumstances the prns are to be triggered using an action plan format where appropriate. ? ?Total time for H and P, chart review, counseling, reviewing hfa device(s) and generating customized AVS unique to this office visit / same day charting = 34 min  ?     ?

## 2021-07-05 NOTE — Patient Instructions (Signed)
Plan A = Automatic = Always=    Symbicort 80 Take 2 puffs first thing in am and then another 2 puffs about 12 hours later and monteklukast 10 mg every evening  ?  ? ?Work on inhaler technique:  relax and gently blow all the way out then take a nice smooth full deep breath back in, triggering the inhaler at same time you start breathing in.  Hold for up to 5 seconds if you can. Blow out thru nose. Rinse and gargle with water when done.  If mouth or throat bother you at all,  try brushing teeth/gums/tongue with arm and hammer toothpaste/ make a slurry and gargle and spit out.  ? ? - remember how golfers warm up  ? ?Plan B = Backup (to supplement plan A, not to replace it) ?Only use your albuterol inhaler as a rescue medication to be used if you can't catch your breath by resting or doing a relaxed purse lip breathing pattern.  ?- The less you use it, the better it will work when you need it. ?- Ok to use the inhaler up to 2 puffs  every 4 hours if you must but call for appointment if use goes up over your usual need ?- Don't leave home without it !!  (think of it like the spare tire for your car)  ? ?  ?Please schedule a follow up office visit in 6 weeks, call sooner if needed with all medications /inhalers/ solutions in hand so we can verify exactly what you are taking. This includes all medications from all doctors and over the counters  ?

## 2021-07-12 ENCOUNTER — Ambulatory Visit: Payer: Medicare Other | Admitting: Nurse Practitioner

## 2021-07-17 LAB — CMP14+EGFR
ALT: 26 IU/L (ref 0–32)
AST: 30 IU/L (ref 0–40)
Albumin/Globulin Ratio: 1.6 (ref 1.2–2.2)
Albumin: 4.7 g/dL (ref 3.8–4.8)
Alkaline Phosphatase: 71 IU/L (ref 44–121)
BUN/Creatinine Ratio: 15 (ref 12–28)
BUN: 25 mg/dL (ref 8–27)
Bilirubin Total: 0.7 mg/dL (ref 0.0–1.2)
CO2: 29 mmol/L (ref 20–29)
Calcium: 11.2 mg/dL — ABNORMAL HIGH (ref 8.7–10.3)
Chloride: 98 mmol/L (ref 96–106)
Creatinine, Ser: 1.65 mg/dL — ABNORMAL HIGH (ref 0.57–1.00)
Globulin, Total: 3 g/dL (ref 1.5–4.5)
Glucose: 108 mg/dL — ABNORMAL HIGH (ref 70–99)
Potassium: 4.3 mmol/L (ref 3.5–5.2)
Sodium: 143 mmol/L (ref 134–144)
Total Protein: 7.7 g/dL (ref 6.0–8.5)
eGFR: 33 mL/min/{1.73_m2} — ABNORMAL LOW (ref >59)

## 2021-07-17 LAB — LIPID PANEL
Chol/HDL Ratio: 3.2 ratio (ref 0.0–4.4)
Cholesterol, Total: 175 mg/dL (ref 100–199)
HDL: 55 mg/dL (ref >39)
LDL Chol Calc (NIH): 81 mg/dL (ref 0–99)
Triglycerides: 235 mg/dL — ABNORMAL HIGH (ref 0–149)
VLDL Cholesterol Cal: 39 mg/dL (ref 5–40)

## 2021-07-17 LAB — HEMOGLOBIN A1C
Est. average glucose Bld gHb Est-mCnc: 137 mg/dL
Hgb A1c MFr Bld: 6.4 % — ABNORMAL HIGH (ref 4.8–5.6)

## 2021-07-19 ENCOUNTER — Other Ambulatory Visit: Payer: Self-pay | Admitting: Nurse Practitioner

## 2021-07-19 DIAGNOSIS — N1832 Chronic kidney disease, stage 3b: Secondary | ICD-10-CM

## 2021-07-19 DIAGNOSIS — N184 Chronic kidney disease, stage 4 (severe): Secondary | ICD-10-CM

## 2021-07-19 DIAGNOSIS — E1169 Type 2 diabetes mellitus with other specified complication: Secondary | ICD-10-CM

## 2021-07-19 MED ORDER — EZETIMIBE 10 MG PO TABS: 10.0000 mg | ORAL_TABLET | Freq: Every day | ORAL | 3 refills | Status: DC

## 2021-07-20 ENCOUNTER — Encounter: Payer: Self-pay | Admitting: Nurse Practitioner

## 2021-07-20 ENCOUNTER — Ambulatory Visit (INDEPENDENT_AMBULATORY_CARE_PROVIDER_SITE_OTHER): Payer: Medicare Other | Admitting: Nurse Practitioner

## 2021-07-20 VITALS — BP 138/76 | HR 70 | Ht 64.0 in | Wt 197.0 lb

## 2021-07-20 DIAGNOSIS — E1169 Type 2 diabetes mellitus with other specified complication: Secondary | ICD-10-CM

## 2021-07-20 DIAGNOSIS — H9319 Tinnitus, unspecified ear: Secondary | ICD-10-CM | POA: Insufficient documentation

## 2021-07-20 DIAGNOSIS — N1832 Chronic kidney disease, stage 3b: Secondary | ICD-10-CM | POA: Insufficient documentation

## 2021-07-20 DIAGNOSIS — E785 Hyperlipidemia, unspecified: Secondary | ICD-10-CM

## 2021-07-20 DIAGNOSIS — I10 Essential (primary) hypertension: Secondary | ICD-10-CM | POA: Diagnosis not present

## 2021-07-20 DIAGNOSIS — E118 Type 2 diabetes mellitus with unspecified complications: Secondary | ICD-10-CM

## 2021-07-20 DIAGNOSIS — H9312 Tinnitus, left ear: Secondary | ICD-10-CM | POA: Diagnosis not present

## 2021-07-20 MED ORDER — HYDRALAZINE HCL 25 MG PO TABS: 25.0000 mg | ORAL_TABLET | Freq: Two times a day (BID) | ORAL | 2 refills | Status: DC

## 2021-07-20 NOTE — Patient Instructions (Addendum)
Pleas start taking hydralazine '25mg'$  two times daily for your blood pressure. Stop taking hydrochlorothiazide.  ?Stat taking ezetimbe '10mg'$  daily for your high cholesterol. ? ? ?Please get your shingles vaccine and pneumonia. ? ?It is important that you exercise regularly at least 30 minutes 5 times a week.  ?Think about what you will eat, plan ahead. ?Choose " clean, green, fresh or frozen" over canned, processed or packaged foods which are more sugary, salty and fatty. ?70 to 75% of food eaten should be vegetables and fruit. ?Three meals at set times with snacks allowed between meals, but they must be fruit or vegetables. ?Aim to eat over a 12 hour period , example 7 am to 7 pm, and STOP after  your last meal of the day. ?Drink water,generally about 64 ounces per day, no other drink is as healthy. Fruit juice is best enjoyed in a healthy way, by EATING the fruit. ? ?Thanks for choosing [facility], we consider it a privelige to serve you. ? ?

## 2021-07-20 NOTE — Assessment & Plan Note (Signed)
BP Readings from Last 3 Encounters:  ?07/20/21 138/76  ?07/05/21 130/80  ?05/28/21 132/78  ?She has been taking losartan 50 mg daily, hydrochlorothiazide 25 mg daily, metoprolol 100 mg daily, nifedipine 90 mg daily.  ?Patient told to stop taking hydrochlorothiazide 25 mg daily due to decreased kidney function and elevated calcium. ?Start hydralazine 25 mg twice daily, continue ordered blood pressure medication. ?Patient told to monitor BP at home. ?DASH diet advised, engage in regular exercises at least 150 minutes weekly ?Follow-up in 6 weeks, recheck CMP plus EGFR in 6 weeks.  ?

## 2021-07-20 NOTE — Progress Notes (Signed)
? ?Lindsey Buckley     MRN: [id number]      DOB: 1950-08-28 ? ? ?HPI ?[name] with past medical history of essential hypertension, GERD, hyperlipidemia associated with type 2 diabetes mellitus, renal cell cancer, obesity is here for follow up and re-evaluation of chronic medical conditions, medication management and review of any available recent lab  ? ?The PT denies any adverse reactions to current medications since the last visit.  ? ?She has been taking hydrochlorothiazide 40m daily, losartan 579mdaily, metoprolol 10037maily, nifedipine 59m25mily. She did not take hydrochlorothiazide and losartan today ,, she usually takes losartan 50mg21mly at lunch time. She checks her BP at home states that her BP is usually in the 130's at home.  Denies edema, dizziness, chest pain ? ?She has been talking calcium supplement , not sure of how much she has been taking, states that she was told to start taking calcium supplement due to low calcium in the past. ? ?Patient c/o chronic  ringing in the left ear, she saw someone at [facility]'' they told her that her ear was fine and that she needs ENT specialist evaluation, she was told that there is no cure for her condition, pt denies spinning sensation, ear pain, dizziness.  ? ?Has upcoming eye exam , refused shingles and pneumonia vaccines today , need for both vaccines discussed with pt, she verbalized understanding.  ? ? ? ? ?ROS ?Denies recent fever or chills. ?Denies sinus pressure, nasal congestion, ear pain or sore throat. ?Denies chest congestion, productive cough or wheezing. ?Denies chest pains, palpitations and leg swelling ?Denies abdominal pain, nausea, vomiting,diarrhea or constipation.   ?Denies dysuria, frequency, hesitancy or incontinence. ?Denies headaches, seizures, numbness, or tingling. ?Denies depression, anxiety or insomnia. ? ? ? ?PE ? ?BP 138/76 (BP Location: Right Arm, Cuff Size: Large)   Pulse 70   Ht _0  (1.626 m)   Wt 197 lb (89.4  kg)   SpO2 98%   BMI 33.81 kg/m?  ? ?Patient alert and oriented and in no cardiopulmonary distress. ? ?HEENT: No facial asymmetry, EOMI,     Neck supple .  Bilateral tympanic membranes appears normal, no drainage or redness noted.  ? ?Chest: Clear to auscultation bilaterally. ? ?CVS: S1, S2 no murmurs, no S3.Regular rate. ? ?ABD: Soft non tender.  ? ?Ext: No edema ? ?Psych: Good eye contact, normal affect. Memory intact not anxious or depressed appearing. ? ?CNS: CN 2-12 intact, power,  normal throughout.no focal deficits noted. ? ? ?Assessment & Plan ? ?Hyperlipidemia associated with type 2 diabetes mellitus ([facility]) [place] Results  ?Component Value Date  ? CHOL 175 07/16/2021  ? HDL 55 07/16/2021  ? LDLCAG[place]ber[place]4/14/2023  ? TRIG 235 (H) 07/16/2021  ? CHOLHDL 3.2 07/16/2021  ?LDL not at goal of less than 70 ?start ezetimbe 10mg 9my, continue Crestor 40 mg daily ?Check lipid panel in 6 weeks. ?Avoid fried fatty foods ? ?Essential hypertension, benign ?BP Readings from Last 3 Encounters:  ?07/20/21 138/76  ?07/05/21 130/80  ?05/28/21 132/78  ?She has been taking losartan 50 mg daily, hydrochlorothiazide 25 mg daily, metoprolol 100 mg daily, nifedipine 90 mg daily.  ?Patient told to stop taking hydrochlorothiazide 25 mg daily due to decreased kidney function and elevated calcium. ?Start hydralazine 25 mg twice daily, continue ordered blood pressure medication. ?Patient told to monitor BP at home. ?DASH diet advised, engage in regular exercises at least 150 minutes weekly ?Follow-up in 6 weeks, recheck CMP  plus EGFR in 6 weeks.  ? ?Controlled diabetes mellitus type 2 with complications ([place]) ?Lab Results  ?Component Value Date  ? HGBA1C 6.4 (H) 07/16/2021  ?Chronic condition well-controlled ?Continue Farxiga 10 mg daily ?Diabetes foot exam completed today ?Avoid sugar sweets soda ?Engage in regular daily exercises. ?Check urine creatinine lab ?Has upcoming eye exam.  ? ?Stage 3b chronic kidney disease ([place]) ?Lab Results   ?Component Value Date  ? NA 143 07/16/2021  ? K 4.3 07/16/2021  ? CO2 29 07/16/2021  ? GLUCOSE 108 (H) 07/16/2021  ? BUN 25 07/16/2021  ? CREATININE 1.65 (H) 07/16/2021  ? CALCIUM 11.2 (H) 07/16/2021  ? EGFR 33 (L) 07/16/2021  ? GFRNONAA 57 (L) 06/26/2020  ?EGFR of 33, history of renal cancer ?On losartan 50 mg daily,, Farxiga 10 mg daily ?Patient told to stop taking hydrochlorothiazide, hydrochlorothiazide replaced with hydralazine today. ?Avoid Aleve ibuprofen and other nephrotoxic agents, drink at least 64 ounces of water daily ?Recheck labs in 6 weeks and  refered to nephrology ? ?Tinnitus ?Denies spinning sensation, dizziness, this does not suggest vertigo ?Will refer to ENT for further work-up ? ?Hypercalcemia ?Lab Results  ?Component Value Date  ? CALCIUM 11.2 (H) 07/16/2021  ?States that she takes calcium supplement at home ?Patient told to stop calcium supplement, stop hydrochlorothiazide. ?We will recheck labs in 6 weeks.   ?

## 2021-07-20 NOTE — Assessment & Plan Note (Signed)
Lab Results  ?Component Value Date  ? CALCIUM 11.2 (H) 07/16/2021  ?States that she takes calcium supplement at home ?Patient told to stop calcium supplement, stop hydrochlorothiazide. ?We will recheck labs in 6 weeks.  ?

## 2021-07-20 NOTE — Assessment & Plan Note (Signed)
Denies spinning sensation, dizziness, this does not suggest vertigo ?Will refer to ENT for further work-up ?

## 2021-07-20 NOTE — Assessment & Plan Note (Addendum)
Lab Results  ?Component Value Date  ? NA 143 07/16/2021  ? K 4.3 07/16/2021  ? CO2 29 07/16/2021  ? GLUCOSE 108 (H) 07/16/2021  ? BUN 25 07/16/2021  ? CREATININE 1.65 (H) 07/16/2021  ? CALCIUM 11.2 (H) 07/16/2021  ? EGFR 33 (L) 07/16/2021  ? GFRNONAA 57 (L) 06/26/2020  ?EGFR of 33, history of renal cancer ?On losartan 50 mg daily,, Farxiga 10 mg daily ?Patient told to stop taking hydrochlorothiazide, hydrochlorothiazide replaced with hydralazine today. ?Avoid Aleve ibuprofen and other nephrotoxic agents, drink at least 64 ounces of water daily ?Recheck labs in 6 weeks and  refered to nephrology ?

## 2021-07-20 NOTE — Assessment & Plan Note (Addendum)
Lab Results  ?Component Value Date  ? HGBA1C 6.4 (H) 07/16/2021  ?Chronic condition well-controlled ?Continue Farxiga 10 mg daily ?Diabetes foot exam completed today ?Avoid sugar sweets soda ?Engage in regular daily exercises. ?Check urine creatinine lab ?Has upcoming eye exam.  ?

## 2021-07-20 NOTE — Assessment & Plan Note (Addendum)
Lab Results  ?Component Value Date  ? CHOL 175 07/16/2021  ? HDL 55 07/16/2021  ? [place] 81 07/16/2021  ? TRIG 235 (H) 07/16/2021  ? CHOLHDL 3.2 07/16/2021  ?LDL not at goal of less than 70 ?start ezetimbe '10mg'$  daily, continue Crestor 40 mg daily ?Check lipid panel in 6 weeks. ?Avoid fried fatty foods ?

## 2021-08-16 ENCOUNTER — Ambulatory Visit: Payer: Medicare Other | Admitting: Internal Medicine

## 2021-09-08 ENCOUNTER — Ambulatory Visit: Payer: Medicare Other | Admitting: Nurse Practitioner

## 2021-09-10 ENCOUNTER — Ambulatory Visit: Payer: Medicare Other | Admitting: Nurse Practitioner

## 2021-09-21 ENCOUNTER — Ambulatory Visit: Payer: Medicare Other | Admitting: Internal Medicine

## 2021-10-03 NOTE — Progress Notes (Deleted)
Lindsey Buckley, female    DOB: 1951/02/27,    MRN: [id number]   Brief patient profile:  71  yobf  light smoker quit smoking ? 2001 with onset of asthma w/in a few years of stopping was admitted with asthma to [facility]  referred to pulmonary clinic in [place]  05/28/2021 by Varney Baas NP/Dr Moshe Cipro  for asthma  on prn saba since 2010      History of Present Illness  05/28/2021  Pulmonary/ 1st office eval/ Lindsey Buckley / [place] [facility] cough  x  4 months  Chief Complaint  Patient presents with   Consult    REF FOR ASTHMA    Dyspnea:  limited L ankle more than breathing @ relatively  slow pace - does not do steps(no need) Cough: after supper worse/ variable mucus white/ on ppi bid but not ac and prior h/o ace cough  Sleep: on R side/ bed is flat / 3 pillows nightly cough/ wheeze  SABA use: avg one or two a day - helps the cough after a few years  Prednisone helped cough transiently   Rec. Plan A = Automatic = Always=    Symbicort 80 Take 2 puffs first thing in am and then another 2 puffs about 12 hours later.  Work on inhaler technique:   Plan B = Backup (to supplement plan A, not to replace it) Only use your albuterol inhaler as a rescue medication  Plan C = Crisis (instead of Plan B but only if Plan B stops working) - only use your albuterol nebulizer if you first try Plan B and it fails to help Prednisone 10 mg take  4 each am x 2 days,   2 each am x 2 days,  1 each am x 2 days and stop  Continue prilosec 20 mg Take 30- 60 min before your first and last meals of the day  GERD diet reviewed, bed blocks rec  Please schedule a follow up office visit in 4 weeks, sooner if needed  with all medications /inhalers/ solutions in hand     Allergy screen 05/28/21  >  Eos 0.0 /  IgE  1418   07/05/2021  f/u ov/[facility]/Lindsey Buckley no meds but brought inhalers re: allergic asthma maint on symbicort  though hfa very poor  Chief Complaint  Patient presents with   Follow-up     Patient is doing good, no concerns.    Dyspnea:  ankle is getting better / walking around house and some outdoors now  Cough: minimal after prednisone  Sleeping: better flat bed/ 3 pillows no noct cc  SABA use: varies with ex/ outdoors 02: no  Covid status: vax x 2  Rec Plan A = Automatic = Always=    Symbicort 80 Take 2 puffs first thing in am and then another 2 puffs about 12 hours later and monteklukast 10 mg every evening  Work on inhaler technique:  Plan B = Backup (to supplement plan A, not to replace it) Only use your albuterol inhaler as a rescue medication  Please schedule a follow up office visit in 6 weeks, call sooner if needed with all medications /inhalers/ solutions in hand    10/04/2021  f/u ov/Lindsey Buckley re: ***   maint on ***  No chief complaint on file.   Dyspnea:  *** Cough: *** Sleeping: *** SABA use: *** 02: *** Covid status:   ***   No obvious day to day or daytime variability or assoc excess/ purulent sputum  or mucus plugs or hemoptysis or cp or chest tightness, subjective wheeze or overt sinus or hb symptoms.   *** without nocturnal  or early am exacerbation  of respiratory  c/o's or need for noct saba. Also denies any obvious fluctuation of symptoms with weather or environmental changes or other aggravating or alleviating factors except as outlined above   No unusual exposure hx or h/o childhood pna/ asthma or knowledge of premature birth.  Current Allergies, Complete Past Medical History, Past Surgical History, Family History, and Social History were reviewed in [employer] record.  ROS  The following are not active complaints unless bolded Hoarseness, sore throat, dysphagia, dental problems, itching, sneezing,  nasal congestion or discharge of excess mucus or purulent secretions, ear ache,   fever, chills, sweats, unintended wt loss or wt gain, classically pleuritic or exertional cp,  orthopnea pnd or arm/hand swelling  or leg swelling,  presyncope, palpitations, abdominal pain, anorexia, nausea, vomiting, diarrhea  or change in bowel habits or change in bladder habits, change in stools or change in urine, dysuria, hematuria,  rash, arthralgias, visual complaints, headache, numbness, weakness or ataxia or problems with walking or coordination,  change in mood or  memory.        No outpatient medications have been marked as taking for the 10/04/21 encounter (Appointment) with Tanda Rockers, MD.             Past Medical History:  Diagnosis Date   Asthma 2007   smoke trigged   Chest pain    history of   Diabetes mellitus without complication ([place]) 9179   borderline diabetes per pt per MD    Dysrhythmia 2003   Eczema    GERD (gastroesophageal reflux disease)    Hyperlipidemia    Other and unspecified hyperlipidemia    Pulmonary congestion and hypostasis 2003   Pulmonary edema 2003   Unspecified essential hypertension        Objective:      10/04/2021          ***  07/05/21 204 lb (92.5 kg)  05/28/21 197 lb 12.8 oz (89.7 kg)  04/09/21 197 lb (89.4 kg)      Vital signs reviewed  10/04/2021  - Note at rest 02 sats  ***% on ***   General appearance:    ***               Assessment

## 2021-10-04 ENCOUNTER — Ambulatory Visit: Payer: Medicare Other | Admitting: Internal Medicine

## 2021-10-24 ENCOUNTER — Other Ambulatory Visit: Payer: Self-pay | Admitting: Nurse Practitioner

## 2021-10-24 DIAGNOSIS — I10 Essential (primary) hypertension: Secondary | ICD-10-CM

## 2021-11-09 ENCOUNTER — Other Ambulatory Visit: Payer: Self-pay | Admitting: Nurse Practitioner

## 2021-11-09 DIAGNOSIS — E785 Hyperlipidemia, unspecified: Secondary | ICD-10-CM

## 2021-11-09 MED ORDER — ROSUVASTATIN CALCIUM 40 MG PO TABS: 40.0000 mg | ORAL_TABLET | Freq: Every day | ORAL | 3 refills | Status: DC

## 2021-12-22 LAB — HM DIABETES EYE EXAM

## 2021-12-28 ENCOUNTER — Ambulatory Visit (INDEPENDENT_AMBULATORY_CARE_PROVIDER_SITE_OTHER): Payer: Medicare Other | Admitting: Internal Medicine

## 2021-12-28 ENCOUNTER — Encounter: Payer: Self-pay | Admitting: Internal Medicine

## 2021-12-28 DIAGNOSIS — E118 Type 2 diabetes mellitus with unspecified complications: Secondary | ICD-10-CM

## 2021-12-28 DIAGNOSIS — Z0001 Encounter for general adult medical examination with abnormal findings: Secondary | ICD-10-CM

## 2021-12-28 DIAGNOSIS — E1165 Type 2 diabetes mellitus with hyperglycemia: Secondary | ICD-10-CM

## 2021-12-28 DIAGNOSIS — N1832 Chronic kidney disease, stage 3b: Secondary | ICD-10-CM

## 2021-12-28 DIAGNOSIS — J4520 Mild intermittent asthma, uncomplicated: Secondary | ICD-10-CM

## 2021-12-28 DIAGNOSIS — E785 Hyperlipidemia, unspecified: Secondary | ICD-10-CM | POA: Diagnosis not present

## 2021-12-28 DIAGNOSIS — Z0000 Encounter for general adult medical examination without abnormal findings: Secondary | ICD-10-CM

## 2021-12-28 DIAGNOSIS — E1169 Type 2 diabetes mellitus with other specified complication: Secondary | ICD-10-CM

## 2021-12-28 DIAGNOSIS — Z2821 Immunization not carried out because of patient refusal: Secondary | ICD-10-CM

## 2021-12-28 DIAGNOSIS — I10 Essential (primary) hypertension: Secondary | ICD-10-CM | POA: Diagnosis not present

## 2021-12-28 MED ORDER — NIFEDIPINE ER OSMOTIC RELEASE 90 MG PO TB24: 90.0000 mg | ORAL_TABLET | Freq: Every day | ORAL | 3 refills | Status: DC

## 2021-12-28 MED ORDER — LOSARTAN POTASSIUM 50 MG PO TABS: 50.0000 mg | ORAL_TABLET | Freq: Every day | ORAL | 3 refills | Status: DC

## 2021-12-28 MED ORDER — ROSUVASTATIN CALCIUM 40 MG PO TABS: 40.0000 mg | ORAL_TABLET | Freq: Every day | ORAL | 3 refills | Status: DC

## 2021-12-28 MED ORDER — FARXIGA 10 MG PO TABS: 10.0000 mg | ORAL_TABLET | Freq: Every day | ORAL | 3 refills | Status: DC

## 2021-12-28 MED ORDER — METOPROLOL SUCCINATE ER 100 MG PO TB24: 100.0000 mg | ORAL_TABLET | Freq: Two times a day (BID) | ORAL | 3 refills | Status: DC

## 2021-12-28 NOTE — Patient Instructions (Signed)
It was a pleasure to see you today.  Thank you for giving [place] the opportunity to be involved in your care.  Below is a brief recap of your visit and next steps.  We will plan to see you again in 1 month  Summary I have refilled your medications today and placed a referral to nephrology (kidney specialist) We will also check your calcium level  Next steps Follow up in 1 month

## 2021-12-28 NOTE — Assessment & Plan Note (Signed)
HbA1c 6.4 in April.  She is currently prescribed Farxiga 10 mg daily.  Denies polyuria/polydipsia.  No changes today.

## 2021-12-28 NOTE — Assessment & Plan Note (Signed)
Currently prescribed Crestor 40 mg daily.  Lipid panel updated in April.  No changes today.

## 2021-12-28 NOTE — Assessment & Plan Note (Signed)
Her renal function has significantly declined over the last year.  Prior documentation states she was previously referred to nephrology, but states that she has not establish care.  I placed a new referral today.  Repeat BMP ordered today.  She declined uACR today but states this can be done at her follow-up appointment next month.

## 2021-12-28 NOTE — Assessment & Plan Note (Signed)
BP elevated today.  Patient states that she is not taking her medications yet.  She requests refills today.  Her current regimen consists of losartan 50 mg daily, metoprolol succinate 100 mg twice daily, and Procardia XL 90 mg daily. -Refills provided -Follow-up in 1 month for BP check

## 2021-12-28 NOTE — Assessment & Plan Note (Signed)
Symptoms well controlled with Symbicort and albuterol as needed.  Asymptomatic today.  Unremarkable pulmonary exam.

## 2021-12-28 NOTE — Assessment & Plan Note (Signed)
Presenting today for routine follow-up -She declined influenza and Tdap vaccines today -Declined uACR today but states she will complete this at follow-up in 1 month -Repeat labs in 1 month

## 2021-12-28 NOTE — Assessment & Plan Note (Signed)
Calcium corrected for albumin 10.6 on labs from April.  HCTZ and calcium supplementation were discontinued.  Repeat BMP ordered today.

## 2021-12-28 NOTE — Progress Notes (Signed)
Established Patient Office Visit  Subjective   Patient ID: Lindsey Buckley, female    DOB: [birth year]  Age: 71 y.o. MRN: [id number]  Chief Complaint  Patient presents with   Follow-up    [name] is a 71 year old woman returning to care today.  She has a past medical history significant for T2DM, HLD, HTN, CKD 3B, and asthma.  Last seen at [facility] by Vena Rua, NP on 07/20/2021.  HCTZ and calcium supplementation stopped at that time due to hypercalcemia.  In the interim, [name] has followed up with her ophthalmologist.  She states that she is currently finishing a course of prednisone for issues with her right eye.  [name] otherwise requests medication refills.  She denies additional concerns and is asymptomatic.  Acute concerns, chronic medical conditions, and outstanding preventative healthcare maintenance items discussed today are individually addressed in A/P below.  Past Medical History:  Diagnosis Date   Asthma 2007   smoke trigged   Chest pain    history of   Diabetes mellitus without complication ([place]) 0938   borderline diabetes per pt per MD    Dysrhythmia 2003   Eczema    GERD (gastroesophageal reflux disease)    Hyperlipidemia    Other and unspecified hyperlipidemia    Pulmonary congestion and hypostasis 2003   Pulmonary edema 2003   Unspecified essential hypertension    Past Surgical History:  Procedure Laterality Date   ABDOMINAL HYSTERECTOMY     partial   COLONOSCOPY     remote past in [place]   COLONOSCOPY WITH PROPOFOL N/A 04/07/2020   non-bleeding internal hemorrhoids, multiple diverticula in entire colon, multiple polyps. Tubular adenomas. Surveillance in 5 years.    POLYPECTOMY  04/07/2020   Procedure: POLYPECTOMY;  Surgeon: [name], DO;  [facility];  Service: Endoscopy;;   ROBOTIC ASSITED PARTIAL NEPHRECTOMY Right 06/25/2020   Procedure: XI ROBOTIC ASSITED PARTIAL NEPHRECTOMY;  Surgeon: Cleon Gustin, MD;   Location: [facility];  Service: Urology;  Laterality: Right;   TUBAL LIGATION     Social History   Tobacco Use   Smoking status: Former    Packs/day: 0.40    Types: Cigarettes    Quit date: 12/11/1999    Years since quitting: 22.0   Smokeless tobacco: Never  Vaping Use   Vaping Use: Never used  Substance Use Topics   Alcohol use: No   Drug use: No   Family History  Problem Relation Age of Onset   Uterine cancer Sister    Colon cancer Neg Hx    Colon polyps Neg Hx    Allergies  Allergen Reactions   Ace Inhibitors Cough   Lisinopril Cough   Review of Systems  Constitutional:  Negative for chills and fever.  HENT:  Negative for sore throat.   Eyes:        'floaters'  Respiratory:  Negative for cough and shortness of breath.   Cardiovascular:  Negative for chest pain, palpitations and leg swelling.  Gastrointestinal:  Negative for abdominal pain, blood in stool, constipation, diarrhea, nausea and vomiting.  Genitourinary:  Negative for dysuria and hematuria.  Musculoskeletal:  Negative for myalgias.  Skin:  Negative for itching and rash.  Neurological:  Negative for dizziness and headaches.  Psychiatric/Behavioral:  Negative for depression and suicidal ideas.      Objective:     BP (!) 158/80 (BP Location: Right Arm, Cuff Size: Large)   Pulse 69   Ht 5' 4"  (  1.626 m)   Wt 193 lb 12.8 oz (87.9 kg)   SpO2 97%   BMI 33.27 kg/m  BP Readings from Last 3 Encounters:  12/28/21 (!) 158/80  07/20/21 138/76  07/05/21 130/80   Physical Exam Constitutional:      General: She is not in acute distress.    Appearance: Normal appearance. She is obese. She is not toxic-appearing.  HENT:     Head: Normocephalic and atraumatic.     Right Ear: External ear normal.     Left Ear: External ear normal.     Nose: Nose normal. No congestion or rhinorrhea.     Mouth/Throat:     Mouth: Mucous membranes are moist.     Pharynx: Oropharynx is clear. No oropharyngeal exudate or posterior  oropharyngeal erythema.  Eyes:     General: No scleral icterus.    Extraocular Movements: Extraocular movements intact.     Pupils: Pupils are equal, round, and reactive to light.  Cardiovascular:     Rate and Rhythm: Normal rate and regular rhythm.     Pulses: Normal pulses.     Heart sounds: No murmur heard.    No friction rub. No gallop.  Pulmonary:     Effort: Pulmonary effort is normal.     Breath sounds: Normal breath sounds. No wheezing, rhonchi or rales.  Abdominal:     General: Abdomen is flat. Bowel sounds are normal. There is no distension.     Palpations: Abdomen is soft.     Tenderness: There is no abdominal tenderness.  Musculoskeletal:        General: No swelling. Normal range of motion.     Cervical back: Normal range of motion.     Right lower leg: No edema.     Left lower leg: No edema.  Skin:    General: Skin is warm and dry.     Capillary Refill: Capillary refill takes less than 2 seconds.     Coloration: Skin is not jaundiced.  Neurological:     General: No focal deficit present.     Mental Status: She is alert and oriented to person, place, and time.  Psychiatric:        Mood and Affect: Mood normal.        Behavior: Behavior normal.    Last CBC Lab Results  Component Value Date   WBC 13.2 (H) 05/31/2021   HGB 13.2 05/31/2021   HCT 39.5 05/31/2021   MCV 87 05/31/2021   MCH 29.0 05/31/2021   RDW 12.5 05/31/2021   PLT 332 16/01/9603   Last metabolic panel Lab Results  Component Value Date   GLUCOSE 108 (H) 07/16/2021   NA 143 07/16/2021   K 4.3 07/16/2021   CL 98 07/16/2021   CO2 29 07/16/2021   BUN 25 07/16/2021   CREATININE 1.65 (H) 07/16/2021   EGFR 33 (L) 07/16/2021   CALCIUM 11.2 (H) 07/16/2021   PROT 7.7 07/16/2021   ALBUMIN 4.7 07/16/2021   LABGLOB 3.0 07/16/2021   AGRATIO 1.6 07/16/2021   BILITOT 0.7 07/16/2021   ALKPHOS 71 07/16/2021   AST 30 07/16/2021   ALT 26 07/16/2021   ANIONGAP 9 06/26/2020   Last lipids Lab  Results  Component Value Date   CHOL 175 07/16/2021   HDL 55 07/16/2021   LDLCALC 81 07/16/2021   TRIG 235 (H) 07/16/2021   CHOLHDL 3.2 07/16/2021   Last hemoglobin A1c Lab Results  Component Value Date   HGBA1C 6.4 (H) 07/16/2021  Last thyroid functions Lab Results  Component Value Date   TSH 1.886 06/13/2009   The 10-year ASCVD risk score (Arnett DK, et al., 2019) is: 33.5%    Assessment & Plan:   Problem List Items Addressed This Visit       Cardiovascular and Mediastinum   Essential hypertension, benign    BP elevated today.  Patient states that she is not taking her medications yet.  She requests refills today.  Her current regimen consists of losartan 50 mg daily, metoprolol succinate 100 mg twice daily, and Procardia XL 90 mg daily. -Refills provided -Follow-up in 1 month for BP check        Respiratory   Mild intermittent asthma without complication    Symptoms well controlled with Symbicort and albuterol as needed.  Asymptomatic today.  Unremarkable pulmonary exam.        Endocrine   Hyperlipidemia associated with type 2 diabetes mellitus ([place])    Currently prescribed Crestor 40 mg daily.  Lipid panel updated in April.  No changes today.      Controlled diabetes mellitus type 2 with complications ([place])    XYD2W 6.4 in April.  She is currently prescribed Farxiga 10 mg daily.  Denies polyuria/polydipsia.  No changes today.        Genitourinary   Stage 3b chronic kidney disease ([place])    Her renal function has significantly declined over the last year.  Prior documentation states she was previously referred to nephrology, but states that she has not establish care.  I placed a new referral today.  Repeat BMP ordered today.  She declined uACR today but states this can be done at her follow-up appointment next month.        Other   Hypercalcemia - Primary    Calcium corrected for albumin 10.6 on labs from April.  HCTZ and calcium supplementation were  discontinued.  Repeat BMP ordered today.      Preventative health care    Presenting today for routine follow-up -She declined influenza and Tdap vaccines today -Declined uACR today but states she will complete this at follow-up in 1 month -Repeat labs in 1 month      Return in about 4 weeks (around 01/25/2022).    Johnette Abraham, MD

## 2021-12-29 LAB — BASIC METABOLIC PANEL
BUN/Creatinine Ratio: 16 (ref 12–28)
BUN: 20 mg/dL (ref 8–27)
CO2: 23 mmol/L (ref 20–29)
Calcium: 9.4 mg/dL (ref 8.7–10.3)
Chloride: 105 mmol/L (ref 96–106)
Creatinine, Ser: 1.23 mg/dL — ABNORMAL HIGH (ref 0.57–1.00)
Glucose: 139 mg/dL — ABNORMAL HIGH (ref 70–99)
Potassium: 4.5 mmol/L (ref 3.5–5.2)
Sodium: 142 mmol/L (ref 134–144)
eGFR: 47 mL/min/{1.73_m2} — ABNORMAL LOW (ref >59)

## 2022-01-16 IMAGING — DX DG CHEST 1V
1 series · 1 of 1 positions shown · non-contrast
Comparison: 12/31/2014

CLINICAL DATA: 70-year-old female with right-sided RCC, COVID
positive

EXAM:
CHEST  1 VIEW

[chest pa]
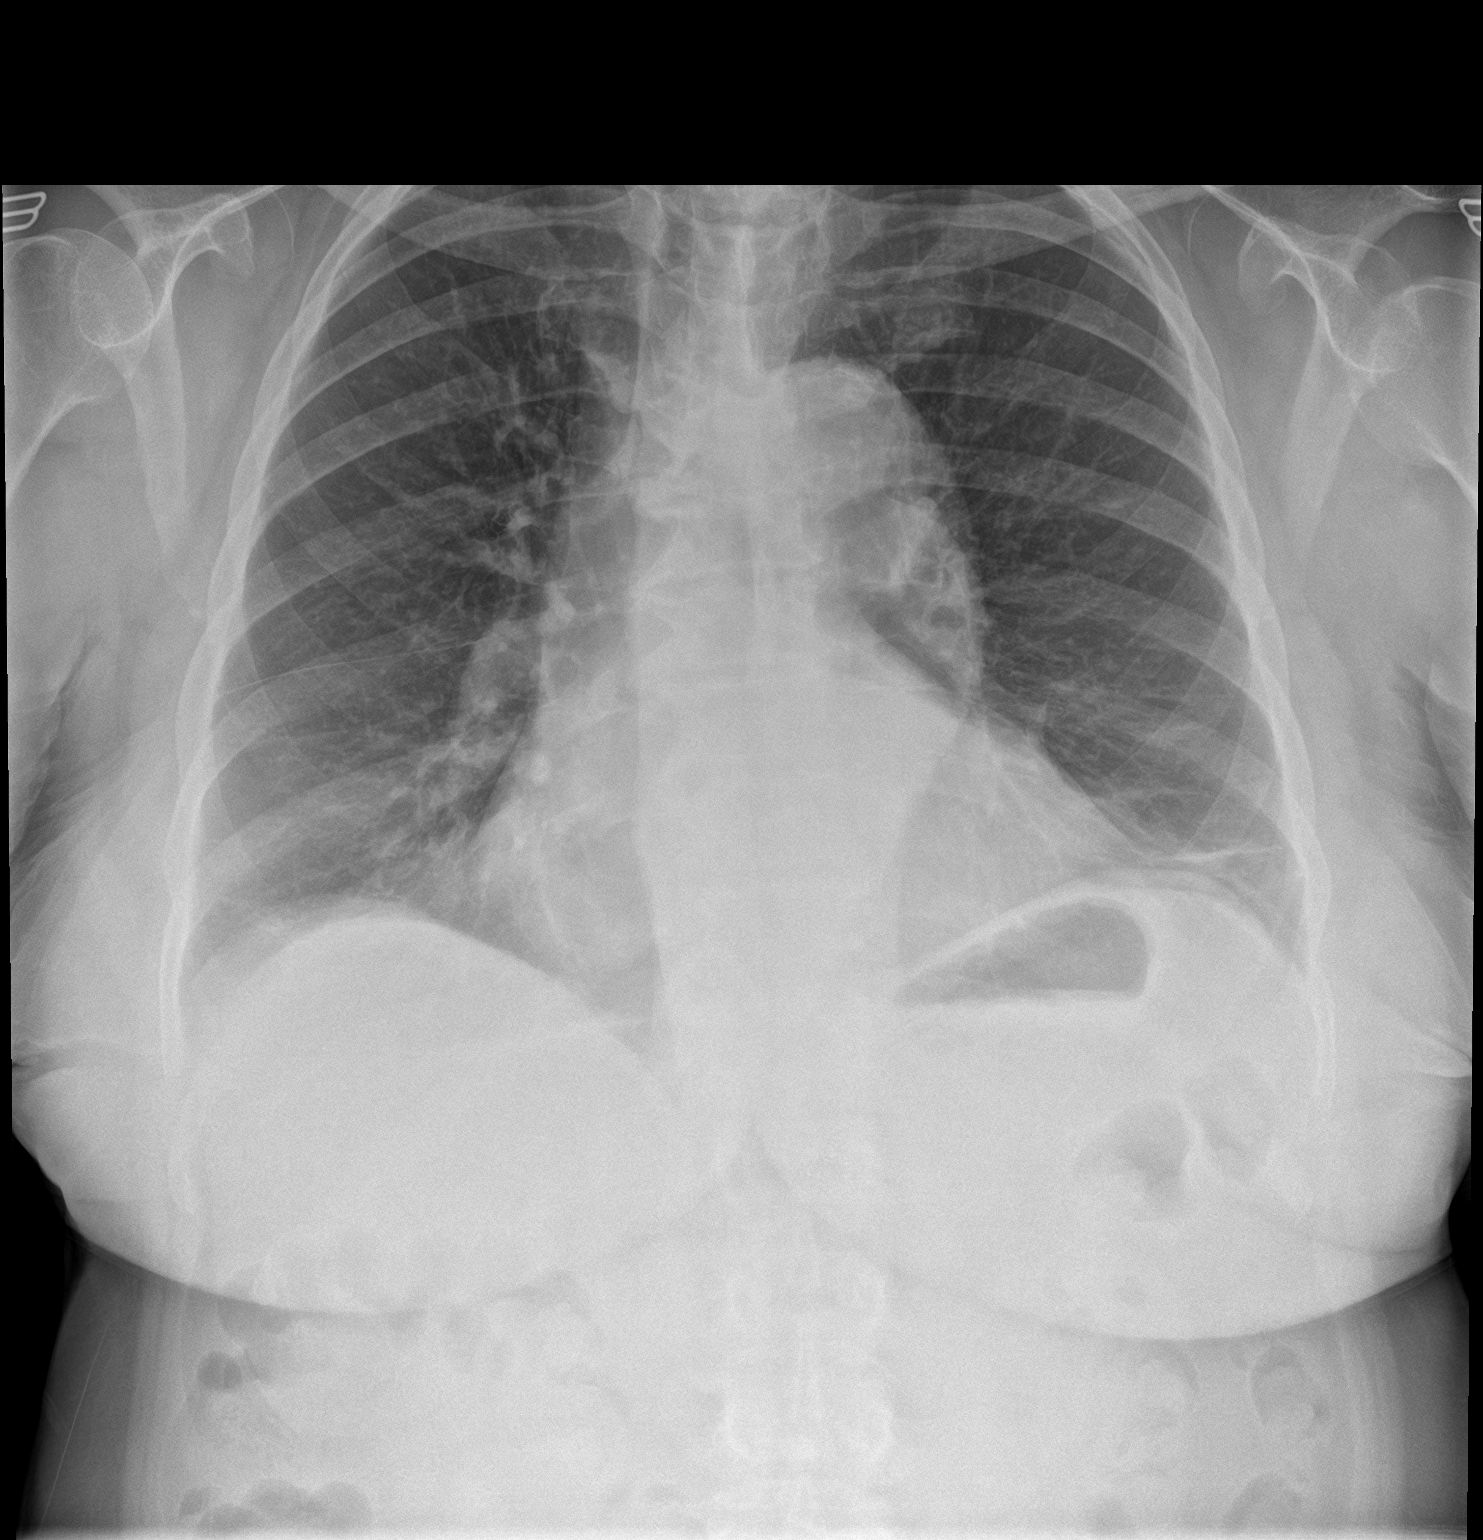

[1 of 1 positions shown; findings below may reference images not displayed]

FINDINGS: Cardiomediastinal silhouette unchanged in size and contour. No
evidence of central vascular congestion. No interlobular septal
thickening.

Low lung volumes with linear opacities at the bases, with increased
mild reticulonodular opacities. No confluent airspace disease.

No pneumothorax or pleural effusion.

No acute displaced fracture. Degenerative changes of the spine.
IMPRESSION: Low lung volumes with mild reticulonodular opacities at the bases,
potentially atelectasis/scarring or alternatively sequela of
atypical infection

## 2022-01-25 ENCOUNTER — Ambulatory Visit (INDEPENDENT_AMBULATORY_CARE_PROVIDER_SITE_OTHER): Payer: Medicare Other | Admitting: Internal Medicine

## 2022-01-25 ENCOUNTER — Encounter: Payer: Self-pay | Admitting: Internal Medicine

## 2022-01-25 VITALS — BP 159/79 | HR 73 | Ht 64.0 in | Wt 192.2 lb

## 2022-01-25 DIAGNOSIS — E118 Type 2 diabetes mellitus with unspecified complications: Secondary | ICD-10-CM

## 2022-01-25 DIAGNOSIS — L309 Dermatitis, unspecified: Secondary | ICD-10-CM | POA: Diagnosis not present

## 2022-01-25 DIAGNOSIS — I10 Essential (primary) hypertension: Secondary | ICD-10-CM

## 2022-01-25 DIAGNOSIS — N1832 Chronic kidney disease, stage 3b: Secondary | ICD-10-CM | POA: Diagnosis not present

## 2022-01-25 MED ORDER — LOSARTAN POTASSIUM 100 MG PO TABS: 100.0000 mg | ORAL_TABLET | Freq: Every day | ORAL | 1 refills | Status: DC

## 2022-01-25 MED ORDER — BETAMETHASONE DIPROPIONATE 0.05 % EX CREA: TOPICAL_CREAM | Freq: Two times a day (BID) | CUTANEOUS | 0 refills | Status: DC | PRN

## 2022-01-25 NOTE — Assessment & Plan Note (Signed)
Repeat A1c and urine microalbumin/creatinine ratio ordered today.

## 2022-01-25 NOTE — Assessment & Plan Note (Signed)
Presenting today for BP check.  Her blood pressure remains elevated today, 159/79.  Her current regimen consist of losartan 50 mg daily, Procardia XL 90 mg daily, and metoprolol succinate 100 mg twice daily. -Increase losartan to 100 mg daily -Continue Procardia XL and metoprolol at current doses -Follow-up in 4 weeks for BP check and repeat BMP

## 2022-01-25 NOTE — Patient Instructions (Signed)
It was a pleasure to see you today.  Thank you for giving [place] the opportunity to be involved in your care.  Below is a brief recap of your visit and next steps.  We will plan to see you again in 4 weeks.  Summary We have increased losartan to 100 mg daily today I have refilled the steroid cream for eczema Follow up in 4 weeks for blood pressure check

## 2022-01-25 NOTE — Progress Notes (Signed)
Established Patient Office Visit  Subjective   Patient ID: Lindsey Buckley, female    DOB: [birth year]  Age: 71 y.o. MRN: [id number]  Chief Complaint  Patient presents with   Follow-up   Lindsey Buckley returns to care today.  She is a 71 year old woman with a past medical history significant for T2DM, HLD, HTN, CKD 3B, and asthma.  She was last seen by me on 9/26 at which time her blood pressure was elevated.  She reported not taking her medications prior to her appointment.  1 month follow-up was arranged for BP check.  There have been no acute interval events.  Today Lindsey Buckley is feeling well.  She has no acute concerns but requests a refill of steroid cream for treatment of eczema, which has flared with colder temperatures.  She is otherwise asymptomatic.  Past Medical History:  Diagnosis Date   Asthma 2007   smoke trigged   Chest pain    history of   Diabetes mellitus without complication ([place]) 8185   borderline diabetes per pt per MD    Dysrhythmia 2003   Eczema    GERD (gastroesophageal reflux disease)    Hyperlipidemia    Other and unspecified hyperlipidemia    Pulmonary congestion and hypostasis 2003   Pulmonary edema 2003   Unspecified essential hypertension    Past Surgical History:  Procedure Laterality Date   ABDOMINAL HYSTERECTOMY     partial   COLONOSCOPY     remote past in [place]   COLONOSCOPY WITH PROPOFOL N/A 04/07/2020   non-bleeding internal hemorrhoids, multiple diverticula in entire colon, multiple polyps. Tubular adenomas. Surveillance in 5 years.    POLYPECTOMY  04/07/2020   Procedure: POLYPECTOMY;  Surgeon: [name], DO;  [facility];  Service: Endoscopy;;   ROBOTIC ASSITED PARTIAL NEPHRECTOMY Right 06/25/2020   Procedure: XI ROBOTIC ASSITED PARTIAL NEPHRECTOMY;  Surgeon: Cleon Gustin, MD;  Location: [facility];  Service: Urology;  Laterality: Right;   TUBAL LIGATION     Social History   Tobacco Use   Smoking status:  Former    Packs/day: 0.40    Types: Cigarettes    Quit date: 12/11/1999    Years since quitting: 22.1   Smokeless tobacco: Never  Vaping Use   Vaping Use: Never used  Substance Use Topics   Alcohol use: No   Drug use: No   Family History  Problem Relation Age of Onset   Uterine cancer Sister    Colon cancer Neg Hx    Colon polyps Neg Hx    Allergies  Allergen Reactions   Ace Inhibitors Cough   Lisinopril Cough   Review of Systems  Constitutional:  Negative for chills and fever.  HENT:  Negative for sore throat.   Respiratory:  Negative for cough and shortness of breath.   Cardiovascular:  Negative for chest pain, palpitations and leg swelling.  Gastrointestinal:  Negative for abdominal pain, blood in stool, constipation, diarrhea, nausea and vomiting.  Genitourinary:  Negative for dysuria and hematuria.  Musculoskeletal:  Negative for myalgias.  Skin:  Negative for itching and rash (Eczema).  Neurological:  Negative for dizziness and headaches.  Psychiatric/Behavioral:  Negative for depression and suicidal ideas.       Objective:     BP (!) 159/79   Pulse 73   Ht 5' 4"  (1.626 m)   Wt 192 lb 3.2 oz (87.2 kg)   SpO2 96%   BMI 32.99 kg/m  BP Readings from  Last 3 Encounters:  01/25/22 (!) 159/79  12/28/21 (!) 158/80  07/20/21 138/76      Physical Exam Constitutional:      General: She is not in acute distress.    Appearance: Normal appearance. She is obese. She is not toxic-appearing.  HENT:     Head: Normocephalic and atraumatic.     Right Ear: External ear normal.     Left Ear: External ear normal.     Nose: Nose normal. No congestion or rhinorrhea.     Mouth/Throat:     Mouth: Mucous membranes are moist.     Pharynx: Oropharynx is clear. No oropharyngeal exudate or posterior oropharyngeal erythema.  Eyes:     General: No scleral icterus.    Extraocular Movements: Extraocular movements intact.     Pupils: Pupils are equal, round, and reactive to light.   Cardiovascular:     Rate and Rhythm: Normal rate and regular rhythm.     Pulses: Normal pulses.     Heart sounds: No murmur heard.    No friction rub. No gallop.  Pulmonary:     Effort: Pulmonary effort is normal.     Breath sounds: Normal breath sounds. No wheezing, rhonchi or rales.  Abdominal:     General: Abdomen is flat. Bowel sounds are normal. There is no distension.     Palpations: Abdomen is soft.     Tenderness: There is no abdominal tenderness.  Musculoskeletal:        General: No swelling. Normal range of motion.     Cervical back: Normal range of motion.     Right lower leg: No edema.     Left lower leg: No edema.  Skin:    General: Skin is warm and dry.     Capillary Refill: Capillary refill takes less than 2 seconds.     Coloration: Skin is not jaundiced.     Findings: Rash (Eczematous rash on the left forearm) present.  Neurological:     General: No focal deficit present.     Mental Status: She is alert and oriented to person, place, and time.  Psychiatric:        Mood and Affect: Mood normal.        Behavior: Behavior normal.    Last CBC Lab Results  Component Value Date   WBC 13.2 (H) 05/31/2021   HGB 13.2 05/31/2021   HCT 39.5 05/31/2021   MCV 87 05/31/2021   MCH 29.0 05/31/2021   RDW 12.5 05/31/2021   PLT 332 80/88/1103   Last metabolic panel Lab Results  Component Value Date   GLUCOSE 139 (H) 12/28/2021   NA 142 12/28/2021   K 4.5 12/28/2021   CL 105 12/28/2021   CO2 23 12/28/2021   BUN 20 12/28/2021   CREATININE 1.23 (H) 12/28/2021   EGFR 47 (L) 12/28/2021   CALCIUM 9.4 12/28/2021   PROT 7.7 07/16/2021   ALBUMIN 4.7 07/16/2021   LABGLOB 3.0 07/16/2021   AGRATIO 1.6 07/16/2021   BILITOT 0.7 07/16/2021   ALKPHOS 71 07/16/2021   AST 30 07/16/2021   ALT 26 07/16/2021   ANIONGAP 9 06/26/2020   Last lipids Lab Results  Component Value Date   CHOL 175 07/16/2021   HDL 55 07/16/2021   LDLCALC 81 07/16/2021   TRIG 235 (H) 07/16/2021    CHOLHDL 3.2 07/16/2021   Last hemoglobin A1c Lab Results  Component Value Date   HGBA1C 6.4 (H) 07/16/2021   Last thyroid functions Lab Results  Component Value Date   TSH 1.886 06/13/2009   The 10-year ASCVD risk score (Arnett DK, et al., 2019) is: 33.8%    Assessment & Plan:   Problem List Items Addressed This Visit     Essential hypertension, benign - Primary    Presenting today for BP check.  Her blood pressure remains elevated today, 159/79.  Her current regimen consist of losartan 50 mg daily, Procardia XL 90 mg daily, and metoprolol succinate 100 mg twice daily. -Increase losartan to 100 mg daily -Continue Procardia XL and metoprolol at current doses -Follow-up in 4 weeks for BP check and repeat BMP      Controlled diabetes mellitus type 2 with complications ([facility])    Repeat A1c and urine microalbumin/creatinine ratio ordered today.      Return in about 4 weeks (around 02/22/2022).    Johnette Abraham, MD

## 2022-01-26 LAB — HEMOGLOBIN A1C
Est. average glucose Bld gHb Est-mCnc: 137 mg/dL
Hgb A1c MFr Bld: 6.4 % — ABNORMAL HIGH (ref 4.8–5.6)

## 2022-01-28 ENCOUNTER — Encounter: Payer: Self-pay | Admitting: Internal Medicine

## 2022-01-28 LAB — MICROALBUMIN / CREATININE URINE RATIO
Creatinine, Urine: 57.5 mg/dL
Microalb/Creat Ratio: 719 mg/g creat — ABNORMAL HIGH (ref 0–29)
Microalbumin, Urine: 413.5 ug/mL

## 2022-01-29 ENCOUNTER — Other Ambulatory Visit: Payer: Self-pay | Admitting: Nurse Practitioner

## 2022-01-29 DIAGNOSIS — I10 Essential (primary) hypertension: Secondary | ICD-10-CM

## 2022-02-22 ENCOUNTER — Encounter: Payer: Self-pay | Admitting: Internal Medicine

## 2022-02-22 ENCOUNTER — Ambulatory Visit: Payer: Medicare Other | Admitting: Internal Medicine

## 2022-06-07 ENCOUNTER — Encounter: Payer: Self-pay | Admitting: Internal Medicine

## 2022-06-07 ENCOUNTER — Ambulatory Visit (INDEPENDENT_AMBULATORY_CARE_PROVIDER_SITE_OTHER): Payer: 59 | Admitting: Internal Medicine

## 2022-06-07 ENCOUNTER — Other Ambulatory Visit: Payer: Self-pay

## 2022-06-07 VITALS — BP 149/80 | HR 73 | Ht 66.0 in | Wt 194.6 lb

## 2022-06-07 DIAGNOSIS — E1169 Type 2 diabetes mellitus with other specified complication: Secondary | ICD-10-CM

## 2022-06-07 DIAGNOSIS — M67432 Ganglion, left wrist: Secondary | ICD-10-CM

## 2022-06-07 DIAGNOSIS — Z2821 Immunization not carried out because of patient refusal: Secondary | ICD-10-CM

## 2022-06-07 DIAGNOSIS — I10 Essential (primary) hypertension: Secondary | ICD-10-CM

## 2022-06-07 DIAGNOSIS — J4520 Mild intermittent asthma, uncomplicated: Secondary | ICD-10-CM

## 2022-06-07 DIAGNOSIS — L309 Dermatitis, unspecified: Secondary | ICD-10-CM

## 2022-06-07 DIAGNOSIS — E1165 Type 2 diabetes mellitus with hyperglycemia: Secondary | ICD-10-CM

## 2022-06-07 DIAGNOSIS — E118 Type 2 diabetes mellitus with unspecified complications: Secondary | ICD-10-CM

## 2022-06-07 DIAGNOSIS — N1832 Chronic kidney disease, stage 3b: Secondary | ICD-10-CM

## 2022-06-07 DIAGNOSIS — E785 Hyperlipidemia, unspecified: Secondary | ICD-10-CM

## 2022-06-07 DIAGNOSIS — J45991 Cough variant asthma: Secondary | ICD-10-CM

## 2022-06-07 MED ORDER — ALBUTEROL SULFATE (2.5 MG/3ML) 0.083% IN NEBU: 2.5000 mg | INHALATION_SOLUTION | Freq: Three times a day (TID) | RESPIRATORY_TRACT | 0 refills | Status: DC | PRN

## 2022-06-07 MED ORDER — METOPROLOL SUCCINATE ER 100 MG PO TB24: 100.0000 mg | ORAL_TABLET | Freq: Two times a day (BID) | ORAL | 3 refills | Status: DC

## 2022-06-07 MED ORDER — ALBUTEROL SULFATE HFA 108 (90 BASE) MCG/ACT IN AERS: 2.0000 | INHALATION_SPRAY | RESPIRATORY_TRACT | 11 refills | Status: DC | PRN

## 2022-06-07 MED ORDER — BUDESONIDE-FORMOTEROL FUMARATE 80-4.5 MCG/ACT IN AERO: INHALATION_SPRAY | RESPIRATORY_TRACT | 12 refills | Status: DC

## 2022-06-07 MED ORDER — ROSUVASTATIN CALCIUM 40 MG PO TABS: 40.0000 mg | ORAL_TABLET | Freq: Every day | ORAL | 3 refills | Status: DC

## 2022-06-07 MED ORDER — NIFEDIPINE ER OSMOTIC RELEASE 90 MG PO TB24: 90.0000 mg | ORAL_TABLET | Freq: Every day | ORAL | 3 refills | Status: DC

## 2022-06-07 MED ORDER — BETAMETHASONE DIPROPIONATE 0.05 % EX CREA: TOPICAL_CREAM | Freq: Two times a day (BID) | CUTANEOUS | 0 refills | Status: DC | PRN

## 2022-06-07 MED ORDER — FARXIGA 10 MG PO TABS: 10.0000 mg | ORAL_TABLET | Freq: Every day | ORAL | 3 refills | Status: DC

## 2022-06-07 MED ORDER — METOPROLOL SUCCINATE ER 100 MG PO TB24: 100.0000 mg | ORAL_TABLET | Freq: Every day | ORAL | 3 refills | Status: DC

## 2022-06-07 MED ORDER — CHLORTHALIDONE 25 MG PO TABS: 25.0000 mg | ORAL_TABLET | Freq: Every day | ORAL | 2 refills | Status: DC

## 2022-06-07 MED ORDER — LOSARTAN POTASSIUM 100 MG PO TABS: 100.0000 mg | ORAL_TABLET | Freq: Every day | ORAL | 1 refills | Status: DC

## 2022-06-07 NOTE — Patient Instructions (Signed)
It was a pleasure to see you today.  Thank you for giving [place] the opportunity to be involved in your care.  Below is a brief recap of your visit and next steps.  We will plan to see you again in 4 weeks.  Summary Medications refilled Start chlorthalidone 25 mg daily for high blood pressure Follow up in 4 weeks

## 2022-06-07 NOTE — Assessment & Plan Note (Signed)
Previously referred to nephrology in the setting of CKD with severely increased microalbuminuria.  An appointment with nephrology was scheduled but she did not present for this appointment. -We have reached out to nephrology and asked them to contact Lindsey Buckley to schedule a new appointment. -Continue Farxiga and losartan -Repeat labs at follow-up in 1 month

## 2022-06-07 NOTE — Assessment & Plan Note (Signed)
A1c 6.4 in October.  She is currently prescribed Farxiga 10 mg daily. -No medication changes today.  Continue Wilder Glade

## 2022-06-07 NOTE — Assessment & Plan Note (Signed)
Symptoms remain well-controlled with Symbicort and as needed use of albuterol.  Asymptomatic currently.  Pulmonary exam is unremarkable. -No medication changes today.  Symbicort and albuterol have been refilled.

## 2022-06-07 NOTE — Progress Notes (Signed)
Established Patient Office Visit  Subjective   Patient ID: Lindsey Buckley, female    DOB: [birth year]  Age: 72 y.o. MRN: [id number]  Chief Complaint  Patient presents with   Hypertension    Follow up   [name] returns to care today.  She was last evaluated by me on 01/25/22.  Losartan was increased to 100 mg daily at that time.  4-week follow-up was arranged for HTN check.  She did not present for this appointment.  There have been no acute interval events.  [name] returns to care today largely for medication refills.  She states that she feels well.  She is asymptomatic and reports that she will undergo excision of a ganglion cyst on the dorsum of her left wrist later this month (3/30).  She has no additional concerns to discuss today.  Past Medical History:  Diagnosis Date   Asthma 2007   smoke trigged   Chest pain    history of   Diabetes mellitus without complication ([place]) 123XX123   borderline diabetes per pt per MD    Dysrhythmia 2003   Eczema    GERD (gastroesophageal reflux disease)    Hyperlipidemia    Other and unspecified hyperlipidemia    Pulmonary congestion and hypostasis 2003   Pulmonary edema 2003   Unspecified essential hypertension    Past Surgical History:  Procedure Laterality Date   ABDOMINAL HYSTERECTOMY     partial   COLONOSCOPY     remote past in [place]   COLONOSCOPY WITH PROPOFOL N/A 04/07/2020   non-bleeding internal hemorrhoids, multiple diverticula in entire colon, multiple polyps. Tubular adenomas. Surveillance in 5 years.    POLYPECTOMY  04/07/2020   Procedure: POLYPECTOMY;  Surgeon: [name], DO;  [facility]: [facility];  Service: Endoscopy;;   ROBOTIC ASSITED PARTIAL NEPHRECTOMY Right 06/25/2020   Procedure: XI ROBOTIC ASSITED PARTIAL NEPHRECTOMY;  Surgeon: Cleon Gustin, MD;  Location: [facility];  Service: Urology;  Laterality: Right;   TUBAL LIGATION     Social History   Tobacco Use   Smoking status: Former     Packs/day: 0.40    Types: Cigarettes    Quit date: 12/11/1999    Years since quitting: 22.5   Smokeless tobacco: Never  Vaping Use   Vaping Use: Never used  Substance Use Topics   Alcohol use: No   Drug use: No   Family History  Problem Relation Age of Onset   Uterine cancer Sister    Colon cancer Neg Hx    Colon polyps Neg Hx    Allergies  Allergen Reactions   Ace Inhibitors Cough   Lisinopril Cough   Review of Systems  Constitutional:  Negative for chills and fever.  HENT:  Negative for sore throat.   Respiratory:  Negative for cough and shortness of breath.   Cardiovascular:  Negative for chest pain, palpitations and leg swelling.  Gastrointestinal:  Negative for abdominal pain, blood in stool, constipation, diarrhea, nausea and vomiting.  Genitourinary:  Negative for dysuria and hematuria.  Musculoskeletal:  Negative for myalgias.  Skin:  Negative for itching and rash.  Neurological:  Negative for dizziness and headaches.  Psychiatric/Behavioral:  Negative for depression and suicidal ideas.      Objective:     BP (!) 149/80   Pulse 73   Ht '5\' 6"'$  (1.676 m)   Wt 194 lb 9.6 oz (88.3 kg)   SpO2 95%   BMI 31.41 kg/m  BP Readings from  Last 3 Encounters:  06/07/22 (!) 149/80  01/25/22 (!) 159/79  12/28/21 (!) 158/80   Physical Exam Vitals reviewed.  Constitutional:      General: She is not in acute distress.    Appearance: Normal appearance. She is obese. She is not toxic-appearing.  HENT:     Head: Normocephalic and atraumatic.     Right Ear: External ear normal.     Left Ear: External ear normal.     Nose: Nose normal. No congestion or rhinorrhea.     Mouth/Throat:     Mouth: Mucous membranes are moist.     Pharynx: Oropharynx is clear. No oropharyngeal exudate or posterior oropharyngeal erythema.  Eyes:     General: No scleral icterus.    Extraocular Movements: Extraocular movements intact.     Conjunctiva/sclera: Conjunctivae normal.     Pupils:  Pupils are equal, round, and reactive to light.  Cardiovascular:     Rate and Rhythm: Normal rate and regular rhythm.     Pulses: Normal pulses.     Heart sounds: Normal heart sounds. No murmur heard.    No friction rub. No gallop.  Pulmonary:     Effort: Pulmonary effort is normal.     Breath sounds: Normal breath sounds. No wheezing, rhonchi or rales.  Abdominal:     General: Abdomen is flat. Bowel sounds are normal. There is no distension.     Palpations: Abdomen is soft.     Tenderness: There is no abdominal tenderness.  Musculoskeletal:        General: Deformity (Ganglion cyst left wrist) present. No swelling. Normal range of motion.     Cervical back: Normal range of motion.     Right lower leg: No edema.     Left lower leg: No edema.  Lymphadenopathy:     Cervical: No cervical adenopathy.  Skin:    General: Skin is warm and dry.     Capillary Refill: Capillary refill takes less than 2 seconds.     Coloration: Skin is not jaundiced.  Neurological:     General: No focal deficit present.     Mental Status: She is alert and oriented to person, place, and time.  Psychiatric:        Mood and Affect: Mood normal.        Behavior: Behavior normal.   Last CBC Lab Results  Component Value Date   WBC 13.2 (H) 05/31/2021   HGB 13.2 05/31/2021   HCT 39.5 05/31/2021   MCV 87 05/31/2021   MCH 29.0 05/31/2021   RDW 12.5 05/31/2021   PLT 332 [id number]   Last metabolic panel Lab Results  Component Value Date   GLUCOSE 139 (H) 12/28/2021   NA 142 12/28/2021   K 4.5 12/28/2021   CL 105 12/28/2021   CO2 23 12/28/2021   BUN 20 12/28/2021   CREATININE 1.23 (H) 12/28/2021   EGFR 47 (L) 12/28/2021   CALCIUM 9.4 12/28/2021   PROT 7.7 07/16/2021   ALBUMIN 4.7 07/16/2021   LABGLOB 3.0 07/16/2021   AGRATIO 1.6 07/16/2021   BILITOT 0.7 07/16/2021   ALKPHOS 71 07/16/2021   AST 30 07/16/2021   ALT 26 07/16/2021   ANIONGAP 9 06/26/2020   Last lipids Lab Results  Component  Value Date   CHOL 175 07/16/2021   HDL 55 07/16/2021   LDLCALC 81 07/16/2021   TRIG 235 (H) 07/16/2021   CHOLHDL 3.2 07/16/2021   Last hemoglobin A1c Lab Results  Component Value Date  HGBA1C 6.4 (H) 01/25/2022   Last thyroid functions Lab Results  Component Value Date   TSH 1.886 06/13/2009   The 10-year ASCVD risk score (Arnett DK, et al., 2019) is: 31.5%    Assessment & Plan:   Problem List Items Addressed This Visit       Essential hypertension, benign    BP remains elevated today.  166/79 initially and 149/80 on repeat.  He is currently prescribed losartan 100 mg daily, Procardia XL 90 mg daily, and metoprolol succinate 100 mg daily.  Losartan was increased to 100 mg daily at her last appointment in October.  She does not check her blood pressure at home. -Add chlorthalidone 25 mg daily -Follow-up in 4 weeks for HTN check      Mild intermittent asthma without complication - Primary    Symptoms remain well-controlled with Symbicort and as needed use of albuterol.  Asymptomatic currently.  Pulmonary exam is unremarkable. -No medication changes today.  Symbicort and albuterol have been refilled.      Hyperlipidemia associated with type 2 diabetes mellitus ([place])    Lipid panel last updated in April 2023.  Total cholesterol 175 and LDL 81.  Her 10-year ASCVD risk score today is 31.5%.  She is currently prescribed rosuvastatin 40 mg daily. -No medication changes today.  Rosuvastatin has been refilled. -Repeat lipid panel at follow-up in 1 month      Controlled diabetes mellitus type 2 with complications ([place])    123456 6.4 in October.  She is currently prescribed Farxiga 10 mg daily. -No medication changes today.  Continue Farxiga      Stage 3b chronic kidney disease ([place])    Previously referred to nephrology in the setting of CKD with severely increased microalbuminuria.  An appointment with nephrology was scheduled but she did not present for this appointment. -We have  reached out to nephrology and asked them to contact [name] to schedule a new appointment. -Continue Wilder Glade and losartan -Repeat labs at follow-up in 1 month      Ganglion cyst of dorsum of left wrist    She reports that she will undergo excision of the ganglion cyst on the dorsum of her left wrist on 3/30.  She has been seeing an orthopedic surgeon at [facility].      Return in about 4 weeks (around 07/05/2022) for HTN.   Johnette Abraham, MD

## 2022-06-07 NOTE — Assessment & Plan Note (Signed)
She reports that she will undergo excision of the ganglion cyst on the dorsum of her left wrist on 3/30.  She has been seeing an orthopedic surgeon at [facility].

## 2022-06-07 NOTE — Assessment & Plan Note (Signed)
BP remains elevated today.  166/79 initially and 149/80 on repeat.  He is currently prescribed losartan 100 mg daily, Procardia XL 90 mg daily, and metoprolol succinate 100 mg daily.  Losartan was increased to 100 mg daily at her last appointment in October.  She does not check her blood pressure at home. -Add chlorthalidone 25 mg daily -Follow-up in 4 weeks for HTN check

## 2022-06-07 NOTE — Assessment & Plan Note (Signed)
Lipid panel last updated in April 2023.  Total cholesterol 175 and LDL 81.  Her 10-year ASCVD risk score today is 31.5%.  She is currently prescribed rosuvastatin 40 mg daily. -No medication changes today.  Rosuvastatin has been refilled. -Repeat lipid panel at follow-up in 1 month

## 2022-06-13 ENCOUNTER — Encounter: Payer: 59 | Admitting: Internal Medicine

## 2022-07-05 ENCOUNTER — Encounter: Payer: Self-pay | Admitting: Internal Medicine

## 2022-07-05 ENCOUNTER — Ambulatory Visit (INDEPENDENT_AMBULATORY_CARE_PROVIDER_SITE_OTHER): Payer: 59 | Admitting: Internal Medicine

## 2022-07-05 VITALS — BP 138/78 | HR 66 | Ht 64.0 in | Wt 192.6 lb

## 2022-07-05 DIAGNOSIS — I10 Essential (primary) hypertension: Secondary | ICD-10-CM | POA: Diagnosis not present

## 2022-07-05 DIAGNOSIS — E1169 Type 2 diabetes mellitus with other specified complication: Secondary | ICD-10-CM

## 2022-07-05 DIAGNOSIS — E785 Hyperlipidemia, unspecified: Secondary | ICD-10-CM

## 2022-07-05 NOTE — Assessment & Plan Note (Signed)
Currently prescribed rosuvastatin 40 mg daily.  Lipid panel last updated in April 2023. -Repeat lipid panel ordered today

## 2022-07-05 NOTE — Patient Instructions (Signed)
It was a pleasure to see you today.  Thank you for giving [place] the opportunity to be involved in your care.  Below is a brief recap of your visit and next steps.  We will plan to see you again in 3 months.  Summary No medication changes today We will repeat labs and plan for follow up in 3 months

## 2022-07-05 NOTE — Assessment & Plan Note (Signed)
Presenting today for HTN follow-up.  Chlorthalidone 25 mg daily was added at her last appointment.  She is additionally prescribed losartan 100 mg daily, Procardia XL 90 mg daily, and metoprolol succinate 100 mg twice daily.  BP today is 138/78.  She has not experienced any adverse side effects since adding chlorthalidone. -No changes today.  Continue current regimen.  She will be establishing care with nephrology tomorrow. -Repeat BMP ordered today -We will tentatively plan for follow-up in 3 months

## 2022-07-05 NOTE — Progress Notes (Signed)
Established Patient Office Visit  Subjective   Patient ID: Lindsey Buckley, female    DOB: 10-21-1950  Age: 72 y.o. MRN: [id number]  Chief Complaint  Patient presents with   Hypertension    Follow up   [name] returns to care today for HTN follow-up.  She was last evaluated by me on 3/5 at which time chlorthalidone 25 mg daily was added for improved HTN control.  She is additionally prescribed losartan 100 mg daily, Procardia XL 90 mg daily, and metoprolol succinate 100 mg daily.  4-week follow-up was arranged.  There have been no acute interval events. [name] reports feeling well today. She has no acute concerns to discuss.   Past Medical History:  Diagnosis Date   Asthma 2007   smoke trigged   Chest pain    history of   Diabetes mellitus without complication 123XX123   borderline diabetes per pt per MD    Dysrhythmia 2003   Eczema    GERD (gastroesophageal reflux disease)    Hyperlipidemia    Other and unspecified hyperlipidemia    Pulmonary congestion and hypostasis 2003   Pulmonary edema 2003   Unspecified essential hypertension    Past Surgical History:  Procedure Laterality Date   ABDOMINAL HYSTERECTOMY     partial   COLONOSCOPY     remote past in [place]   COLONOSCOPY WITH PROPOFOL N/A 04/07/2020   non-bleeding internal hemorrhoids, multiple diverticula in entire colon, multiple polyps. Tubular adenomas. Surveillance in 5 years.    POLYPECTOMY  04/07/2020   Procedure: POLYPECTOMY;  Surgeon: [name], DO;  [facility]: [facility];  Service: Endoscopy;;   ROBOTIC ASSITED PARTIAL NEPHRECTOMY Right 06/25/2020   Procedure: XI ROBOTIC ASSITED PARTIAL NEPHRECTOMY;  Surgeon: Cleon Gustin, MD;  Location: [facility];  Service: Urology;  Laterality: Right;   TUBAL LIGATION     Social History   Tobacco Use   Smoking status: Former    Packs/day: .4    Types: Cigarettes    Quit date: 12/11/1999    Years since quitting: 22.5   Smokeless tobacco: Never   Vaping Use   Vaping Use: Never used  Substance Use Topics   Alcohol use: No   Drug use: No   Family History  Problem Relation Age of Onset   Uterine cancer Sister    Colon cancer Neg Hx    Colon polyps Neg Hx    Allergies  Allergen Reactions   Ace Inhibitors Cough   Lisinopril Cough   Review of Systems  Constitutional:  Negative for chills and fever.  HENT:  Negative for sore throat.   Respiratory:  Negative for cough and shortness of breath.   Cardiovascular:  Negative for chest pain, palpitations and leg swelling.  Gastrointestinal:  Negative for abdominal pain, blood in stool, constipation, diarrhea, nausea and vomiting.  Genitourinary:  Negative for dysuria and hematuria.  Musculoskeletal:  Negative for myalgias.  Skin:  Negative for itching and rash.  Neurological:  Negative for dizziness and headaches.  Psychiatric/Behavioral:  Negative for depression and suicidal ideas.      Objective:     BP 138/78   Pulse 66   Ht 5\' 4"  (1.626 m)   Wt 192 lb 9.6 oz (87.4 kg)   SpO2 93%   BMI 33.06 kg/m  BP Readings from Last 3 Encounters:  07/05/22 138/78  06/07/22 (!) 149/80  01/25/22 (!) 159/79   Physical Exam Vitals reviewed.  Constitutional:  General: She is not in acute distress.    Appearance: Normal appearance. She is obese. She is not toxic-appearing.  HENT:     Head: Normocephalic and atraumatic.     Right Ear: External ear normal.     Left Ear: External ear normal.     Nose: Nose normal. No congestion or rhinorrhea.     Mouth/Throat:     Mouth: Mucous membranes are moist.     Pharynx: Oropharynx is clear. No oropharyngeal exudate or posterior oropharyngeal erythema.  Eyes:     General: No scleral icterus.    Extraocular Movements: Extraocular movements intact.     Conjunctiva/sclera: Conjunctivae normal.     Pupils: Pupils are equal, round, and reactive to light.  Cardiovascular:     Rate and Rhythm: Normal rate and regular rhythm.     Pulses:  Normal pulses.     Heart sounds: Normal heart sounds. No murmur heard.    No friction rub. No gallop.  Pulmonary:     Effort: Pulmonary effort is normal.     Breath sounds: Normal breath sounds. No wheezing, rhonchi or rales.  Abdominal:     General: Abdomen is flat. Bowel sounds are normal. There is no distension.     Palpations: Abdomen is soft.     Tenderness: There is no abdominal tenderness.  Musculoskeletal:        General: Deformity (Ganglion cyst left wrist) present. No swelling. Normal range of motion.     Cervical back: Normal range of motion.     Right lower leg: No edema.     Left lower leg: No edema.  Lymphadenopathy:     Cervical: No cervical adenopathy.  Skin:    General: Skin is warm and dry.     Capillary Refill: Capillary refill takes less than 2 seconds.     Coloration: Skin is not jaundiced.  Neurological:     General: No focal deficit present.     Mental Status: She is alert and oriented to person, place, and time.  Psychiatric:        Mood and Affect: Mood normal.        Behavior: Behavior normal.   Last CBC Lab Results  Component Value Date   WBC 13.2 (H) 05/31/2021   HGB 13.2 05/31/2021   HCT 39.5 05/31/2021   MCV 87 05/31/2021   MCH 29.0 05/31/2021   RDW 12.5 05/31/2021   PLT 332 [id number]   Last metabolic panel Lab Results  Component Value Date   GLUCOSE 139 (H) 12/28/2021   NA 142 12/28/2021   K 4.5 12/28/2021   CL 105 12/28/2021   CO2 23 12/28/2021   BUN 20 12/28/2021   CREATININE 1.23 (H) 12/28/2021   EGFR 47 (L) 12/28/2021   CALCIUM 9.4 12/28/2021   PROT 7.7 07/16/2021   ALBUMIN 4.7 07/16/2021   LABGLOB 3.0 07/16/2021   AGRATIO 1.6 07/16/2021   BILITOT 0.7 07/16/2021   ALKPHOS 71 07/16/2021   AST 30 07/16/2021   ALT 26 07/16/2021   ANIONGAP 9 06/26/2020   Last lipids Lab Results  Component Value Date   CHOL 175 07/16/2021   HDL 55 07/16/2021   LDLCALC 81 07/16/2021   TRIG 235 (H) 07/16/2021   CHOLHDL 3.2 07/16/2021    Last hemoglobin A1c Lab Results  Component Value Date   HGBA1C 6.4 (H) 01/25/2022   Last thyroid functions Lab Results  Component Value Date   TSH 1.886 06/13/2009   The 10-year ASCVD risk score (  Arnett DK, et al., 2019) is: 27.9%    Assessment & Plan:   Problem List Items Addressed This Visit       Essential hypertension, benign - Primary    Presenting today for HTN follow-up.  Chlorthalidone 25 mg daily was added at her last appointment.  She is additionally prescribed losartan 100 mg daily, Procardia XL 90 mg daily, and metoprolol succinate 100 mg twice daily.  BP today is 138/78.  She has not experienced any adverse side effects since adding chlorthalidone. -No changes today.  Continue current regimen.  She will be establishing care with nephrology tomorrow. -Repeat BMP ordered today -We will tentatively plan for follow-up in 3 months      Hyperlipidemia associated with type 2 diabetes mellitus    Currently prescribed rosuvastatin 40 mg daily.  Lipid panel last updated in April 2023. -Repeat lipid panel ordered today       Return in about 3 months (around 10/04/2022).    Johnette Abraham, MD

## 2022-07-06 ENCOUNTER — Other Ambulatory Visit: Payer: Self-pay | Admitting: Internal Medicine

## 2022-07-06 DIAGNOSIS — E1169 Type 2 diabetes mellitus with other specified complication: Secondary | ICD-10-CM

## 2022-07-06 LAB — BASIC METABOLIC PANEL
BUN/Creatinine Ratio: 12 (ref 12–28)
BUN: 21 mg/dL (ref 8–27)
CO2: 26 mmol/L (ref 20–29)
Calcium: 10.6 mg/dL — ABNORMAL HIGH (ref 8.7–10.3)
Chloride: 100 mmol/L (ref 96–106)
Creatinine, Ser: 1.75 mg/dL — ABNORMAL HIGH (ref 0.57–1.00)
Glucose: 108 mg/dL — ABNORMAL HIGH (ref 70–99)
Potassium: 4.7 mmol/L (ref 3.5–5.2)
Sodium: 142 mmol/L (ref 134–144)
eGFR: 31 mL/min/{1.73_m2} — ABNORMAL LOW (ref >59)

## 2022-07-06 LAB — LIPID PANEL
Chol/HDL Ratio: 3.3 ratio (ref 0.0–4.4)
Cholesterol, Total: 202 mg/dL — ABNORMAL HIGH (ref 100–199)
HDL: 61 mg/dL (ref >39)
LDL Chol Calc (NIH): 109 mg/dL — ABNORMAL HIGH (ref 0–99)
Triglycerides: 188 mg/dL — ABNORMAL HIGH (ref 0–149)
VLDL Cholesterol Cal: 32 mg/dL (ref 5–40)

## 2022-07-06 MED ORDER — EZETIMIBE 10 MG PO TABS: 10.0000 mg | ORAL_TABLET | Freq: Every day | ORAL | 3 refills | Status: DC

## 2022-07-11 ENCOUNTER — Encounter: Payer: 59 | Admitting: Internal Medicine

## 2022-07-19 ENCOUNTER — Other Ambulatory Visit: Payer: Self-pay | Admitting: Internal Medicine

## 2022-07-19 DIAGNOSIS — J4520 Mild intermittent asthma, uncomplicated: Secondary | ICD-10-CM

## 2022-08-02 ENCOUNTER — Other Ambulatory Visit: Payer: Self-pay | Admitting: Otolaryngology

## 2022-08-17 ENCOUNTER — Other Ambulatory Visit: Payer: Self-pay | Admitting: Internal Medicine

## 2022-08-17 DIAGNOSIS — I10 Essential (primary) hypertension: Secondary | ICD-10-CM

## 2022-08-23 ENCOUNTER — Other Ambulatory Visit: Payer: Self-pay | Admitting: Internal Medicine

## 2022-08-23 DIAGNOSIS — I10 Essential (primary) hypertension: Secondary | ICD-10-CM

## 2022-09-02 ENCOUNTER — Other Ambulatory Visit (HOSPITAL_COMMUNITY): Payer: Self-pay | Admitting: Nephrology

## 2022-09-02 DIAGNOSIS — E1122 Type 2 diabetes mellitus with diabetic chronic kidney disease: Secondary | ICD-10-CM

## 2022-09-02 DIAGNOSIS — C641 Malignant neoplasm of right kidney, except renal pelvis: Secondary | ICD-10-CM

## 2022-09-02 DIAGNOSIS — R809 Proteinuria, unspecified: Secondary | ICD-10-CM

## 2022-09-09 ENCOUNTER — Telehealth: Payer: Self-pay | Admitting: Internal Medicine

## 2022-09-09 NOTE — Telephone Encounter (Signed)
Patient came by the office need prescription wants to pick up hard copy. Patient waiting in lobby/  Prescription Request  09/09/2022  LOV: 07/05/2022  What is the name of the medication or equipment? predniSONE (DELTASONE) tablet 60 mg   Have you contacted your pharmacy to request a refill? No   Which pharmacy would you like this sent to?  [facility] [address] [place]    Patient notified that their request is being sent to the clinical staff for review and that they should receive a response within 2 business days.   Please advise at walked in office asked for hard copy.

## 2022-09-09 NOTE — Telephone Encounter (Signed)
Patient aware.

## 2022-09-12 ENCOUNTER — Other Ambulatory Visit: Payer: Self-pay

## 2022-09-12 ENCOUNTER — Emergency Department (HOSPITAL_COMMUNITY): Payer: 59

## 2022-09-12 ENCOUNTER — Encounter (HOSPITAL_COMMUNITY): Payer: Self-pay | Admitting: *Deleted

## 2022-09-12 ENCOUNTER — Emergency Department (HOSPITAL_COMMUNITY)
Admission: EM | Admit: 2022-09-12 | Discharge: 2022-09-12 | Disposition: A | Discharge status: Home / Self Care | Payer: 59 | Attending: Emergency Medicine | Admitting: Emergency Medicine

## 2022-09-12 DIAGNOSIS — Z760 Encounter for issue of repeat prescription: Secondary | ICD-10-CM | POA: Insufficient documentation

## 2022-09-12 DIAGNOSIS — Z79899 Other long term (current) drug therapy: Secondary | ICD-10-CM | POA: Insufficient documentation

## 2022-09-12 DIAGNOSIS — J4541 Moderate persistent asthma with (acute) exacerbation: Secondary | ICD-10-CM | POA: Diagnosis not present

## 2022-09-12 DIAGNOSIS — R7303 Prediabetes: Secondary | ICD-10-CM | POA: Insufficient documentation

## 2022-09-12 DIAGNOSIS — R059 Cough, unspecified: Secondary | ICD-10-CM | POA: Diagnosis present

## 2022-09-12 DIAGNOSIS — I10 Essential (primary) hypertension: Secondary | ICD-10-CM | POA: Diagnosis not present

## 2022-09-12 DIAGNOSIS — Z7951 Long term (current) use of inhaled steroids: Secondary | ICD-10-CM | POA: Insufficient documentation

## 2022-09-12 MED ORDER — IPRATROPIUM BROMIDE 0.02 % IN SOLN
0.5000 mg | Freq: Once | RESPIRATORY_TRACT | Status: AC
  Administered 2022-09-12: 0.5 mg via RESPIRATORY_TRACT
  Filled 2022-09-12: qty 2.5

## 2022-09-12 MED ORDER — ALBUTEROL SULFATE (2.5 MG/3ML) 0.083% IN NEBU
15.0000 mg/h | INHALATION_SOLUTION | RESPIRATORY_TRACT | Status: AC
  Administered 2022-09-12: 15 mg/h via RESPIRATORY_TRACT
  Filled 2022-09-12: qty 18

## 2022-09-12 MED ORDER — ALBUTEROL SULFATE (2.5 MG/3ML) 0.083% IN NEBU: 2.5000 mg | INHALATION_SOLUTION | Freq: Four times a day (QID) | RESPIRATORY_TRACT | 12 refills | Status: DC | PRN

## 2022-09-12 NOTE — ED Triage Notes (Signed)
Pt in c/o coughing, congestion, & wheezing onset x , pt in NAD, A&O x4

## 2022-09-12 NOTE — ED Provider Notes (Signed)
[place] EMERGENCY DEPARTMENT AT [facility] Provider Note   CSN: [id number] Arrival date & time: 09/12/22  4098     History  Chief Complaint  Patient presents with   Cough    Lindsey Buckley is a 72 y.o. female with past medical history prediabetes, asthma, GERD, hyperlipidemia, hypertension who presents to the ED complaining of dry cough and wheezing for at least the last month.  Patient states that her doctor had to leave the practice due to medical conditions and that she has been out of her nebulizer medication and her machine quit working at least a month ago.  No known sick contacts.  No associated fever, chest pain, shortness of breath, nausea, vomiting, or other complaints.  States that she needs refills of her medications and that is the primary reason for her visit.      Home Medications Prior to Admission medications   Medication Sig Start Date End Date Taking? Authorizing Provider  albuterol (PROVENTIL) (2.5 MG/3ML) 0.083% nebulizer solution Take 3 mLs (2.5 mg total) by nebulization every 6 (six) hours as needed for wheezing or shortness of breath. 09/12/22 12/11/22 Yes Davontae Prusinski L, PA-C  albuterol (VENTOLIN HFA) 108 (90 Base) MCG/ACT inhaler Inhale 2 puffs into the lungs every 4 (four) hours as needed. 06/07/22  Yes Billie Lade, MD  betamethasone dipropionate 0.05 % cream Apply topically 2 (two) times daily as needed (eczema). 06/07/22  Yes Billie Lade, MD  budesonide-formoterol [facility]) 80-4.5 MCG/ACT inhaler Take 2 puffs first thing in am and then another 2 puffs about 12 hours later. Patient taking differently: Inhale 2 puffs into the lungs 2 (two) times daily. 06/07/22  Yes Billie Lade, MD  chlorthalidone (HYGROTON) 25 MG tablet TAKE 1 TABLET(25 MG) BY MOUTH DAILY Patient taking differently: Take 25 mg by mouth daily. 08/17/22  Yes Billie Lade, MD  ezetimibe (ZETIA) 10 MG tablet Take 1 tablet (10 mg total) by mouth daily. 07/06/22  Yes  Billie Lade, MD  FARXIGA 10 MG TABS tablet Take 1 tablet (10 mg total) by mouth daily. 06/07/22  Yes Billie Lade, MD  losartan (COZAAR) 100 MG tablet TAKE 1 TABLET(100 MG) BY MOUTH DAILY Patient taking differently: Take 100 mg by mouth daily. 08/23/22  Yes Billie Lade, MD  metoprolol succinate (TOPROL-XL) 100 MG 24 hr tablet Take 1 tablet (100 mg total) by mouth in the morning and at bedtime. 06/07/22  Yes Billie Lade, MD  NIFEdipine (PROCARDIA XL/NIFEDICAL-XL) 90 MG 24 hr tablet Take 1 tablet (90 mg total) by mouth daily. 06/07/22  Yes Billie Lade, MD  pantoprazole (PROTONIX) 40 MG tablet Take 40 mg by mouth daily. 07/07/22  Yes [provider]  RESTASIS 0.05 % ophthalmic emulsion Place 1 drop into both eyes 2 (two) times daily. 09/05/22  Yes [provider]  rosuvastatin (CRESTOR) 40 MG tablet Take 1 tablet (40 mg total) by mouth daily. 06/07/22  Yes Billie Lade, MD      Allergies    Ace inhibitors and Lisinopril    Review of Systems   Review of Systems  All other systems reviewed and are negative.   Physical Exam Updated Vital Signs BP 139/84 (BP Location: Right Arm)   Pulse 68   Temp 98.2 F (36.8 C) (Oral)   Resp 18   SpO2 100%  Physical Exam Vitals and nursing note reviewed.  Constitutional:      General: She is not in acute  distress.    Appearance: Normal appearance.  HENT:     Head: Normocephalic and atraumatic.     Mouth/Throat:     Mouth: Mucous membranes are moist.     Comments: Normal phonation Eyes:     Conjunctiva/sclera: Conjunctivae normal.  Cardiovascular:     Rate and Rhythm: Normal rate and regular rhythm.     Heart sounds: No murmur heard. Pulmonary:     Effort: Pulmonary effort is normal. No respiratory distress.     Breath sounds: No stridor. Wheezing (minimal scattered expiratory) present. No rhonchi or rales.  Abdominal:     General: Abdomen is flat.     Palpations: Abdomen is soft.     Tenderness: There is no  abdominal tenderness.  Musculoskeletal:        General: Normal range of motion.     Cervical back: Normal range of motion and neck supple. No rigidity.     Right lower leg: No edema.     Left lower leg: No edema.  Skin:    General: Skin is warm and dry.     Capillary Refill: Capillary refill takes less than 2 seconds.  Neurological:     Mental Status: She is alert. Mental status is at baseline.  Psychiatric:        Behavior: Behavior normal.     ED Results / Procedures / Treatments   Labs (all labs ordered are listed, but only abnormal results are displayed) Labs Reviewed - No data to display  EKG None  Radiology DG Chest 2 View  Result Date: 09/12/2022 CLINICAL DATA:  Cough for 1 month. EXAM: CHEST - 2 VIEW COMPARISON:  04/14/2021. FINDINGS: Clear lungs. Stable cardiac and mediastinal contours. No pleural effusion or pneumothorax. Visualized bones and upper abdomen are unremarkable. IMPRESSION: No evidence of acute cardiopulmonary disease. Electronically Signed   By: Orvan Falconer M.D.   On: 09/12/2022 11:03    Procedures Procedures    Medications Ordered in ED Medications  albuterol (PROVENTIL) (2.5 MG/3ML) 0.083% nebulizer solution (15 mg/hr Nebulization New Bag/Given 09/12/22 1049)  ipratropium (ATROVENT) nebulizer solution 0.5 mg (0.5 mg Nebulization Given 09/12/22 1049)    ED Course/ Medical Decision Making/ A&P                             Medical Decision Making Amount and/or Complexity of Data Reviewed Radiology: ordered. Decision-making details documented in ED Course.  Risk Prescription drug management.   Medical Decision Making:   [name] is a 72 y.o. female who presented to the ED today with cough detailed above.    Patient's presentation is complicated by their history of asthma, advanced age.  Complete initial physical exam performed, notably the patient was in no acute distress.  She had minimal occasional expiratory wheezing.  No  respiratory distress.  Normal phonation, no stridor.  Speaking in full sentences with ease.    Reviewed and confirmed nursing documentation for past medical history, family history, social history.    Initial Assessment:   With the patient's presentation, differential diagnosis for emergent cause of cough includes but is not limited to upper respiratory infection, lower respiratory infection, allergies, asthma, irritants, foreign body, medications such as ACE inhibitors, reflux, asthma, CHF, lung cancer, interstitial lung disease, psychiatric causes, postnasal drip and postinfectious bronchospasm.  This is most consistent with an acute complicated illness  Initial Plan:  CXR to evaluate for structural/infectious intrathoracic pathology.  Symptomatic management  Objective evaluation as below reviewed   Initial Study Results:   Radiology:  All images reviewed independently. Agree with radiology report at this time.   DG Chest 2 View  Result Date: 09/12/2022 CLINICAL DATA:  Cough for 1 month. EXAM: CHEST - 2 VIEW COMPARISON:  04/14/2021. FINDINGS: Clear lungs. Stable cardiac and mediastinal contours. No pleural effusion or pneumothorax. Visualized bones and upper abdomen are unremarkable. IMPRESSION: No evidence of acute cardiopulmonary disease. Electronically Signed   By: Orvan Falconer M.D.   On: 09/12/2022 11:03      Final Assessment and Plan:   72 year old female with a past medical history asthma presents to the ED complaining of at least 1 month of cough and wheezing.  She has been out of her nebulizer treatments at home.  States history of asthma.  No respiratory distress.  Oxygen saturation on room air.  Afebrile.  Occasional expiratory wheezing.  Chest x-ray negative.  Will refill patient's medications and have closely follow-up with primary care.  Strict ED return precautions given, all questions answered, and stable for discharge.   Clinical Impression:  1. Moderate persistent  asthma with exacerbation   2. Medication refill      Discharge           Final Clinical Impression(s) / ED Diagnoses Final diagnoses:  Moderate persistent asthma with exacerbation  Medication refill    Rx / DC Orders ED Discharge Orders          Ordered    For home use only DME Nebulizer machine        09/12/22 1054    albuterol (PROVENTIL) (2.5 MG/3ML) 0.083% nebulizer solution  Every 6 hours PRN        09/12/22 1054              Tonette Lederer, PA-C 09/12/22 1111    Pricilla Loveless, MD 09/13/22 [phone]

## 2022-09-12 NOTE — Discharge Instructions (Addendum)
Thank you for letting us take care of you today.  Your chest x-ray was normal.   I refilled your albuterol treatments and provided a prescription for a nebulizer machine. Continue to take your other medications as previously prescribed by your outpatient team. Follow up with your PCP within the next week for re-evaluation. If you need a new PCP, I provided 2 clinics you may schedule an appointment with or you may go to a PCP of your own choosing.  For new or worsening symptoms such as chest pain, difficulty breathing, fever, confusion, or other new, concerning symptoms, return to nearest ED for re-evaluation.

## 2022-09-13 ENCOUNTER — Emergency Department (HOSPITAL_COMMUNITY)
Admission: EM | Admit: 2022-09-13 | Discharge: 2022-09-13 | Disposition: A | Discharge status: Home / Self Care | Payer: 59 | Attending: Emergency Medicine | Admitting: Emergency Medicine

## 2022-09-13 ENCOUNTER — Encounter (HOSPITAL_COMMUNITY): Payer: Self-pay

## 2022-09-13 ENCOUNTER — Emergency Department (HOSPITAL_COMMUNITY): Payer: 59

## 2022-09-13 ENCOUNTER — Telehealth: Payer: Self-pay

## 2022-09-13 ENCOUNTER — Other Ambulatory Visit: Payer: Self-pay

## 2022-09-13 DIAGNOSIS — J4521 Mild intermittent asthma with (acute) exacerbation: Secondary | ICD-10-CM | POA: Diagnosis not present

## 2022-09-13 DIAGNOSIS — E119 Type 2 diabetes mellitus without complications: Secondary | ICD-10-CM | POA: Insufficient documentation

## 2022-09-13 DIAGNOSIS — R0602 Shortness of breath: Secondary | ICD-10-CM | POA: Diagnosis present

## 2022-09-13 LAB — CBC WITH DIFFERENTIAL/PLATELET
Abs Immature Granulocytes: 0.02 10*3/uL (ref 0.00–0.07)
Basophils Absolute: 0.1 10*3/uL (ref 0.0–0.1)
Basophils Relative: 1 %
Eosinophils Absolute: 0.6 10*3/uL — ABNORMAL HIGH (ref 0.0–0.5)
Eosinophils Relative: 5 %
HCT: 37.3 % (ref 36.0–46.0)
Hemoglobin: 12.1 g/dL (ref 12.0–15.0)
Immature Granulocytes: 0 %
Lymphocytes Relative: 16 %
Lymphs Abs: 1.8 10*3/uL (ref 0.7–4.0)
MCH: 29.7 pg (ref 26.0–34.0)
MCHC: 32.4 g/dL (ref 30.0–36.0)
MCV: 91.4 fL (ref 80.0–100.0)
Monocytes Absolute: 1.1 10*3/uL — ABNORMAL HIGH (ref 0.1–1.0)
Monocytes Relative: 10 %
Neutro Abs: 7.5 10*3/uL (ref 1.7–7.7)
Neutrophils Relative %: 68 %
Platelets: 281 10*3/uL (ref 150–400)
RBC: 4.08 MIL/uL (ref 3.87–5.11)
RDW: 13.1 % (ref 11.5–15.5)
WBC: 11 10*3/uL — ABNORMAL HIGH (ref 4.0–10.5)
nRBC: 0 % (ref 0.0–0.2)

## 2022-09-13 LAB — BASIC METABOLIC PANEL
Anion gap: 10 (ref 5–15)
BUN: 28 mg/dL — ABNORMAL HIGH (ref 8–23)
CO2: 26 mmol/L (ref 22–32)
Calcium: 9.8 mg/dL (ref 8.9–10.3)
Chloride: 103 mmol/L (ref 98–111)
Creatinine, Ser: 2.13 mg/dL — ABNORMAL HIGH (ref 0.44–1.00)
GFR, Estimated: 24 mL/min — ABNORMAL LOW (ref >60)
Glucose, Bld: 117 mg/dL — ABNORMAL HIGH (ref 70–99)
Potassium: 4.1 mmol/L (ref 3.5–5.1)
Sodium: 139 mmol/L (ref 135–145)

## 2022-09-13 LAB — BRAIN NATRIURETIC PEPTIDE: B Natriuretic Peptide: 28 pg/mL (ref 0.0–100.0)

## 2022-09-13 MED ORDER — PREDNISONE 50 MG PO TABS
60.0000 mg | ORAL_TABLET | Freq: Once | ORAL | Status: AC
  Administered 2022-09-13: 60 mg via ORAL
  Filled 2022-09-13: qty 1

## 2022-09-13 MED ORDER — BENZONATATE 100 MG PO CAPS
200.0000 mg | ORAL_CAPSULE | Freq: Once | ORAL | Status: AC
  Administered 2022-09-13: 200 mg via ORAL
  Filled 2022-09-13: qty 2

## 2022-09-13 MED ORDER — PREDNISONE 50 MG PO TABS: ORAL_TABLET | ORAL | 0 refills | Status: DC

## 2022-09-13 MED ORDER — BENZONATATE 100 MG PO CAPS: 200.0000 mg | ORAL_CAPSULE | Freq: Three times a day (TID) | ORAL | 0 refills | Status: DC | PRN

## 2022-09-13 MED ORDER — IPRATROPIUM-ALBUTEROL 0.5-2.5 (3) MG/3ML IN SOLN
3.0000 mL | Freq: Once | RESPIRATORY_TRACT | Status: AC
  Administered 2022-09-13: 3 mL via RESPIRATORY_TRACT
  Filled 2022-09-13: qty 3

## 2022-09-13 MED ORDER — ALBUTEROL SULFATE HFA 108 (90 BASE) MCG/ACT IN AERS: 2.0000 | INHALATION_SPRAY | RESPIRATORY_TRACT | Status: DC | PRN

## 2022-09-13 NOTE — ED Triage Notes (Signed)
Pt was seen here yesterday for bronchitis and shortness of breath. Pt has had coughing, congestin, wheezing and SOB x1 month. Complaining of same. Pt was not able to get prescriptions because states pharmacy did not have them.

## 2022-09-13 NOTE — Discharge Instructions (Signed)
Take your next dose of prednisone tomorrow evening.  Take the entire course of 5 days.  You may also use the Tessalon if needed for suppression of coughing.  Continue using your albuterol as needed if the wheezing returns or persists.

## 2022-09-13 NOTE — ED Provider Notes (Signed)
[place] EMERGENCY DEPARTMENT AT [facility] Provider Note   CSN: [id number] Arrival date & time: 09/13/22  2005     History  Chief Complaint  Patient presents with   Shortness of Breath    Lindsey Buckley is a 72 y.o. female with a history significant for asthma, type 2 diabetes, GERD, prior history of pulmonary edema presenting for evaluation of persistent cough, congestion, wheezing along with shortness of breath, cough has been present for greater than 1 month.  She describes clear to white sputum production with her cough.She denies fevers or chills, chest pain, nausea, vomiting pleuritic pain or orthopnea.  Also denies increased peripheral edema.  She had run out of her nebulizer medication and her nebulizer machine quit working a month ago.  She was seen here yesterday and asked for prescriptions to replace these items.  She returns today after taking 3 nebulizer treatments and reports she continues to cough despite this treatment.  She has tried other OTC medications for the cough without relief of the symptom.  She does endorse intermittent wheezing but not currently.  The history is provided by the patient.       Home Medications Prior to Admission medications   Medication Sig Start Date End Date Taking? Authorizing Provider  benzonatate (TESSALON) 100 MG capsule Take 2 capsules (200 mg total) by mouth 3 (three) times daily as needed. 09/13/22  Yes Koltyn Kelsay, Raynelle Fanning, PA-C  predniSONE (DELTASONE) 50 MG tablet Take one tablet daily for 5 days 09/13/22  Yes Jamorris Ndiaye, Raynelle Fanning, PA-C  albuterol (PROVENTIL) (2.5 MG/3ML) 0.083% nebulizer solution Take 3 mLs (2.5 mg total) by nebulization every 6 (six) hours as needed for wheezing or shortness of breath. 09/12/22 12/11/22  Gowens, Mariah L, PA-C  albuterol (VENTOLIN HFA) 108 (90 Base) MCG/ACT inhaler Inhale 2 puffs into the lungs every 4 (four) hours as needed. 06/07/22   Billie Lade, MD  betamethasone dipropionate 0.05 % cream Apply  topically 2 (two) times daily as needed (eczema). 06/07/22   Billie Lade, MD  budesonide-formoterol (SYMBICORT) 80-4.5 MCG/ACT inhaler Take 2 puffs first thing in am and then another 2 puffs about 12 hours later. Patient taking differently: Inhale 2 puffs into the lungs 2 (two) times daily. 06/07/22   Billie Lade, MD  chlorthalidone (HYGROTON) 25 MG tablet TAKE 1 TABLET(25 MG) BY MOUTH DAILY Patient taking differently: Take 25 mg by mouth daily. 08/17/22   Billie Lade, MD  ezetimibe (ZETIA) 10 MG tablet Take 1 tablet (10 mg total) by mouth daily. 07/06/22   Billie Lade, MD  FARXIGA 10 MG TABS tablet Take 1 tablet (10 mg total) by mouth daily. 06/07/22   Billie Lade, MD  losartan (COZAAR) 100 MG tablet TAKE 1 TABLET(100 MG) BY MOUTH DAILY Patient taking differently: Take 100 mg by mouth daily. 08/23/22   Billie Lade, MD  metoprolol succinate (TOPROL-XL) 100 MG 24 hr tablet Take 1 tablet (100 mg total) by mouth in the morning and at bedtime. 06/07/22   Billie Lade, MD  NIFEdipine (PROCARDIA XL/NIFEDICAL-XL) 90 MG 24 hr tablet Take 1 tablet (90 mg total) by mouth daily. 06/07/22   Billie Lade, MD  pantoprazole (PROTONIX) 40 MG tablet Take 40 mg by mouth daily. 07/07/22   [provider]  RESTASIS 0.05 % ophthalmic emulsion Place 1 drop into both eyes 2 (two) times daily. 09/05/22   [provider]  rosuvastatin (CRESTOR) 40 MG tablet Take 1 tablet (40  mg total) by mouth daily. 06/07/22   Billie Lade, MD      Allergies    Ace inhibitors and Lisinopril    Review of Systems   Review of Systems  Constitutional:  Negative for chills and fever.  HENT:  Negative for congestion and sore throat.   Eyes: Negative.   Respiratory:  Positive for cough, shortness of breath and wheezing. Negative for chest tightness.   Cardiovascular:  Negative for chest pain, palpitations and leg swelling.  Gastrointestinal:  Negative for abdominal pain and nausea.  Genitourinary:  Negative.   Musculoskeletal:  Negative for arthralgias, joint swelling and neck pain.  Skin: Negative.  Negative for rash and wound.  Neurological:  Negative for dizziness, weakness, light-headedness, numbness and headaches.  Psychiatric/Behavioral: Negative.    All other systems reviewed and are negative.   Physical Exam Updated Vital Signs BP 128/72   Pulse 74   Temp 97.7 F (36.5 C) (Oral)   Resp 18   SpO2 95%  Physical Exam Vitals and nursing note reviewed.  Constitutional:      Appearance: She is well-developed.  HENT:     Head: Normocephalic and atraumatic.  Eyes:     Conjunctiva/sclera: Conjunctivae normal.  Cardiovascular:     Rate and Rhythm: Normal rate and regular rhythm.     Heart sounds: Normal heart sounds.  Pulmonary:     Effort: Pulmonary effort is normal.     Breath sounds: Examination of the left-upper field reveals wheezing. Wheezing present. No rhonchi or rales.     Comments: Distant breath sounds throughout all lung fields.  Expiratory wheeze left upper field which cleared with cough.  No rhonchi, Abdominal:     General: Bowel sounds are normal.     Palpations: Abdomen is soft.     Tenderness: There is no abdominal tenderness.  Musculoskeletal:        General: Normal range of motion.     Cervical back: Normal range of motion.     Right lower leg: No edema.     Left lower leg: No edema.  Skin:    General: Skin is warm and dry.  Neurological:     Mental Status: She is alert.     ED Results / Procedures / Treatments   Labs (all labs ordered are listed, but only abnormal results are displayed) Labs Reviewed  CBC WITH DIFFERENTIAL/PLATELET - Abnormal; Notable for the following components:      Result Value   WBC 11.0 (*)    Monocytes Absolute 1.1 (*)    Eosinophils Absolute 0.6 (*)    All other components within normal limits  BASIC METABOLIC PANEL - Abnormal; Notable for the following components:   Glucose, Bld 117 (*)    BUN 28 (*)     Creatinine, Ser 2.13 (*)    GFR, Estimated 24 (*)    All other components within normal limits  BRAIN NATRIURETIC PEPTIDE    EKG None  Radiology DG Chest 2 View  Result Date: 09/13/2022 CLINICAL DATA:  Shortness of breath. EXAM: CHEST - 2 VIEW COMPARISON:  September 12, 2022 FINDINGS: The heart size and mediastinal contours are within normal limits. There is moderate severity calcification of the aortic arch and tortuosity of the descending thoracic aorta. Both lungs are clear. Multilevel degenerative changes seen throughout the thoracic spine. IMPRESSION: No active cardiopulmonary disease. Electronically Signed   By: Aram Candela M.D.   On: 09/13/2022 21:52    Procedures Procedures  Medications Ordered in ED Medications  ipratropium-albuterol (DUONEB) 0.5-2.5 (3) MG/3ML nebulizer solution 3 mL (3 mLs Nebulization Given 09/13/22 2235)  benzonatate (TESSALON) capsule 200 mg (200 mg Oral Given 09/13/22 2229)  predniSONE (DELTASONE) tablet 60 mg (60 mg Oral Given 09/13/22 2230)    ED Course/ Medical Decision Making/ A&P                             Medical Decision Making Patient with a history of asthma and a 1 month history of cough, intermittent wheezing which improves briefly with her albuterol, either neb or MDI which she had not run out of previously.  No exam findings to suggest CHF, however given her history of pulmonary edema BNP level was obtained to rule this out as a trigger for this cough, her BNP is normal at 28.  Other labs are reassuring, she does have a mild leukocytosis of 11.0, she has a normal hemoglobin level.    Amount and/or Complexity of Data Reviewed Labs: ordered.    Details: Per above. Radiology: ordered.    Details: Chest x-ray is clear, no pneumonia, no interstitial edema.  Risk Prescription drug management. Risk Details: Patient with persistent cough and intermittent wheezing, suspect the cough may be a asthma equivalent, she is placed on a 1 week  pulse prednisone dosing and also offered Tessalon [place].  She received both of these treatments here along with the neb treatment after which her cough seemed much better controlled compared to when she first arrived.  Advise close follow-up with her primary provider for any persistent or worsening symptoms.           Final Clinical Impression(s) / ED Diagnoses Final diagnoses:  Mild intermittent asthma with exacerbation    Rx / DC Orders ED Discharge Orders          Ordered    predniSONE (DELTASONE) 50 MG tablet        09/13/22 2339    benzonatate (TESSALON) 100 MG capsule  3 times daily PRN        09/13/22 2339              Burgess Amor, PA-C 09/15/22 1656    Bethann Berkshire, MD 09/17/22 1212

## 2022-09-13 NOTE — Progress Notes (Signed)
Came in to give patient breathing treatment and sat was in upper 80s.  Placed patient on 2L [place].

## 2022-09-13 NOTE — Transitions of Care (Post Inpatient/ED Visit) (Signed)
09/13/2022  Name: Lindsey Buckley MRN: [id number] DOB: 09/02/1950  Today's TOC FU Call Status: Today's TOC FU Call Status:: Successful TOC FU Call Competed TOC FU Call Complete Date: 09/13/22  Transition Care Management Follow-up Telephone Call Date of Discharge: 09/12/22 [facility] Type of Discharge: Emergency Department Reason for ED Visit: Other: (asthma) How have you been since you were released from the hospital?: Same Any questions or concerns?: No  Items Reviewed: Did you receive and understand the discharge instructions provided?: Yes Medications obtained,verified, and reconciled?: Yes (Medications Reviewed) Any new allergies since your discharge?: No Dietary orders reviewed?: NA Do you have support at home?: No  Medications Reviewed Today: Medications Reviewed Today     Reviewed by Annabell Sabal, CMA (Certified Medical Assistant) on 09/13/22 at [phone]  Med List Status: <None>   Medication Order Taking? Sig Documenting Provider Last Dose Status Informant  albuterol (PROVENTIL) (2.5 MG/3ML) 0.083% nebulizer solution [id number] Yes Take 3 mLs (2.5 mg total) by nebulization every 6 (six) hours as needed for wheezing or shortness of breath. Gowens, Mariah L, PA-C Taking Active   albuterol (VENTOLIN HFA) 108 (90 Base) MCG/ACT inhaler [id number] Yes Inhale 2 puffs into the lungs every 4 (four) hours as needed. Billie Lade, MD Taking Active Self  betamethasone dipropionate 0.05 % cream [id number] Yes Apply topically 2 (two) times daily as needed (eczema). Billie Lade, MD Taking Active Self  budesonide-formoterol [facility]) 80-4.5 MCG/ACT inhaler [id number] Yes Take 2 puffs first thing in am and then another 2 puffs about 12 hours later.  Patient taking differently: Inhale 2 puffs into the lungs 2 (two) times daily.   Billie Lade, MD Taking Active Self  chlorthalidone (HYGROTON) 25 MG tablet [id number] Yes TAKE 1 TABLET(25 MG) BY MOUTH  DAILY  Patient taking differently: Take 25 mg by mouth daily.   Billie Lade, MD Taking Active Self  ezetimibe (ZETIA) 10 MG tablet [id number] Yes Take 1 tablet (10 mg total) by mouth daily. Billie Lade, MD Taking Active Self  FARXIGA 10 MG TABS tablet [id number] Yes Take 1 tablet (10 mg total) by mouth daily. Billie Lade, MD Taking Active Self  losartan (COZAAR) 100 MG tablet [id number] Yes TAKE 1 TABLET(100 MG) BY MOUTH DAILY  Patient taking differently: Take 100 mg by mouth daily.   Billie Lade, MD Taking Active Self  metoprolol succinate (TOPROL-XL) 100 MG 24 hr tablet [id number] Yes Take 1 tablet (100 mg total) by mouth in the morning and at bedtime. Billie Lade, MD Taking Active Self  NIFEdipine (PROCARDIA XL/NIFEDICAL-XL) 90 MG 24 hr tablet [id number] Yes Take 1 tablet (90 mg total) by mouth daily. Billie Lade, MD Taking Active Self  pantoprazole (PROTONIX) 40 MG tablet [id number] Yes Take 40 mg by mouth daily. [provider] Taking Active Self  RESTASIS 0.05 % ophthalmic emulsion [id number] Yes Place 1 drop into both eyes 2 (two) times daily. [provider] Taking Active Self  rosuvastatin (CRESTOR) 40 MG tablet [id number] Yes Take 1 tablet (40 mg total) by mouth daily. Billie Lade, MD Taking Active Self            Home Care and Equipment/Supplies: Were Home Health Services Ordered?: No Any new equipment or medical supplies ordered?: No  Functional Questionnaire: Do you need assistance with bathing/showering or dressing?: No Do you need assistance with meal preparation?: No Do you need assistance with eating?: No Do you have difficulty maintaining continence: No  Do you need assistance with getting out of bed/getting out of a chair/moving?: No Do you have difficulty managing or taking your medications?: No  Follow up appointments reviewed: PCP Follow-up appointment confirmed?: Yes Date of PCP follow-up appointment?:  09/19/22 Follow-up Provider: [facility] Follow-up appointment confirmed?: No Reason Specialist Follow-Up Not Confirmed: Patient has Specialist Provider Number and will Call for Appointment Do you need transportation to your follow-up appointment?: No Do you understand care options if your condition(s) worsen?: Yes-patient verbalized understanding    SIGNATURE Fredirick Maudlin

## 2022-09-19 ENCOUNTER — Ambulatory Visit (INDEPENDENT_AMBULATORY_CARE_PROVIDER_SITE_OTHER): Payer: 59 | Admitting: Internal Medicine

## 2022-09-19 ENCOUNTER — Encounter: Payer: Self-pay | Admitting: Internal Medicine

## 2022-09-19 VITALS — BP 141/83 | HR 72 | Ht 64.0 in | Wt 193.0 lb

## 2022-09-19 DIAGNOSIS — J4531 Mild persistent asthma with (acute) exacerbation: Secondary | ICD-10-CM | POA: Diagnosis not present

## 2022-09-19 MED ORDER — PREDNISONE 10 MG PO TABS
ORAL_TABLET | ORAL | 0 refills | Status: AC
Start: 2022-09-19 — End: 2022-09-30

## 2022-09-19 NOTE — Assessment & Plan Note (Addendum)
Patient is not wheezing on exam, but used inhaler before visit. Pulse ox reads 94% and patient has some mild shortness of breath cough and chest tightness suggestive of exacerbation.  Chronic problem with exacerbation  predniSONE (DELTASONE) 10 MG tablet; Take 4 tablets (40 mg total) by mouth daily for 5 days, THEN 2 tablets (20 mg total) daily for 2 days, THEN 1 tablet (10 mg total) daily for 2 days, THEN 0.5 tablets (5 mg total) daily for 2 days.  Dispense: 27 tablet; Refill: 0 Continue using Symbicort daily Continue using albuterol for a rescue inhaler Follow up if not improving

## 2022-09-19 NOTE — Progress Notes (Signed)
   HPI:Ms.Lindsey Buckley is a 72 y.o. female who presents for evaluation of Asthma  Patient has history of mild intermittent asthma and recently seen in ED for exacerbation. She took prednisone for 5 days. She presents  today for ongoing symptoms of chest tightness, dyspnea, non-productive cough, and wheezing. These symptoms started with second hand exposure to cigarette smoke. She previously has been prescribed prednisone with longer taper when having exacerbations and request prednisone today. She is not having any nighttime symptoms, but has needed to use albuterol daily.  She is adherent to Symbicort. Only using albuterol once weekly before this exacerbation.    Physical Exam: Vitals:   09/19/22 0959  BP: (!) 141/83  Pulse: 72  SpO2: 94%  Weight: 193 lb (87.5 kg)  Height: 5\' 4"  (1.626 m)     Physical Exam Constitutional:      General: She is not in acute distress. HENT:     Nose: Congestion present.  Cardiovascular:     Rate and Rhythm: Normal rate and regular rhythm.  Pulmonary:     Effort: Pulmonary effort is normal.     Breath sounds: No stridor. No wheezing or rhonchi.  Musculoskeletal:     Right lower leg: No edema.     Left lower leg: No edema.      Assessment & Plan:   Lindsey Buckley was seen today for asthma.  Mild persistent asthma with acute exacerbation Assessment & Plan: Patient is not wheezing on exam, but used inhaler before visit. Pulse ox reads 94% and patient has some mild shortness of breath cough and chest tightness suggestive of exacerbation.  Chronic problem with exacerbation  predniSONE (DELTASONE) 10 MG tablet; Take 4 tablets (40 mg total) by mouth daily for 5 days, THEN 2 tablets (20 mg total) daily for 2 days, THEN 1 tablet (10 mg total) daily for 2 days, THEN 0.5 tablets (5 mg total) daily for 2 days.  Dispense: 27 tablet; Refill: 0 Continue using Symbicort daily Continue using albuterol for a rescue inhaler Follow up if not  improving  Orders: -     predniSONE; Take 4 tablets (40 mg total) by mouth daily for 5 days, THEN 2 tablets (20 mg total) daily for 2 days, THEN 1 tablet (10 mg total) daily for 2 days, THEN 0.5 tablets (5 mg total) daily for 2 days.  Dispense: 27 tablet; Refill: 0      Milus Banister, MD

## 2022-09-19 NOTE — Patient Instructions (Signed)
Thank you, Ms.Desi D [place] for allowing [place] to provide your care today.   I have prescribed prednisone with a taper.  Continue using Symbicort daily Continue using albuterol for a rescue inhaler Follow up if not improving   Thurmon Fair, M.D.

## 2022-09-27 ENCOUNTER — Ambulatory Visit (HOSPITAL_COMMUNITY)
Admission: RE | Admit: 2022-09-27 | Discharge: 2022-09-27 | Disposition: A | Discharge status: Home / Self Care | Payer: 59 | Source: Ambulatory Visit | Attending: Nephrology | Admitting: Nephrology

## 2022-09-27 DIAGNOSIS — C641 Malignant neoplasm of right kidney, except renal pelvis: Secondary | ICD-10-CM | POA: Diagnosis present

## 2022-09-27 DIAGNOSIS — R809 Proteinuria, unspecified: Secondary | ICD-10-CM | POA: Diagnosis present

## 2022-09-27 DIAGNOSIS — N186 End stage renal disease: Secondary | ICD-10-CM | POA: Diagnosis present

## 2022-09-27 DIAGNOSIS — E1122 Type 2 diabetes mellitus with diabetic chronic kidney disease: Secondary | ICD-10-CM | POA: Insufficient documentation

## 2022-10-18 ENCOUNTER — Ambulatory Visit (INDEPENDENT_AMBULATORY_CARE_PROVIDER_SITE_OTHER): Payer: 59 | Admitting: Internal Medicine

## 2022-10-18 ENCOUNTER — Encounter: Payer: Self-pay | Admitting: Internal Medicine

## 2022-10-18 VITALS — BP 112/73 | HR 61 | Ht 64.0 in | Wt 197.0 lb

## 2022-10-18 DIAGNOSIS — J4520 Mild intermittent asthma, uncomplicated: Secondary | ICD-10-CM | POA: Diagnosis not present

## 2022-10-18 DIAGNOSIS — I10 Essential (primary) hypertension: Secondary | ICD-10-CM

## 2022-10-18 DIAGNOSIS — E1169 Type 2 diabetes mellitus with other specified complication: Secondary | ICD-10-CM

## 2022-10-18 DIAGNOSIS — J4531 Mild persistent asthma with (acute) exacerbation: Secondary | ICD-10-CM

## 2022-10-18 DIAGNOSIS — E1165 Type 2 diabetes mellitus with hyperglycemia: Secondary | ICD-10-CM

## 2022-10-18 DIAGNOSIS — N1832 Chronic kidney disease, stage 3b: Secondary | ICD-10-CM

## 2022-10-18 DIAGNOSIS — E118 Type 2 diabetes mellitus with unspecified complications: Secondary | ICD-10-CM

## 2022-10-18 DIAGNOSIS — E785 Hyperlipidemia, unspecified: Secondary | ICD-10-CM

## 2022-10-18 LAB — POCT GLYCOSYLATED HEMOGLOBIN (HGB A1C): Hemoglobin A1C: 6.4 % — AB (ref 4.0–5.6)

## 2022-10-18 MED ORDER — AIRSUPRA 90-80 MCG/ACT IN AERO
1.0000 | INHALATION_SPRAY | RESPIRATORY_TRACT | 1 refills | Status: DC | PRN
Start: 2022-10-18 — End: 2023-01-23

## 2022-10-18 NOTE — Assessment & Plan Note (Signed)
HTN remains adequately controlled on current antihypertensive regimen which consists of chlorthalidone 25 mg daily, losartan 100 mg daily, Procardia XL 90 mg daily, and metoprolol succinate 100 mg twice daily.

## 2022-10-18 NOTE — Patient Instructions (Signed)
It was a pleasure to see you today.  Thank you for giving [place] the opportunity to be involved in your care.  Below is a brief recap of your visit and next steps.  We will plan to see you again in 3 months.  Summary Airsupra prescribed today for rescue use. Discontinue albuterol Rpeeat A1c Follow up in 3 months

## 2022-10-18 NOTE — Assessment & Plan Note (Signed)
Lipid panel updated in April.  She is currently prescribed rosuvastatin 40 mg daily.  Ezetimibe 10 mg daily was added in April as LDL remained elevated (109). -No medication changes today.  We will plan to repeat a lipid panel at follow-up in 3 months.

## 2022-10-18 NOTE — Progress Notes (Signed)
Established Patient Office Visit  Subjective   Patient ID: Lindsey Buckley, female    DOB: 1950-11-11  Age: 72 y.o. MRN: [id number]  Chief Complaint  Patient presents with   Hypertension    Follow up   [name] returns to care today for routine follow-up.  She was last evaluated by me on 4/2 for HTN follow-up.  No medication changes were made at that time and 67-month follow-up was arranged.  In the interim she was evaluated at [facility] on 6/17 by Dr. Barbaraann Faster in the setting of an asthma exacerbation.  [name] reports feeling well today.  She is asymptomatic and has no acute concerns to discuss.  Past Medical History:  Diagnosis Date   Asthma 2007   smoke trigged   Chest pain    history of   Diabetes mellitus without complication (HCC) 2021   borderline diabetes per pt per MD    Dysrhythmia 2003   Eczema    GERD (gastroesophageal reflux disease)    Hyperlipidemia    Other and unspecified hyperlipidemia    Pulmonary congestion and hypostasis 2003   Pulmonary edema 2003   Unspecified essential hypertension    Past Surgical History:  Procedure Laterality Date   ABDOMINAL HYSTERECTOMY     partial   COLONOSCOPY     remote past in [place]   COLONOSCOPY WITH PROPOFOL N/A 04/07/2020   non-bleeding internal hemorrhoids, multiple diverticula in entire colon, multiple polyps. Tubular adenomas. Surveillance in 5 years.    POLYPECTOMY  04/07/2020   Procedure: POLYPECTOMY;  Surgeon: Lanelle Bal, DO;  [facility];  Service: Endoscopy;;   ROBOTIC ASSITED PARTIAL NEPHRECTOMY Right 06/25/2020   Procedure: XI ROBOTIC ASSITED PARTIAL NEPHRECTOMY;  Surgeon: Malen Gauze, MD;  Location: [facility];  Service: Urology;  Laterality: Right;   TUBAL LIGATION     Social History   Tobacco Use   Smoking status: Former    Current packs/day: 0.00    Types: Cigarettes    Quit date: 12/11/1999    Years since quitting: 22.8   Smokeless tobacco: Never  Vaping Use   Vaping  status: Never Used  Substance Use Topics   Alcohol use: No   Drug use: No   Family History  Problem Relation Age of Onset   Uterine cancer Sister    Colon cancer Neg Hx    Colon polyps Neg Hx    Allergies  Allergen Reactions   Ace Inhibitors Cough   Lisinopril Cough   Review of Systems  Constitutional:  Negative for chills and fever.  HENT:  Negative for sore throat.   Respiratory:  Negative for cough and shortness of breath.   Cardiovascular:  Negative for chest pain, palpitations and leg swelling.  Gastrointestinal:  Negative for abdominal pain, blood in stool, constipation, diarrhea, nausea and vomiting.  Genitourinary:  Negative for dysuria and hematuria.  Musculoskeletal:  Negative for myalgias.  Skin:  Negative for itching and rash.  Neurological:  Negative for dizziness and headaches.  Psychiatric/Behavioral:  Negative for depression and suicidal ideas.      Objective:     BP 112/73   Pulse 61   Ht 5\' 4"  (1.626 m)   Wt 197 lb (89.4 kg)   SpO2 95%   BMI 33.81 kg/m  BP Readings from Last 3 Encounters:  10/18/22 112/73  09/19/22 (!) 141/83  09/13/22 128/72   Physical Exam Vitals reviewed.  Constitutional:      General: She is not in  acute distress.    Appearance: Normal appearance. She is obese. She is not toxic-appearing.  HENT:     Head: Normocephalic and atraumatic.     Right Ear: External ear normal.     Left Ear: External ear normal.     Nose: Nose normal. No congestion or rhinorrhea.     Mouth/Throat:     Mouth: Mucous membranes are moist.     Pharynx: Oropharynx is clear. No oropharyngeal exudate or posterior oropharyngeal erythema.  Eyes:     General: No scleral icterus.    Extraocular Movements: Extraocular movements intact.     Conjunctiva/sclera: Conjunctivae normal.     Pupils: Pupils are equal, round, and reactive to light.  Cardiovascular:     Rate and Rhythm: Normal rate and regular rhythm.     Pulses: Normal pulses.     Heart  sounds: Normal heart sounds. No murmur heard.    No friction rub. No gallop.  Pulmonary:     Effort: Pulmonary effort is normal.     Breath sounds: Normal breath sounds. No wheezing, rhonchi or rales.  Abdominal:     General: Abdomen is flat. Bowel sounds are normal. There is no distension.     Palpations: Abdomen is soft.     Tenderness: There is no abdominal tenderness.  Musculoskeletal:        General: No swelling. Normal range of motion.     Cervical back: Normal range of motion.     Right lower leg: No edema.     Left lower leg: No edema.  Lymphadenopathy:     Cervical: No cervical adenopathy.  Skin:    General: Skin is warm and dry.     Capillary Refill: Capillary refill takes less than 2 seconds.     Coloration: Skin is not jaundiced.  Neurological:     General: No focal deficit present.     Mental Status: She is alert and oriented to person, place, and time.  Psychiatric:        Mood and Affect: Mood normal.        Behavior: Behavior normal.   Diabetic foot exam was performed.  No deformities or other abnormal visual findings.  Posterior tibialis and dorsalis pulse intact bilaterally.  Intact to touch and monofilament testing bilaterally.    Last CBC Lab Results  Component Value Date   WBC 11.0 (H) 09/13/2022   HGB 12.1 09/13/2022   HCT 37.3 09/13/2022   MCV 91.4 09/13/2022   MCH 29.7 09/13/2022   RDW 13.1 09/13/2022   PLT 281 09/13/2022   Last metabolic panel Lab Results  Component Value Date   GLUCOSE 117 (H) 09/13/2022   NA 139 09/13/2022   K 4.1 09/13/2022   CL 103 09/13/2022   CO2 26 09/13/2022   BUN 28 (H) 09/13/2022   CREATININE 2.13 (H) 09/13/2022   GFRNONAA 24 (L) 09/13/2022   CALCIUM 9.8 09/13/2022   PROT 7.7 07/16/2021   ALBUMIN 4.7 07/16/2021   LABGLOB 3.0 07/16/2021   AGRATIO 1.6 07/16/2021   BILITOT 0.7 07/16/2021   ALKPHOS 71 07/16/2021   AST 30 07/16/2021   ALT 26 07/16/2021   ANIONGAP 10 09/13/2022   Last lipids Lab Results   Component Value Date   CHOL 202 (H) 07/05/2022   HDL 61 07/05/2022   LDLCALC 109 (H) 07/05/2022   TRIG 188 (H) 07/05/2022   CHOLHDL 3.3 07/05/2022   Last hemoglobin A1c Lab Results  Component Value Date   HGBA1C 6.4 (H)  01/25/2022   Last thyroid functions Lab Results  Component Value Date   TSH 1.886 06/13/2009   The 10-year ASCVD risk score (Arnett DK, et al., 2019) is: 22.7%    Assessment & Plan:   Problem List Items Addressed This Visit       Essential hypertension, benign    HTN remains adequately controlled on current antihypertensive regimen which consists of chlorthalidone 25 mg daily, losartan 100 mg daily, Procardia XL 90 mg daily, and metoprolol succinate 100 mg twice daily.      Mild intermittent asthma without complication    Asymptomatic currently.  Pulmonary exam is unremarkable today.  She is prescribed Symbicort for twice daily use and has albuterol for rescue use.  Multiple recent ED presentations for asthma exacerbation. -Paulene Floor was discussed today and she is in agreement with trialing this as a rescue inhaler      Hyperlipidemia associated with type 2 diabetes mellitus (HCC)    Lipid panel updated in April.  She is currently prescribed rosuvastatin 40 mg daily.  Ezetimibe 10 mg daily was added in April as LDL remained elevated (109). -No medication changes today.  We will plan to repeat a lipid panel at follow-up in 3 months.      Controlled diabetes mellitus type 2 with complications (HCC)    A1c 6.4 in October.  She is currently prescribed Farxiga 10 mg daily. -Repeat A1c ordered today -Diabetic foot exam completed today      Stage 3b chronic kidney disease (HCC)    Followed by nephrology.  Follow-up is scheduled for early August.  She will repeat labs today.      Return in about 3 months (around 01/18/2023).   Billie Lade, MD

## 2022-10-18 NOTE — Assessment & Plan Note (Signed)
A1c 6.4 in October.  She is currently prescribed Farxiga 10 mg daily. -Repeat A1c ordered today -Diabetic foot exam completed today

## 2022-10-18 NOTE — Assessment & Plan Note (Signed)
Followed by nephrology.  Follow-up is scheduled for early August.  She will repeat labs today.

## 2022-10-18 NOTE — Assessment & Plan Note (Signed)
Asymptomatic currently.  Pulmonary exam is unremarkable today.  She is prescribed Symbicort for twice daily use and has albuterol for rescue use.  Multiple recent ED presentations for asthma exacerbation. -Lindsey Buckley was discussed today and she is in agreement with trialing this as a rescue inhaler

## 2022-11-10 ENCOUNTER — Other Ambulatory Visit: Payer: Self-pay | Admitting: Internal Medicine

## 2022-11-10 DIAGNOSIS — I10 Essential (primary) hypertension: Secondary | ICD-10-CM

## 2022-11-22 ENCOUNTER — Other Ambulatory Visit: Payer: Self-pay | Admitting: Internal Medicine

## 2022-11-22 DIAGNOSIS — J4520 Mild intermittent asthma, uncomplicated: Secondary | ICD-10-CM

## 2022-11-24 ENCOUNTER — Other Ambulatory Visit: Payer: Self-pay | Admitting: Nurse Practitioner

## 2022-11-24 ENCOUNTER — Other Ambulatory Visit: Payer: Self-pay | Admitting: Family Medicine

## 2023-01-23 ENCOUNTER — Encounter: Payer: Self-pay | Admitting: Internal Medicine

## 2023-01-23 ENCOUNTER — Ambulatory Visit: Payer: 59 | Admitting: Internal Medicine

## 2023-01-23 VITALS — BP 118/74 | HR 65 | Ht 64.0 in | Wt 192.8 lb

## 2023-01-23 DIAGNOSIS — E785 Hyperlipidemia, unspecified: Secondary | ICD-10-CM

## 2023-01-23 DIAGNOSIS — N1832 Chronic kidney disease, stage 3b: Secondary | ICD-10-CM | POA: Diagnosis not present

## 2023-01-23 DIAGNOSIS — E118 Type 2 diabetes mellitus with unspecified complications: Secondary | ICD-10-CM

## 2023-01-23 DIAGNOSIS — J4520 Mild intermittent asthma, uncomplicated: Secondary | ICD-10-CM

## 2023-01-23 DIAGNOSIS — Z7984 Long term (current) use of oral hypoglycemic drugs: Secondary | ICD-10-CM

## 2023-01-23 DIAGNOSIS — I10 Essential (primary) hypertension: Secondary | ICD-10-CM | POA: Diagnosis not present

## 2023-01-23 DIAGNOSIS — Z2821 Immunization not carried out because of patient refusal: Secondary | ICD-10-CM

## 2023-01-23 DIAGNOSIS — E1169 Type 2 diabetes mellitus with other specified complication: Secondary | ICD-10-CM | POA: Diagnosis not present

## 2023-01-23 NOTE — Assessment & Plan Note (Signed)
Nephrology follow-up is scheduled for next month.  She will repeat labs in 2 weeks.

## 2023-01-23 NOTE — Patient Instructions (Signed)
It was a pleasure to see you today.  Thank you for giving [place] the opportunity to be involved in your care.  Below is a brief recap of your visit and next steps.  We will plan to see you again in 3 months.  Summary No medication changes today Repeat lipid panel ordered Follow up in 3 months

## 2023-01-23 NOTE — Assessment & Plan Note (Signed)
 Remains adequately controlled on current antihypertensive regimen.  No medication changes are indicated today.

## 2023-01-23 NOTE — Assessment & Plan Note (Signed)
A1c 6.4 on labs from July.  She is currently prescribed Farxiga 10 mg daily.  No medication changes are indicated today.  Diabetes related preventative care items are up-to-date.

## 2023-01-23 NOTE — Assessment & Plan Note (Signed)
Asymptomatic currently.  Pulmonary exam is unremarkable.  She has not been using Symbicort twice daily as prescribed.  Medication compliance was reinforced today.  Airsupra not covered by insurance.  She has returned to using albuterol for rescue use.

## 2023-01-23 NOTE — Assessment & Plan Note (Signed)
Lipid panel updated in April.  Total cholesterol 202 and LDL 109.  She is currently prescribed rosuvastatin 40 mg daily and ezetimibe 10 mg daily. -Repeat lipid panel ordered today.

## 2023-01-23 NOTE — Progress Notes (Signed)
Established Patient Office Visit  Subjective   Patient ID: Lindsey Buckley, female    DOB: 14-Oct-1950  Age: 72 y.o. MRN: [id number]  Chief Complaint  Patient presents with   Follow-up    3 mo HTN. Has had two cataract surgeries on 12/21/22 and on 01/10/23. Both surgeries were successful.    [name] returns to care today for routine follow-up.  She was last evaluated by me on 7/16.  Paulene Floor was added as a rescue inhaler.  No additional medication changes were made and 30-month follow-up was arranged.  In the interim, she has undergone cataract extraction x 2.  Ophthalmology follow-up is scheduled for later today.  [name] reports feeling well today.  She is asymptomatic and has no acute concerns to discuss.  Past Medical History:  Diagnosis Date   Asthma 2007   smoke trigged   Chest pain    history of   Diabetes mellitus without complication (HCC) 2021   borderline diabetes per pt per MD    Dysrhythmia 2003   Eczema    GERD (gastroesophageal reflux disease)    Hyperlipidemia    Other and unspecified hyperlipidemia    Pulmonary congestion and hypostasis 2003   Pulmonary edema 2003   Unspecified essential hypertension    Past Surgical History:  Procedure Laterality Date   ABDOMINAL HYSTERECTOMY     partial   COLONOSCOPY     remote past in [place]   COLONOSCOPY WITH PROPOFOL N/A 04/07/2020   non-bleeding internal hemorrhoids, multiple diverticula in entire colon, multiple polyps. Tubular adenomas. Surveillance in 5 years.    POLYPECTOMY  04/07/2020   Procedure: POLYPECTOMY;  Surgeon: Lanelle Bal, DO;  Location: [facility];  Service: Endoscopy;;   ROBOTIC ASSITED PARTIAL NEPHRECTOMY Right 06/25/2020   Procedure: XI ROBOTIC ASSITED PARTIAL NEPHRECTOMY;  Surgeon: Malen Gauze, MD;  Location: [facility];  Service: Urology;  Laterality: Right;   TUBAL LIGATION     Social History   Tobacco Use   Smoking status: Former    Current packs/day: 0.00     Types: Cigarettes    Quit date: 12/11/1999    Years since quitting: 23.1   Smokeless tobacco: Never  Vaping Use   Vaping status: Never Used  Substance Use Topics   Alcohol use: No   Drug use: No   Family History  Problem Relation Age of Onset   Uterine cancer Sister    Colon cancer Neg Hx    Colon polyps Neg Hx    Allergies  Allergen Reactions   Lisinopril Cough   Review of Systems  Constitutional:  Negative for chills and fever.  HENT:  Negative for sore throat.   Respiratory:  Negative for cough and shortness of breath.   Cardiovascular:  Negative for chest pain, palpitations and leg swelling.  Gastrointestinal:  Negative for abdominal pain, blood in stool, constipation, diarrhea, nausea and vomiting.  Genitourinary:  Negative for dysuria and hematuria.  Musculoskeletal:  Negative for myalgias.  Skin:  Negative for itching and rash.  Neurological:  Negative for dizziness and headaches.  Psychiatric/Behavioral:  Negative for depression and suicidal ideas.      Objective:     BP 118/74   Pulse 65   Ht 5\' 4"  (1.626 m)   Wt 192 lb 12.8 oz (87.5 kg)   SpO2 97%   BMI 33.09 kg/m  BP Readings from Last 3 Encounters:  01/23/23 118/74  10/18/22 112/73  09/19/22 (!) 141/83   Physical Exam  Vitals reviewed.  Constitutional:      General: She is not in acute distress.    Appearance: Normal appearance. She is obese. She is not toxic-appearing.  HENT:     Head: Normocephalic and atraumatic.     Right Ear: External ear normal.     Left Ear: External ear normal.     Nose: Nose normal. No congestion or rhinorrhea.     Mouth/Throat:     Mouth: Mucous membranes are moist.     Pharynx: Oropharynx is clear. No oropharyngeal exudate or posterior oropharyngeal erythema.  Eyes:     General: No scleral icterus.    Extraocular Movements: Extraocular movements intact.     Conjunctiva/sclera: Conjunctivae normal.     Pupils: Pupils are equal, round, and reactive to light.   Cardiovascular:     Rate and Rhythm: Normal rate and regular rhythm.     Pulses: Normal pulses.     Heart sounds: Normal heart sounds. No murmur heard.    No friction rub. No gallop.  Pulmonary:     Effort: Pulmonary effort is normal.     Breath sounds: Normal breath sounds. No wheezing, rhonchi or rales.  Abdominal:     General: Abdomen is flat. Bowel sounds are normal. There is no distension.     Palpations: Abdomen is soft.     Tenderness: There is no abdominal tenderness.  Musculoskeletal:        General: No swelling. Normal range of motion.     Cervical back: Normal range of motion.     Right lower leg: No edema.     Left lower leg: No edema.  Lymphadenopathy:     Cervical: No cervical adenopathy.  Skin:    General: Skin is warm and dry.     Capillary Refill: Capillary refill takes less than 2 seconds.     Coloration: Skin is not jaundiced.  Neurological:     General: No focal deficit present.     Mental Status: She is alert and oriented to person, place, and time.  Psychiatric:        Mood and Affect: Mood normal.        Behavior: Behavior normal.   Last CBC Lab Results  Component Value Date   WBC 11.0 (H) 09/13/2022   HGB 12.1 09/13/2022   HCT 37.3 09/13/2022   MCV 91.4 09/13/2022   MCH 29.7 09/13/2022   RDW 13.1 09/13/2022   PLT 281 09/13/2022   Last metabolic panel Lab Results  Component Value Date   GLUCOSE 117 (H) 09/13/2022   NA 139 09/13/2022   K 4.1 09/13/2022   CL 103 09/13/2022   CO2 26 09/13/2022   BUN 28 (H) 09/13/2022   CREATININE 2.13 (H) 09/13/2022   GFRNONAA 24 (L) 09/13/2022   CALCIUM 9.8 09/13/2022   PROT 7.7 07/16/2021   ALBUMIN 4.7 07/16/2021   LABGLOB 3.0 07/16/2021   AGRATIO 1.6 07/16/2021   BILITOT 0.7 07/16/2021   ALKPHOS 71 07/16/2021   AST 30 07/16/2021   ALT 26 07/16/2021   ANIONGAP 10 09/13/2022   Last lipids Lab Results  Component Value Date   CHOL 202 (H) 07/05/2022   HDL 61 07/05/2022   LDLCALC 109 (H)  07/05/2022   TRIG 188 (H) 07/05/2022   CHOLHDL 3.3 07/05/2022   Last hemoglobin A1c Lab Results  Component Value Date   HGBA1C 6.4 (A) 10/18/2022   Last thyroid functions Lab Results  Component Value Date   TSH 1.886 06/13/2009  The 10-year ASCVD risk score (Arnett DK, et al., 2019) is: 24.7%    Assessment & Plan:   Problem List Items Addressed This Visit       Essential hypertension, benign    Remains adequately controlled on current antihypertensive regimen.  No medication changes are indicated today.      Mild intermittent asthma without complication    Asymptomatic currently.  Pulmonary exam is unremarkable.  She has not been using Symbicort twice daily as prescribed.  Medication compliance was reinforced today.  Airsupra not covered by insurance.  She has returned to using albuterol for rescue use.      Hyperlipidemia associated with type 2 diabetes mellitus (HCC) - Primary    Lipid panel updated in April.  Total cholesterol 202 and LDL 109.  She is currently prescribed rosuvastatin 40 mg daily and ezetimibe 10 mg daily. -Repeat lipid panel ordered today.      Controlled diabetes mellitus type 2 with complications (HCC)    A1c 6.4 on labs from July.  She is currently prescribed Farxiga 10 mg daily.  No medication changes are indicated today.  Diabetes related preventative care items are up-to-date.      Stage 3b chronic kidney disease [facility]    Nephrology follow-up is scheduled for next month.  She will repeat labs in 2 weeks.      Return in about 3 months (around 04/25/2023).   Billie Lade, MD

## 2023-01-24 LAB — LIPID PANEL
Chol/HDL Ratio: 3.1 ratio (ref 0.0–4.4)
Cholesterol, Total: 148 mg/dL (ref 100–199)
HDL: 47 mg/dL (ref >39)
LDL Chol Calc (NIH): 78 mg/dL (ref 0–99)
Triglycerides: 130 mg/dL (ref 0–149)
VLDL Cholesterol Cal: 23 mg/dL (ref 5–40)

## 2023-02-15 ENCOUNTER — Ambulatory Visit: Payer: 59

## 2023-02-15 VITALS — Ht 64.0 in | Wt 197.0 lb

## 2023-02-15 DIAGNOSIS — Z0000 Encounter for general adult medical examination without abnormal findings: Secondary | ICD-10-CM

## 2023-02-15 DIAGNOSIS — Z1231 Encounter for screening mammogram for malignant neoplasm of breast: Secondary | ICD-10-CM

## 2023-02-15 DIAGNOSIS — Z780 Asymptomatic menopausal state: Secondary | ICD-10-CM

## 2023-02-15 NOTE — Patient Instructions (Signed)
Lindsey Buckley , Thank you for taking time to come for your Medicare Wellness Visit. I appreciate your ongoing commitment to your health goals. Please review the following plan we discussed and let me know if I can assist you in the future.   Referrals/Orders/Follow-Ups/Clinician Recommendations: Aim for 30 minutes of exercise or brisk walking, 6-8 glasses of water, and 5 servings of fruits and vegetables each day.   This is a list of the screening recommended for you and due dates:  Health Maintenance  Topic Date Due   Mammogram  03/01/2022   Zoster (Shingles) Vaccine (1 of 2) 04/25/2023*   Flu Shot  07/03/2023*   Pneumonia Vaccine (1 of 2 - PCV) 07/21/2023*   Hemoglobin A1C  04/20/2023   Complete foot exam   10/18/2023   Eye exam for diabetics  01/23/2024   Medicare Annual Wellness Visit  02/15/2024   Colon Cancer Screening  04/07/2030   DEXA scan (bone density measurement)  Completed   Hepatitis C Screening  Completed   HPV Vaccine  Aged Out   DTaP/Tdap/Td vaccine  Discontinued   COVID-19 Vaccine  Discontinued  *Topic was postponed. The date shown is not the original due date.    Advanced directives: (Provided) Advance directive discussed with you today. I have provided a copy for you to complete at home and have notarized. Once this is complete, please bring a copy in to our office so we can scan it into your chart.   Next Medicare Annual Wellness Visit scheduled for next year: Yes  Insert Preventive Care attachment Insert FALL PREVENTION attachment if needed

## 2023-02-15 NOTE — Progress Notes (Signed)
Subjective:   Lindsey Buckley is a 72 y.o. female who presents for Medicare Annual (Subsequent) preventive examination.  Visit Complete: Virtual I connected with  Lindsey Buckley on 02/15/23 by a audio enabled telemedicine application and verified that I am speaking with the correct person using two identifiers.  Patient Location: Home  Provider Location: Home Office  I discussed the limitations of evaluation and management by telemedicine. The patient expressed understanding and agreed to proceed.  Vital Signs: Because this visit was a virtual/telehealth visit, some criteria may be missing or patient reported. Any vitals not documented were not able to be obtained and vitals that have been documented are patient reported.  Patient Medicare AWV questionnaire was completed by the patient on 02/15/2023; I have confirmed that all information answered by patient is correct and no changes since this date.  Cardiac Risk Factors include: advanced age (>86men, >48 women);diabetes mellitus;dyslipidemia;hypertension     Objective:    Today's Vitals   02/15/23 0902  Weight: 197 lb (89.4 kg)  Height: 5\' 4"  (1.626 m)   Body mass index is 33.81 kg/m.     02/15/2023    9:07 AM 09/13/2022    8:17 PM 09/12/2022   10:02 AM 01/28/2021   11:41 AM 11/21/2020   10:23 AM 06/25/2020    6:30 PM 06/15/2020    8:56 AM  Advanced Directives  Does Patient Have a Medical Advance Directive? No No No No No No No  Would patient like information on creating a medical advance directive? Yes (MAU/Ambulatory/Procedural Areas - Information given)  Yes (ED - Information included in AVS) No - Patient declined  No - Patient declined     Current Medications (verified) Outpatient Encounter Medications as of 02/15/2023  Medication Sig   albuterol (PROVENTIL) (2.5 MG/3ML) 0.083% nebulizer solution USE 1 VIAL VIA NEBULIZER THREE TIMES DAILY AS NEEDED FOR WHEEZING OR SHORTNESS OF BREATH   budesonide-formoterol  (SYMBICORT) 80-4.5 MCG/ACT inhaler Take 2 puffs first thing in am and then another 2 puffs about 12 hours later. (Patient taking differently: Inhale 2 puffs into the lungs 2 (two) times daily.)   chlorthalidone (HYGROTON) 25 MG tablet TAKE 1 TABLET(25 MG) BY MOUTH DAILY   cycloSPORINE (RESTASIS) 0.05 % ophthalmic emulsion Place 1 drop into both eyes 2 (two) times daily.   ezetimibe (ZETIA) 10 MG tablet Take 1 tablet (10 mg total) by mouth daily.   FARXIGA 10 MG TABS tablet Take 1 tablet (10 mg total) by mouth daily.   losartan (COZAAR) 100 MG tablet TAKE 1 TABLET(100 MG) BY MOUTH DAILY (Patient taking differently: Take 100 mg by mouth daily.)   metoprolol succinate (TOPROL-XL) 100 MG 24 hr tablet Take 1 tablet (100 mg total) by mouth in the morning and at bedtime.   montelukast (SINGULAIR) 10 MG tablet TAKE 1 TABLET(10 MG) BY MOUTH AT BEDTIME   montelukast (SINGULAIR) 10 MG tablet Take 1 tablet by mouth at bedtime.   NIFEdipine (PROCARDIA XL/NIFEDICAL-XL) 90 MG 24 hr tablet Take 1 tablet (90 mg total) by mouth daily.   pantoprazole (PROTONIX) 40 MG tablet Take 40 mg by mouth daily.   prednisoLONE acetate (PRED FORTE) 1 % ophthalmic suspension Place 2 drops into both eyes every 2 (two) hours while awake.   RESTASIS 0.05 % ophthalmic emulsion Place 1 drop into both eyes 2 (two) times daily.   rosuvastatin (CRESTOR) 40 MG tablet Take 1 tablet (40 mg total) by mouth daily.   VENTOLIN HFA 108 (90 Base) MCG/ACT inhaler  Inhale 2 puffs into the lungs every 4 (four) hours as needed.   No facility-administered encounter medications on file as of 02/15/2023.    Allergies (verified) Lisinopril   History: Past Medical History:  Diagnosis Date   Asthma 2007   smoke trigged   Chest pain    history of   Diabetes mellitus without complication (HCC) 2021   borderline diabetes per pt per MD    Dysrhythmia 2003   Eczema    GERD (gastroesophageal reflux disease)    Hyperlipidemia    Other and  unspecified hyperlipidemia    Pulmonary congestion and hypostasis 2003   Pulmonary edema 2003   Unspecified essential hypertension    Past Surgical History:  Procedure Laterality Date   ABDOMINAL HYSTERECTOMY     partial   COLONOSCOPY     remote past in [place]   COLONOSCOPY WITH PROPOFOL N/A 04/07/2020   non-bleeding internal hemorrhoids, multiple diverticula in entire colon, multiple polyps. Tubular adenomas. Surveillance in 5 years.    POLYPECTOMY  04/07/2020   Procedure: POLYPECTOMY;  Surgeon: Lanelle Bal, DO;  Location: [facility];  Service: Endoscopy;;   ROBOTIC ASSITED PARTIAL NEPHRECTOMY Right 06/25/2020   Procedure: XI ROBOTIC ASSITED PARTIAL NEPHRECTOMY;  Surgeon: Malen Gauze, MD;  Location: [facility];  Service: Urology;  Laterality: Right;   TUBAL LIGATION     Family History  Problem Relation Age of Onset   Uterine cancer Sister    Colon cancer Neg Hx    Colon polyps Neg Hx    Social History   Socioeconomic History   Marital status: Single    Spouse name: Not on file   Number of children: 3   Years of education: Not on file   Highest education level: Not on file  Occupational History   Occupation: Full time   Occupation: CNA-part time; 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  Tobacco Use   Smoking status: Former    Current packs/day: 0.00    Types: Cigarettes    Quit date: 12/11/1999    Years since quitting: 23.1   Smokeless tobacco: Never  Vaping Use   Vaping status: Never Used  Substance and Sexual Activity   Alcohol use: No   Drug use: No   Sexual activity: Not Currently  Other Topics Concern   Not on file  Social History Narrative   Divorced,3 children,No regular exercise   1 son in [place], 2 children in [place]   Social Determinants of Health   Financial Resource Strain: Low Risk  (02/15/2023)   Overall Financial Resource Strain (CARDIA)    Difficulty of Paying Living Expenses: Not hard at all  Food Insecurity: No Food Insecurity (02/15/2023)    Hunger Vital Sign    Worried About Running Out of Food in the Last Year: Never true    Ran Out of Food in the Last Year: Never true  Transportation Needs: No Transportation Needs (02/15/2023)   PRAPARE - Administrator, Civil Service (Medical): No    Lack of Transportation (Non-Medical): No  Physical Activity: Sufficiently Active (02/15/2023)   Exercise Vital Sign    Days of Exercise per Week: 5 days    Minutes of Exercise per Session: 30 min  Stress: No Stress Concern Present (02/15/2023)   [employer] of Occupational Health - Occupational Stress Questionnaire    Feeling of Stress : Not at all  Social Connections: Moderately Isolated (02/15/2023)   Social Connection and Isolation Panel [NHANES]    Frequency of  Communication with Friends and Family: More than three times a week    Frequency of Social Gatherings with Friends and Family: More than three times a week    Attends Religious Services: 1 to 4 times per year    Active Member of [employer] or Organizations: No    Attends Engineer, structural: Never    Marital Status: Divorced    Tobacco Counseling Counseling given: Not Answered   Clinical Intake:  Pre-visit preparation completed: Yes  Pain : No/denies pain     Nutritional Risks: None Diabetes: Yes CBG done?: No Did pt. bring in CBG monitor from home?: No  How often do you need to have someone help you when you read instructions, pamphlets, or other written materials from your doctor or pharmacy?: 1 - Never  Interpreter Needed?: No  Information entered by :: Renie Ora, LPN   Activities of Daily Living    02/15/2023    9:07 AM  In your present state of health, do you have any difficulty performing the following activities:  Hearing? 0  Vision? 0  Difficulty concentrating or making decisions? 0  Walking or climbing stairs? 0  Dressing or bathing? 0  Doing errands, shopping? 0  Preparing Food and eating ? N  Using the Toilet? N   In the past six months, have you accidently leaked urine? N  Do you have problems with loss of bowel control? N  Managing your Medications? N  Managing your Finances? N  Housekeeping or managing your Housekeeping? N    Patient Care Team: Billie Lade, MD as PCP - General (Internal Medicine) Wyline Mood Dorothe Pea, MD as Consulting Physician (Cardiology)  Indicate any recent Medical Services you may have received from other than [facility] providers in the past year (date may be approximate).     Assessment:   This is a routine wellness examination for [facility].  Hearing/Vision screen Vision Screening - Comments:: Wears rx glasses - up to date with routine eye exams with  Dr.Johnson    Goals Addressed             This Visit's Progress    DIET - EAT MORE FRUITS AND VEGETABLES         Depression Screen    02/15/2023    9:06 AM 01/23/2023    8:24 AM 10/18/2022    8:08 AM 09/19/2022    9:54 AM 07/05/2022    8:12 AM 06/07/2022    8:31 AM 01/25/2022    9:48 AM  PHQ 2/9 Scores  PHQ - 2 Score 0 0 0 0 0 0 0  PHQ- 9 Score 0 1 3  3  0     Fall Risk    02/15/2023    9:04 AM 01/23/2023    8:23 AM 10/18/2022    8:08 AM 09/19/2022    9:54 AM 07/05/2022    8:12 AM  Fall Risk   Falls in the past year? 0 0 0 0 0  Number falls in past yr: 0 0 0 0 0  Injury with Fall? 0 0 0 0 0  Risk for fall due to : No Fall Risks Other (Comment) No Fall Risks  No Fall Risks  Risk for fall due to: Comment  age     Follow up Falls prevention discussed Follow up appointment Falls evaluation completed  Falls evaluation completed    MEDICARE RISK AT HOME: Medicare Risk at Home Any stairs in or around the home?: No If so, are there  any without handrails?: No Home free of loose throw rugs in walkways, pet beds, electrical cords, etc?: Yes Adequate lighting in your home to reduce risk of falls?: Yes Life alert?: No Use of a cane, walker or w/c?: No Grab bars in the bathroom?: No Shower chair or bench in  shower?: No Elevated toilet seat or a handicapped toilet?: No  TIMED UP AND GO:  Was the test performed?  No    Cognitive Function:    01/28/2021   11:43 AM  MMSE - Mini Mental State Exam  Not completed: Unable to complete        02/15/2023    9:08 AM 01/28/2021   11:43 AM  6CIT Screen  What Year? 0 points 0 points  What month? 0 points 0 points  What time? 0 points 0 points  Count back from 20 0 points 0 points  Months in reverse 0 points 0 points  Repeat phrase 0 points 0 points  Total Score 0 points 0 points    Immunizations Immunization History  Administered Date(s) Administered   Moderna SARS-COV2 Booster Vaccination 08/07/2019, 08/25/2019, 09/25/2019    TDAP status: Due, Education has been provided regarding the importance of this vaccine. Advised may receive this vaccine at local pharmacy or Health Dept. Aware to provide a copy of the vaccination record if obtained from local pharmacy or Health Dept. Verbalized acceptance and understanding.  Flu Vaccine status: Declined, Education has been provided regarding the importance of this vaccine but patient still declined. Advised may receive this vaccine at local pharmacy or Health Dept. Aware to provide a copy of the vaccination record if obtained from local pharmacy or Health Dept. Verbalized acceptance and understanding.  Pneumococcal vaccine status: Declined,  Education has been provided regarding the importance of this vaccine but patient still declined. Advised may receive this vaccine at local pharmacy or Health Dept. Aware to provide a copy of the vaccination record if obtained from local pharmacy or Health Dept. Verbalized acceptance and understanding.   Covid-19 vaccine status: Completed vaccines  Qualifies for Shingles Vaccine? Yes   Zostavax completed No   Shingrix Completed?: No.    Education has been provided regarding the importance of this vaccine. Patient has been advised to call insurance company to  determine out of pocket expense if they have not yet received this vaccine. Advised may also receive vaccine at local pharmacy or Health Dept. Verbalized acceptance and understanding.  Screening Tests Health Maintenance  Topic Date Due   MAMMOGRAM  03/01/2022   Zoster Vaccines- Shingrix (1 of 2) 04/25/2023 (Originally 04/21/1969)   INFLUENZA VACCINE  07/03/2023 (Originally 11/03/2022)   Pneumonia Vaccine 38+ Years old (1 of 2 - PCV) 07/21/2023 (Originally 04/21/1956)   HEMOGLOBIN A1C  04/20/2023   FOOT EXAM  10/18/2023   OPHTHALMOLOGY EXAM  01/23/2024   Medicare Annual Wellness (AWV)  02/15/2024   Colonoscopy  04/07/2030   DEXA SCAN  Completed   Hepatitis C Screening  Completed   HPV VACCINES  Aged Out   DTaP/Tdap/Td  Discontinued   COVID-19 Vaccine  Discontinued    Health Maintenance  Health Maintenance Due  Topic Date Due   MAMMOGRAM  03/01/2022    Colorectal cancer screening: Type of screening: Colonoscopy. Completed 04/07/2020. Repeat every 10 years  Mammogram status: Ordered 02/15/2023. Pt provided with contact info and advised to call to schedule appt.   Bone Density status: Ordered 02/15/2023. Pt provided with contact info and advised to call to schedule appt.  Lung Cancer  Screening: (Low Dose CT Chest recommended if Age 43-80 years, 20 pack-year currently smoking OR have quit w/in 15years.) does not qualify.   Lung Cancer Screening Referral: n/a  Additional Screening:  Hepatitis C Screening: does not qualify; Completed 03/01/2021  Vision Screening: Recommended annual ophthalmology exams for early detection of glaucoma and other disorders of the eye. Is the patient up to date with their annual eye exam?  Yes  Who is the provider or what is the name of the office in which the patient attends annual eye exams? Dr.Cotter  If pt is not established with a provider, would they like to be referred to a provider to establish care? No .   Dental Screening: Recommended annual  dental exams for proper oral hygiene  Diabetic Foot Exam: Diabetic Foot Exam: Overdue, Pt has been advised about the importance in completing this exam. Pt is scheduled for diabetic foot exam on next office visit .  Community Resource Referral / Chronic Care Management: CRR required this visit?  No   CCM required this visit?  No     Plan:     I have personally reviewed and noted the following in the patient's chart:   Medical and social history Use of alcohol, tobacco or illicit drugs  Current medications and supplements including opioid prescriptions. Patient is not currently taking opioid prescriptions. Functional ability and status Nutritional status Physical activity Advanced directives List of other physicians Hospitalizations, surgeries, and ER visits in previous 12 months Vitals Screenings to include cognitive, depression, and falls Referrals and appointments  In addition, I have reviewed and discussed with patient certain preventive protocols, quality metrics, and best practice recommendations. A written personalized care plan for preventive services as well as general preventive health recommendations were provided to patient.     Lorrene Reid, LPN   [id number]/29/5621   After Visit Summary: (MyChart) Due to this being a telephonic visit, the after visit summary with patients personalized plan was offered to patient via MyChart   Nurse Notes: Declines all vaccines

## 2023-02-17 ENCOUNTER — Other Ambulatory Visit: Payer: Self-pay | Admitting: Internal Medicine

## 2023-02-17 DIAGNOSIS — I10 Essential (primary) hypertension: Secondary | ICD-10-CM

## 2023-04-25 ENCOUNTER — Ambulatory Visit: Payer: 59 | Admitting: Internal Medicine

## 2023-04-25 ENCOUNTER — Encounter: Payer: Self-pay | Admitting: Internal Medicine

## 2023-04-25 VITALS — BP 126/78 | HR 70 | Ht 64.0 in | Wt 192.6 lb

## 2023-04-25 DIAGNOSIS — Z7984 Long term (current) use of oral hypoglycemic drugs: Secondary | ICD-10-CM

## 2023-04-25 DIAGNOSIS — I10 Essential (primary) hypertension: Secondary | ICD-10-CM | POA: Diagnosis not present

## 2023-04-25 DIAGNOSIS — E785 Hyperlipidemia, unspecified: Secondary | ICD-10-CM

## 2023-04-25 DIAGNOSIS — E1169 Type 2 diabetes mellitus with other specified complication: Secondary | ICD-10-CM

## 2023-04-25 DIAGNOSIS — J4520 Mild intermittent asthma, uncomplicated: Secondary | ICD-10-CM

## 2023-04-25 DIAGNOSIS — N1832 Chronic kidney disease, stage 3b: Secondary | ICD-10-CM

## 2023-04-25 DIAGNOSIS — E118 Type 2 diabetes mellitus with unspecified complications: Secondary | ICD-10-CM

## 2023-04-25 LAB — BAYER DCA HB A1C WAIVED: HB A1C (BAYER DCA - WAIVED): 6 % — ABNORMAL HIGH (ref 4.8–5.6)

## 2023-04-25 MED ORDER — PREDNISONE 10 MG (21) PO TBPK
ORAL_TABLET | ORAL | 0 refills | Status: DC
Start: 2023-04-25 — End: 2023-07-25

## 2023-04-25 MED ORDER — ALBUTEROL SULFATE (2.5 MG/3ML) 0.083% IN NEBU
2.5000 mg | INHALATION_SOLUTION | Freq: Three times a day (TID) | RESPIRATORY_TRACT | 0 refills | Status: DC | PRN
Start: 2023-04-25 — End: 2023-05-18

## 2023-04-25 NOTE — Assessment & Plan Note (Signed)
 Remains adequately controlled on current antihypertensive regimen.  No medication changes are indicated today.

## 2023-04-25 NOTE — Assessment & Plan Note (Signed)
Lipid panel updated in October 2024.  Total cholesterol 148 and LDL 78.  Both values improved from April.  She remains on rosuvastatin 40 mg daily and Zetia 10 mg daily.

## 2023-04-25 NOTE — Progress Notes (Signed)
Established Patient Office Visit  Subjective   Patient ID: Lindsey Buckley, female    DOB: 1951-02-07  Age: 73 y.o. MRN: [id number]  Chief Complaint  Patient presents with   Hyperlipidemia    Three month follow up   Asthma    Asthma flare up , patient is needing a rx sent to [facility] for a new nebulizer tubing and mouth piece    Cough    Patient has a dry cough for two months   [name] returns to care today for routine follow-up.  She was last evaluated by me in October 2024.  No medication changes were made at that time and 17-month follow-up was arranged.  There have been no acute interval events. [name] reports feeling fairly well today.  She endorses intermittent shortness of breath and increased cough in recent days.  She has been using her albuterol nebulizer more frequently and requests a refill of nebulizer solution.  She does not have any additional concerns to discuss.  Past Medical History:  Diagnosis Date   Asthma 2007   smoke trigged   Chest pain    history of   Diabetes mellitus without complication (HCC) 2021   borderline diabetes per pt per MD    Dysrhythmia 2003   Eczema    GERD (gastroesophageal reflux disease)    Hyperlipidemia    Other and unspecified hyperlipidemia    Pulmonary congestion and hypostasis 2003   Pulmonary edema 2003   Unspecified essential hypertension    Past Surgical History:  Procedure Laterality Date   ABDOMINAL HYSTERECTOMY     partial   COLONOSCOPY     remote past in [place]   COLONOSCOPY WITH PROPOFOL N/A 04/07/2020   non-bleeding internal hemorrhoids, multiple diverticula in entire colon, multiple polyps. Tubular adenomas. Surveillance in 5 years.    POLYPECTOMY  04/07/2020   Procedure: POLYPECTOMY;  Surgeon: Lanelle Bal, DO;  [facility];  Service: Endoscopy;;   ROBOTIC ASSITED PARTIAL NEPHRECTOMY Right 06/25/2020   Procedure: XI ROBOTIC ASSITED PARTIAL NEPHRECTOMY;  Surgeon:  Malen Gauze, MD;  Location: [facility];  Service: Urology;  Laterality: Right;   TUBAL LIGATION     Social History   Tobacco Use   Smoking status: Former    Current packs/day: 0.00    Types: Cigarettes    Quit date: 12/11/1999    Years since quitting: 23.3   Smokeless tobacco: Never  Vaping Use   Vaping status: Never Used  Substance Use Topics   Alcohol use: No   Drug use: No   Family History  Problem Relation Age of Onset   Uterine cancer Sister    Colon cancer Neg Hx    Colon polyps Neg Hx    Allergies  Allergen Reactions   Lisinopril Cough   Review of Systems  Respiratory:  Positive for cough and shortness of breath. Negative for sputum production.   All other systems reviewed and are negative.    Objective:     BP 126/78 (BP Location: Left Arm, Patient Position: Sitting, Cuff Size: Normal)   Pulse 70   Ht 5\' 4"  (1.626 m)   Wt 192 lb 9.6 oz (87.4 kg)   SpO2 92%   BMI 33.06 kg/m  BP Readings from Last 3 Encounters:  04/25/23 126/78  01/23/23 118/74  10/18/22 112/73   Physical Exam Vitals reviewed.  Constitutional:      General: She is not in acute distress.    Appearance: Normal  appearance. She is obese. She is not toxic-appearing.  HENT:     Head: Normocephalic and atraumatic.     Right Ear: External ear normal.     Left Ear: External ear normal.     Nose: Nose normal. No congestion or rhinorrhea.     Mouth/Throat:     Mouth: Mucous membranes are moist.     Pharynx: Oropharynx is clear. No oropharyngeal exudate or posterior oropharyngeal erythema.  Eyes:     General: No scleral icterus.    Extraocular Movements: Extraocular movements intact.     Conjunctiva/sclera: Conjunctivae normal.     Pupils: Pupils are equal, round, and reactive to light.  Cardiovascular:     Rate and Rhythm: Normal rate and regular rhythm.     Pulses: Normal pulses.     Heart sounds: Normal heart sounds. No murmur heard.    No friction rub. No gallop.  Pulmonary:      Effort: Pulmonary effort is normal.     Breath sounds: Normal breath sounds. No wheezing, rhonchi or rales.  Abdominal:     General: Abdomen is flat. Bowel sounds are normal. There is no distension.     Palpations: Abdomen is soft.     Tenderness: There is no abdominal tenderness.  Musculoskeletal:        General: No swelling. Normal range of motion.     Cervical back: Normal range of motion.     Right lower leg: No edema.     Left lower leg: No edema.  Lymphadenopathy:     Cervical: No cervical adenopathy.  Skin:    General: Skin is warm and dry.     Capillary Refill: Capillary refill takes less than 2 seconds.     Coloration: Skin is not jaundiced.  Neurological:     General: No focal deficit present.     Mental Status: She is alert and oriented to person, place, and time.  Psychiatric:        Mood and Affect: Mood normal.        Behavior: Behavior normal.   Last CBC Lab Results  Component Value Date   WBC 11.0 (H) 09/13/2022   HGB 12.1 09/13/2022   HCT 37.3 09/13/2022   MCV 91.4 09/13/2022   MCH 29.7 09/13/2022   RDW 13.1 09/13/2022   PLT 281 09/13/2022   Last metabolic panel Lab Results  Component Value Date   GLUCOSE 117 (H) 09/13/2022   NA 139 09/13/2022   K 4.1 09/13/2022   CL 103 09/13/2022   CO2 26 09/13/2022   BUN 28 (H) 09/13/2022   CREATININE 2.13 (H) 09/13/2022   GFRNONAA 24 (L) 09/13/2022   CALCIUM 9.8 09/13/2022   PROT 7.7 07/16/2021   ALBUMIN 4.7 07/16/2021   LABGLOB 3.0 07/16/2021   AGRATIO 1.6 07/16/2021   BILITOT 0.7 07/16/2021   ALKPHOS 71 07/16/2021   AST 30 07/16/2021   ALT 26 07/16/2021   ANIONGAP 10 09/13/2022   Last lipids Lab Results  Component Value Date   CHOL 148 01/23/2023   HDL 47 01/23/2023   LDLCALC 78 01/23/2023   TRIG 130 01/23/2023   CHOLHDL 3.1 01/23/2023   Last hemoglobin A1c Lab Results  Component Value Date   HGBA1C 6.4 (A) 10/18/2022   Last thyroid functions Lab Results  Component Value Date   TSH 1.886  06/13/2009   The 10-year ASCVD risk score (Arnett DK, et al., 2019) is: 21.4%    Assessment & Plan:   Problem List Items  Addressed This Visit       Essential hypertension, benign   Remains adequately controlled on current antihypertensive regimen.  No medication changes are indicated today.      Mild intermittent asthma without complication   Her acute concern today is asthma.  She endorses increased shortness of breath and nonproductive cough recently.  She has been using her albuterol nebulizer more frequently.  Pulmonary exam is reassuring today.  She endorses compliance with Symbicort twice daily. -Albuterol nebulizer solution refilled today -New orders placed for nebulizer -Prednisone pack prescribed today given reported symptoms -Continue Symbicort      Hyperlipidemia associated with type 2 diabetes mellitus (HCC)   Lipid panel updated in October 2024.  Total cholesterol 148 and LDL 78.  Both values improved from April.  She remains on rosuvastatin 40 mg daily and Zetia 10 mg daily.      Controlled diabetes mellitus type 2 with complications (HCC) - Primary   A1c 6.4 on labs from July.  She remains on Farxiga 10 mg daily.  Repeat A1c ordered today.      Stage 3b chronic kidney disease (HCC)   Currently on ARB and SGLT2i.  Nephrology follow-up is scheduled for next month (2/7) and she will complete labs before the end of the month.      Return in about 3 months (around 07/24/2023).   Billie Lade, MD

## 2023-04-25 NOTE — Assessment & Plan Note (Signed)
Her acute concern today is asthma.  She endorses increased shortness of breath and nonproductive cough recently.  She has been using her albuterol nebulizer more frequently.  Pulmonary exam is reassuring today.  She endorses compliance with Symbicort twice daily. -Albuterol nebulizer solution refilled today -New orders placed for nebulizer -Prednisone pack prescribed today given reported symptoms -Continue Symbicort

## 2023-04-25 NOTE — Assessment & Plan Note (Signed)
Currently on ARB and SGLT2i.  Nephrology follow-up is scheduled for next month (2/7) and she will complete labs before the end of the month.

## 2023-04-25 NOTE — Assessment & Plan Note (Signed)
A1c 6.4 on labs from July.  She remains on Farxiga 10 mg daily.  Repeat A1c ordered today.

## 2023-04-25 NOTE — Patient Instructions (Signed)
It was a pleasure to see you today.  Thank you for giving [place] the opportunity to be involved in your care.  Below is a brief recap of your visit and next steps.  We will plan to see you again in 3 months.  Summary Prednisone pack prescribed today for asthma Nebulizer and albuterol refilled Check A1c Follow up in 3 months

## 2023-05-17 ENCOUNTER — Other Ambulatory Visit: Payer: Self-pay | Admitting: Internal Medicine

## 2023-05-17 DIAGNOSIS — J4520 Mild intermittent asthma, uncomplicated: Secondary | ICD-10-CM

## 2023-05-17 DIAGNOSIS — I10 Essential (primary) hypertension: Secondary | ICD-10-CM

## 2023-06-07 ENCOUNTER — Other Ambulatory Visit: Payer: Self-pay | Admitting: Internal Medicine

## 2023-06-07 DIAGNOSIS — J45991 Cough variant asthma: Secondary | ICD-10-CM

## 2023-06-19 ENCOUNTER — Other Ambulatory Visit: Payer: Self-pay | Admitting: Nurse Practitioner

## 2023-06-19 ENCOUNTER — Other Ambulatory Visit: Payer: Self-pay | Admitting: Internal Medicine

## 2023-06-19 DIAGNOSIS — E785 Hyperlipidemia, unspecified: Secondary | ICD-10-CM

## 2023-06-19 DIAGNOSIS — I10 Essential (primary) hypertension: Secondary | ICD-10-CM

## 2023-06-19 DIAGNOSIS — E1165 Type 2 diabetes mellitus with hyperglycemia: Secondary | ICD-10-CM

## 2023-06-19 DIAGNOSIS — J45991 Cough variant asthma: Secondary | ICD-10-CM

## 2023-06-29 ENCOUNTER — Other Ambulatory Visit: Payer: Self-pay | Admitting: Internal Medicine

## 2023-06-29 DIAGNOSIS — E1169 Type 2 diabetes mellitus with other specified complication: Secondary | ICD-10-CM

## 2023-07-07 ENCOUNTER — Other Ambulatory Visit: Payer: Self-pay | Admitting: Internal Medicine

## 2023-07-07 DIAGNOSIS — I10 Essential (primary) hypertension: Secondary | ICD-10-CM

## 2023-07-14 ENCOUNTER — Other Ambulatory Visit: Payer: Self-pay | Admitting: Internal Medicine

## 2023-07-14 DIAGNOSIS — E785 Hyperlipidemia, unspecified: Secondary | ICD-10-CM

## 2023-07-20 ENCOUNTER — Other Ambulatory Visit: Payer: Self-pay | Admitting: Internal Medicine

## 2023-07-20 DIAGNOSIS — J45991 Cough variant asthma: Secondary | ICD-10-CM

## 2023-07-24 ENCOUNTER — Other Ambulatory Visit: Payer: Self-pay | Admitting: Internal Medicine

## 2023-07-25 ENCOUNTER — Ambulatory Visit: Payer: 59 | Admitting: Internal Medicine

## 2023-07-25 ENCOUNTER — Encounter: Payer: Self-pay | Admitting: Internal Medicine

## 2023-07-25 VITALS — BP 118/74 | HR 70 | Ht 64.0 in | Wt 185.4 lb

## 2023-07-25 DIAGNOSIS — E1169 Type 2 diabetes mellitus with other specified complication: Secondary | ICD-10-CM | POA: Diagnosis not present

## 2023-07-25 DIAGNOSIS — I10 Essential (primary) hypertension: Secondary | ICD-10-CM

## 2023-07-25 DIAGNOSIS — Z7984 Long term (current) use of oral hypoglycemic drugs: Secondary | ICD-10-CM

## 2023-07-25 DIAGNOSIS — E1159 Type 2 diabetes mellitus with other circulatory complications: Secondary | ICD-10-CM | POA: Diagnosis not present

## 2023-07-25 DIAGNOSIS — J4520 Mild intermittent asthma, uncomplicated: Secondary | ICD-10-CM | POA: Diagnosis not present

## 2023-07-25 DIAGNOSIS — N1832 Chronic kidney disease, stage 3b: Secondary | ICD-10-CM

## 2023-07-25 DIAGNOSIS — E118 Type 2 diabetes mellitus with unspecified complications: Secondary | ICD-10-CM

## 2023-07-25 DIAGNOSIS — E785 Hyperlipidemia, unspecified: Secondary | ICD-10-CM

## 2023-07-25 DIAGNOSIS — E66811 Obesity, class 1: Secondary | ICD-10-CM

## 2023-07-25 NOTE — Assessment & Plan Note (Signed)
 Remains adequately controlled on current antihypertensive regimen.  No medication changes are indicated today.

## 2023-07-25 NOTE — Assessment & Plan Note (Signed)
 Lindsey Buckley remains focused lifestyle changes aimed at weight loss.  She has lost 7 pounds since her last appointment by increasing her exercise regimen.  She was congratulated on her progress.

## 2023-07-25 NOTE — Assessment & Plan Note (Signed)
 Stable.  Followed by nephrology, last seen in February.  Remains on ARB and SGLT2i.  Follow-up as scheduled for June.

## 2023-07-25 NOTE — Assessment & Plan Note (Signed)
 Lipid panel updated in October 2024.  Total cholesterol 148 and LDL 78.  She remains on rosuvastatin  40 mg daily and Zetia  10 mg daily.

## 2023-07-25 NOTE — Assessment & Plan Note (Signed)
 Asymptomatic currently.  Pulmonary exam is unremarkable.  She is using Symbicort  twice daily and infrequently requires use of albuterol  nebulizer.  No changes are indicated today.

## 2023-07-25 NOTE — Patient Instructions (Signed)
It was a pleasure to see you today.  Thank you for giving [place] the opportunity to be involved in your care.  Below is a brief recap of your visit and next steps.  We will plan to see you again in 6 months  Summary No medication changes today Follow up in 6 months

## 2023-07-25 NOTE — Assessment & Plan Note (Signed)
 A1c 6.0 on labs from January.  She remains on Farxiga  10 mg daily.  Diabetes related preventative care items are up-to-date.

## 2023-07-25 NOTE — Progress Notes (Signed)
 Established Patient Office Visit  Subjective   Patient ID: Lindsey Buckley, female    DOB: 06-Nov-1950  Age: 73 y.o. MRN: [id number]  Chief Complaint  Patient presents with   Care Management    Three month follow up    [name] returns to care today for routine follow-up.  She was last evaluated by me on 1/21.  At that time she endorsed increasing shortness of breath and a nonproductive cough, resulting in increased use of her albuterol  nebulizer.  Prednisone  pack added for symptom relief, repeat labs ordered, and 51-month follow-up arranged.  There have been no acute interval events.  Today she reports feeling well and has no acute concerns to discuss.  Past Medical History:  Diagnosis Date   Asthma 2007   smoke trigged   Chest pain    history of   Diabetes mellitus without complication (HCC) 2021   borderline diabetes per pt per MD    Dysrhythmia 2003   Eczema    GERD (gastroesophageal reflux disease)    Hyperlipidemia    Other and unspecified hyperlipidemia    Pulmonary congestion and hypostasis 2003   Pulmonary edema 2003   Unspecified essential hypertension    Past Surgical History:  Procedure Laterality Date   ABDOMINAL HYSTERECTOMY     partial   COLONOSCOPY     remote past in [place]    COLONOSCOPY WITH PROPOFOL  N/A 04/07/2020   non-bleeding internal hemorrhoids, multiple diverticula in entire colon, multiple polyps. Tubular adenomas. Surveillance in 5 years.    POLYPECTOMY  04/07/2020   Procedure: POLYPECTOMY;  Surgeon: Vinetta Greening, DO;  Location: [facility];  Service: Endoscopy;;   ROBOTIC ASSITED PARTIAL NEPHRECTOMY Right 06/25/2020   Procedure: XI ROBOTIC ASSITED PARTIAL NEPHRECTOMY;  Surgeon: Marco Severs, MD;  Location: [facility];  Service: Urology;  Laterality: Right;   TUBAL LIGATION     Social History   Tobacco Use   Smoking status: Former    Current packs/day: 0.00    Types: Cigarettes    Quit date: 12/11/1999    Years since  quitting: 23.6   Smokeless tobacco: Never  Vaping Use   Vaping status: Never Used  Substance Use Topics   Alcohol  use: No   Drug use: No   Family History  Problem Relation Age of Onset   Uterine cancer Sister    Colon cancer Neg Hx    Colon polyps Neg Hx    Allergies  Allergen Reactions   Lisinopril Cough   Review of Systems  Constitutional:  Negative for chills and fever.  HENT:  Negative for sore throat.   Respiratory:  Negative for cough and shortness of breath.   Cardiovascular:  Negative for chest pain, palpitations and leg swelling.  Gastrointestinal:  Negative for abdominal pain, blood in stool, constipation, diarrhea, nausea and vomiting.  Genitourinary:  Negative for dysuria and hematuria.  Musculoskeletal:  Negative for myalgias.  Skin:  Negative for itching and rash.  Neurological:  Negative for dizziness and headaches.  Psychiatric/Behavioral:  Negative for depression and suicidal ideas.      Objective:     BP 118/74   Pulse 70   Ht 5\' 4"  (1.626 m)   Wt 185 lb 6.4 oz (84.1 kg)   SpO2 95%   BMI 31.82 kg/m  BP Readings from Last 3 Encounters:  07/25/23 118/74  04/25/23 126/78  01/23/23 118/74   Physical Exam Vitals reviewed.  Constitutional:      General: She is not  in acute distress.    Appearance: Normal appearance. She is obese. She is not toxic-appearing.  HENT:     Head: Normocephalic and atraumatic.     Right Ear: External ear normal.     Left Ear: External ear normal.     Nose: Nose normal. No congestion or rhinorrhea.     Mouth/Throat:     Mouth: Mucous membranes are moist.     Pharynx: Oropharynx is clear. No oropharyngeal exudate or posterior oropharyngeal erythema.  Eyes:     General: No scleral icterus.    Extraocular Movements: Extraocular movements intact.     Conjunctiva/sclera: Conjunctivae normal.     Pupils: Pupils are equal, round, and reactive to light.  Cardiovascular:     Rate and Rhythm: Normal rate and regular rhythm.      Pulses: Normal pulses.     Heart sounds: Normal heart sounds. No murmur heard.    No friction rub. No gallop.  Pulmonary:     Effort: Pulmonary effort is normal.     Breath sounds: Normal breath sounds. No wheezing, rhonchi or rales.  Abdominal:     General: Abdomen is flat. Bowel sounds are normal. There is no distension.     Palpations: Abdomen is soft.     Tenderness: There is no abdominal tenderness.  Musculoskeletal:        General: No swelling. Normal range of motion.     Cervical back: Normal range of motion.     Right lower leg: No edema.     Left lower leg: No edema.  Lymphadenopathy:     Cervical: No cervical adenopathy.  Skin:    General: Skin is warm and dry.     Capillary Refill: Capillary refill takes less than 2 seconds.     Coloration: Skin is not jaundiced.  Neurological:     General: No focal deficit present.     Mental Status: She is alert and oriented to person, place, and time.  Psychiatric:        Mood and Affect: Mood normal.        Behavior: Behavior normal.   Last CBC Lab Results  Component Value Date   WBC 11.0 (H) 09/13/2022   HGB 12.1 09/13/2022   HCT 37.3 09/13/2022   MCV 91.4 09/13/2022   MCH 29.7 09/13/2022   RDW 13.1 09/13/2022   PLT 281 09/13/2022   Last metabolic panel Lab Results  Component Value Date   GLUCOSE 117 (H) 09/13/2022   NA 139 09/13/2022   K 4.1 09/13/2022   CL 103 09/13/2022   CO2 26 09/13/2022   BUN 28 (H) 09/13/2022   CREATININE 2.13 (H) 09/13/2022   GFRNONAA 24 (L) 09/13/2022   CALCIUM  9.8 09/13/2022   PROT 7.7 07/16/2021   ALBUMIN 4.7 07/16/2021   LABGLOB 3.0 07/16/2021   AGRATIO 1.6 07/16/2021   BILITOT 0.7 07/16/2021   ALKPHOS 71 07/16/2021   AST 30 07/16/2021   ALT 26 07/16/2021   ANIONGAP 10 09/13/2022   Last lipids Lab Results  Component Value Date   CHOL 148 01/23/2023   HDL 47 01/23/2023   LDLCALC 78 01/23/2023   TRIG 130 01/23/2023   CHOLHDL 3.1 01/23/2023   Last hemoglobin A1c Lab  Results  Component Value Date   HGBA1C 6.0 (H) 04/25/2023   Last thyroid functions Lab Results  Component Value Date   TSH 1.886 06/13/2009   The 10-year ASCVD risk score (Arnett DK, et al., 2019) is: 19.3%  Assessment & Plan:   Problem List Items Addressed This Visit       Essential hypertension, benign - Primary   Remains adequately controlled on current antihypertensive regimen.  No medication changes are indicated today.      Mild intermittent asthma without complication   Asymptomatic currently.  Pulmonary exam is unremarkable.  She is using Symbicort  twice daily and infrequently requires use of albuterol  nebulizer.  No changes are indicated today.      Hyperlipidemia associated with type 2 diabetes mellitus (HCC)   Lipid panel updated in October 2024.  Total cholesterol 148 and LDL 78.  She remains on rosuvastatin  40 mg daily and Zetia  10 mg daily.      Controlled diabetes mellitus type 2 with complications (HCC)   A1c 6.0 on labs from January.  She remains on Farxiga  10 mg daily.  Diabetes related preventative care items are up-to-date.      Stage 3b chronic kidney disease (HCC)   Stable.  Followed by nephrology, last seen in February.  Remains on ARB and SGLT2i.  Follow-up as scheduled for June.      Obesity (BMI 30.0-34.9)   [name] remains focused lifestyle changes aimed at weight loss.  She has lost 7 pounds since her last appointment by increasing her exercise regimen.  She was congratulated on her progress.       Return in about 6 months (around 01/24/2024).    Tobi Fortes, MD

## 2023-07-29 ENCOUNTER — Other Ambulatory Visit: Payer: Self-pay | Admitting: Internal Medicine

## 2023-08-09 ENCOUNTER — Encounter (HOSPITAL_COMMUNITY): Payer: Self-pay

## 2023-08-09 ENCOUNTER — Ambulatory Visit (HOSPITAL_COMMUNITY)
Admission: RE | Admit: 2023-08-09 | Discharge: 2023-08-09 | Disposition: A | Discharge status: Home / Self Care | Source: Ambulatory Visit | Attending: Internal Medicine | Admitting: Internal Medicine

## 2023-08-09 ENCOUNTER — Ambulatory Visit (HOSPITAL_COMMUNITY)
Admission: RE | Admit: 2023-08-09 | Discharge: 2023-08-09 | Disposition: A | Discharge status: Home / Self Care | Source: Ambulatory Visit | Attending: Internal Medicine

## 2023-08-09 DIAGNOSIS — Z1231 Encounter for screening mammogram for malignant neoplasm of breast: Secondary | ICD-10-CM | POA: Diagnosis present

## 2023-08-09 DIAGNOSIS — Z780 Asymptomatic menopausal state: Secondary | ICD-10-CM | POA: Diagnosis present

## 2023-08-14 ENCOUNTER — Other Ambulatory Visit: Payer: Self-pay | Admitting: Internal Medicine

## 2023-08-14 DIAGNOSIS — I10 Essential (primary) hypertension: Secondary | ICD-10-CM

## 2023-08-14 DIAGNOSIS — J45991 Cough variant asthma: Secondary | ICD-10-CM

## 2023-09-16 ENCOUNTER — Other Ambulatory Visit: Payer: Self-pay | Admitting: Internal Medicine

## 2023-09-16 DIAGNOSIS — J45991 Cough variant asthma: Secondary | ICD-10-CM

## 2023-10-13 ENCOUNTER — Other Ambulatory Visit: Payer: Self-pay

## 2023-10-13 DIAGNOSIS — J45991 Cough variant asthma: Secondary | ICD-10-CM

## 2023-10-18 ENCOUNTER — Other Ambulatory Visit: Payer: Self-pay | Admitting: Internal Medicine

## 2023-10-18 DIAGNOSIS — J4520 Mild intermittent asthma, uncomplicated: Secondary | ICD-10-CM

## 2023-11-11 ENCOUNTER — Other Ambulatory Visit: Payer: Self-pay | Admitting: Internal Medicine

## 2023-11-11 DIAGNOSIS — I10 Essential (primary) hypertension: Secondary | ICD-10-CM

## 2023-11-12 ENCOUNTER — Other Ambulatory Visit: Payer: Self-pay

## 2023-11-12 DIAGNOSIS — J45991 Cough variant asthma: Secondary | ICD-10-CM

## 2023-11-13 ENCOUNTER — Other Ambulatory Visit: Payer: Self-pay | Admitting: Internal Medicine

## 2023-11-13 DIAGNOSIS — I10 Essential (primary) hypertension: Secondary | ICD-10-CM

## 2023-11-13 DIAGNOSIS — E1165 Type 2 diabetes mellitus with hyperglycemia: Secondary | ICD-10-CM

## 2023-12-23 ENCOUNTER — Other Ambulatory Visit: Payer: Self-pay

## 2023-12-23 DIAGNOSIS — J45991 Cough variant asthma: Secondary | ICD-10-CM

## 2024-01-17 ENCOUNTER — Other Ambulatory Visit: Payer: Self-pay

## 2024-01-17 DIAGNOSIS — J4520 Mild intermittent asthma, uncomplicated: Secondary | ICD-10-CM

## 2024-01-17 DIAGNOSIS — J45991 Cough variant asthma: Secondary | ICD-10-CM

## 2024-01-24 ENCOUNTER — Ambulatory Visit

## 2024-01-24 VITALS — BP 122/76 | HR 65 | Ht 64.0 in | Wt 177.0 lb

## 2024-01-24 DIAGNOSIS — E785 Hyperlipidemia, unspecified: Secondary | ICD-10-CM

## 2024-01-24 DIAGNOSIS — J45991 Cough variant asthma: Secondary | ICD-10-CM

## 2024-01-24 DIAGNOSIS — E1122 Type 2 diabetes mellitus with diabetic chronic kidney disease: Secondary | ICD-10-CM

## 2024-01-24 DIAGNOSIS — J4520 Mild intermittent asthma, uncomplicated: Secondary | ICD-10-CM

## 2024-01-24 DIAGNOSIS — N186 End stage renal disease: Secondary | ICD-10-CM | POA: Diagnosis not present

## 2024-01-24 DIAGNOSIS — E1169 Type 2 diabetes mellitus with other specified complication: Secondary | ICD-10-CM | POA: Diagnosis not present

## 2024-01-24 DIAGNOSIS — J4531 Mild persistent asthma with (acute) exacerbation: Secondary | ICD-10-CM

## 2024-01-24 DIAGNOSIS — I10 Essential (primary) hypertension: Secondary | ICD-10-CM

## 2024-01-24 DIAGNOSIS — R109 Unspecified abdominal pain: Secondary | ICD-10-CM

## 2024-01-24 DIAGNOSIS — K219 Gastro-esophageal reflux disease without esophagitis: Secondary | ICD-10-CM

## 2024-01-24 DIAGNOSIS — C641 Malignant neoplasm of right kidney, except renal pelvis: Secondary | ICD-10-CM | POA: Diagnosis not present

## 2024-01-24 MED ORDER — NIFEDIPINE ER OSMOTIC RELEASE 90 MG PO TB24
90.0000 mg | ORAL_TABLET | Freq: Every day | ORAL | 3 refills | Status: AC
Start: 2024-01-24 — End: ?

## 2024-01-24 MED ORDER — CHLORTHALIDONE 25 MG PO TABS: 25.0000 mg | ORAL_TABLET | Freq: Every day | ORAL | 3 refills | Status: AC

## 2024-01-24 MED ORDER — PANTOPRAZOLE SODIUM 40 MG PO TBEC: 40.0000 mg | DELAYED_RELEASE_TABLET | Freq: Every day | ORAL | 3 refills | Status: AC

## 2024-01-24 MED ORDER — LOSARTAN POTASSIUM 100 MG PO TABS: 100.0000 mg | ORAL_TABLET | Freq: Every day | ORAL | 3 refills | Status: DC

## 2024-01-24 MED ORDER — SYMBICORT 80-4.5 MCG/ACT IN AERO: 2.0000 | INHALATION_SPRAY | Freq: Two times a day (BID) | RESPIRATORY_TRACT | 11 refills | Status: AC

## 2024-01-24 MED ORDER — PREDNISONE 10 MG (21) PO TBPK: ORAL_TABLET | ORAL | 0 refills | Status: DC

## 2024-01-24 MED ORDER — ROSUVASTATIN CALCIUM 40 MG PO TABS: 40.0000 mg | ORAL_TABLET | Freq: Every day | ORAL | 3 refills | Status: AC

## 2024-01-24 MED ORDER — EZETIMIBE 10 MG PO TABS: 10.0000 mg | ORAL_TABLET | Freq: Every day | ORAL | 3 refills | Status: AC

## 2024-01-24 MED ORDER — MONTELUKAST SODIUM 10 MG PO TABS: 10.0000 mg | ORAL_TABLET | Freq: Every day | ORAL | 3 refills | Status: AC

## 2024-01-24 MED ORDER — PREDNISONE 10 MG (21) PO TBPK: ORAL_TABLET | ORAL | 0 refills | Status: AC

## 2024-01-24 NOTE — Progress Notes (Signed)
 Established Patient Office Visit  Subjective   Patient ID: Lindsey Buckley, female    DOB: November 19, 1950  Age: 73 y.o. MRN: [id number]  Chief Complaint  Patient presents with   Medical Management of Chronic Issues    6 month follow up    HPI Discussed the use of AI scribe software for clinical note transcription with the patient, who gave verbal consent to proceed.  History of Present Illness   Lindsey Buckley is a 73 year old female who presents with side pain and medication concerns.  Flank pain and medication concerns - Side pain present since initiation of Protonix  in August 2025 - Concern for potential renal effects due to prior kidney procedure in 2022 (spot removed from kidney)  Gastrointestinal symptoms - Occasional diarrhea associated with specific foods such as orange juice and ice cream - No frequent diarrhea - No constipation  Asthma exacerbation and medication access issues - Asthma symptoms worsen at night and during fall due to allergens (tree pollen, firewood smoke) - Unable to obtain Symbicort  prescription from [facility] despite multiple attempts - Asthma symptoms have worsened over the past two weeks due to lack of controller medication - Requests prednisone  for symptom management - Albuterol  inhaler available for pickup  History of covid-19 infection - Contracted COVID-19 during preoperative visit for kidney procedure in 2022, resulting in postponement of surgery      Patient Active Problem List   Diagnosis Date Noted   Type 2 diabetes mellitus with end-stage renal disease (HCC) 01/24/2024   Mild persistent asthma with acute exacerbation 09/19/2022   Ganglion cyst of dorsum of left wrist 06/07/2022   Preventative health care 12/28/2021   Tinnitus 07/20/2021   Stage 3b chronic kidney disease (HCC) 07/20/2021   Hypercalcemia 07/20/2021   Cough variant asthma vs uacs 05/28/2021   DOE (dyspnea on exertion) 05/28/2021   Mild intermittent asthma  without complication 03/05/2021   Controlled diabetes mellitus type 2 with complications (HCC) 03/05/2021   Obesity (BMI 30.0-34.9) 03/05/2021   Wheezing 01/19/2021   Closed left ankle fracture 12/22/2020   Renal cell cancer, right (HCC) 07/03/2020   History of colonic polyps 06/09/2020   Heme positive stool 03/10/2020   Change in bowel habits 03/10/2020   Left sided abdominal pain 03/10/2020   Chest pain 05/16/2012   Hyperlipidemia associated with type 2 diabetes mellitus (HCC) 12/30/2009   GASTROESOPHAGEAL REFLUX DISEASE 12/30/2009   Palpitations 12/30/2009   Essential hypertension, benign 08/22/2008      ROS    Objective:     BP 122/76 (BP Location: Left Arm, Patient Position: Sitting, Cuff Size: Normal)   Pulse 65   Ht 5' 4 (1.626 m)   Wt 177 lb 0.6 oz (80.3 kg)   SpO2 94%   BMI 30.39 kg/m  BP Readings from Last 3 Encounters:  01/24/24 122/76  07/25/23 118/74  04/25/23 126/78   Wt Readings from Last 3 Encounters:  01/24/24 177 lb 0.6 oz (80.3 kg)  07/25/23 185 lb 6.4 oz (84.1 kg)  04/25/23 192 lb 9.6 oz (87.4 kg)      Physical Exam Vitals and nursing note reviewed.  Constitutional:      Appearance: Normal appearance. She is obese.  HENT:     Head: Normocephalic.     Nose: No congestion.  Eyes:     Extraocular Movements: Extraocular movements intact.     Pupils: Pupils are equal, round, and reactive to light.  Cardiovascular:     Rate and Rhythm: Normal  rate and regular rhythm.  Pulmonary:     Effort: Pulmonary effort is normal.     Breath sounds: Normal breath sounds.  Musculoskeletal:     Cervical back: Normal range of motion and neck supple.  Neurological:     Mental Status: She is alert and oriented to person, place, and time.  Psychiatric:        Mood and Affect: Mood normal.        Thought Content: Thought content normal.      No results found for any visits on 01/24/24.  Last CBC Lab Results  Component Value Date   WBC 11.0 (H)  09/13/2022   HGB 12.1 09/13/2022   HCT 37.3 09/13/2022   MCV 91.4 09/13/2022   MCH 29.7 09/13/2022   RDW 13.1 09/13/2022   PLT 281 09/13/2022   Last metabolic panel Lab Results  Component Value Date   GLUCOSE 117 (H) 09/13/2022   NA 139 09/13/2022   K 4.1 09/13/2022   CL 103 09/13/2022   CO2 26 09/13/2022   BUN 28 (H) 09/13/2022   CREATININE 2.13 (H) 09/13/2022   GFRNONAA 24 (L) 09/13/2022   CALCIUM  9.8 09/13/2022   PROT 7.7 07/16/2021   ALBUMIN 4.7 07/16/2021   LABGLOB 3.0 07/16/2021   AGRATIO 1.6 07/16/2021   BILITOT 0.7 07/16/2021   ALKPHOS 71 07/16/2021   AST 30 07/16/2021   ALT 26 07/16/2021   ANIONGAP 10 09/13/2022   Last lipids Lab Results  Component Value Date   CHOL 148 01/23/2023   HDL 47 01/23/2023   LDLCALC 78 01/23/2023   TRIG 130 01/23/2023   CHOLHDL 3.1 01/23/2023   Last hemoglobin A1c Lab Results  Component Value Date   HGBA1C 6.0 (H) 04/25/2023   Last thyroid functions Lab Results  Component Value Date   TSH 1.886 06/13/2009   FREET4 8.0 06/13/2009    The 10-year ASCVD risk score (Arnett DK, et al., 2019) is: 20.4%    Assessment & Plan:   Problem List Items Addressed This Visit       Cardiovascular and Mediastinum   Essential hypertension, benign   Remains adequately controlled on current antihypertensive regimen.  No medication changes are indicated today.      Relevant Medications   chlorthalidone  (HYGROTON ) 25 MG tablet   ezetimibe  (ZETIA ) 10 MG tablet   losartan  (COZAAR ) 100 MG tablet   NIFEdipine  (PROCARDIA  XL/NIFEDICAL-XL) 90 MG 24 hr tablet   rosuvastatin  (CRESTOR ) 40 MG tablet     Respiratory   Mild intermittent asthma without complication   Relevant Medications   montelukast  (SINGULAIR ) 10 MG tablet   SYMBICORT  80-4.5 MCG/ACT inhaler   predniSONE  (STERAPRED UNI-PAK 21 TAB) 10 MG (21) TBPK tablet   Cough variant asthma vs uacs   Asthma exacerbation likely due to environmental allergens. Difficulty obtaining  Symbicort . - Prescribe prednisone  and send prescription to [facility]. - Ensure all asthma medications are refilled at [facility].      Relevant Medications   montelukast  (SINGULAIR ) 10 MG tablet   SYMBICORT  80-4.5 MCG/ACT inhaler   predniSONE  (STERAPRED UNI-PAK 21 TAB) 10 MG (21) TBPK tablet   Mild persistent asthma with acute exacerbation   Relevant Medications   montelukast  (SINGULAIR ) 10 MG tablet   SYMBICORT  80-4.5 MCG/ACT inhaler   predniSONE  (STERAPRED UNI-PAK 21 TAB) 10 MG (21) TBPK tablet     Digestive   GASTROESOPHAGEAL REFLUX DISEASE   Relevant Medications   pantoprazole  (PROTONIX ) 40 MG tablet     Endocrine  Hyperlipidemia associated with type 2 diabetes mellitus (HCC)   She remains on rosuvastatin  40 mg daily and Zetia  10 mg daily. No changes made at this time.        Relevant Medications   chlorthalidone  (HYGROTON ) 25 MG tablet   ezetimibe  (ZETIA ) 10 MG tablet   losartan  (COZAAR ) 100 MG tablet   NIFEdipine  (PROCARDIA  XL/NIFEDICAL-XL) 90 MG 24 hr tablet   rosuvastatin  (CRESTOR ) 40 MG tablet   Type 2 diabetes mellitus with end-stage renal disease (HCC) - Primary   Chronic kidney disease managed with regular kidney function monitoring. Recent A1c reported.      Relevant Medications   losartan  (COZAAR ) 100 MG tablet   rosuvastatin  (CRESTOR ) 40 MG tablet   Other Visit Diagnoses       Renal carcinoma, right (HCC)       Relevant Medications   predniSONE  (STERAPRED UNI-PAK 21 TAB) 10 MG (21) TBPK tablet     Right sided abdominal pain       Right-sided abdominal pain possibly related to medication use, near recent partial nephrectomy site. - Order abdominal ultrasound.   Relevant Orders   US  Renal     Hyperlipidemia, unspecified hyperlipidemia type       Relevant Medications   chlorthalidone  (HYGROTON ) 25 MG tablet   ezetimibe  (ZETIA ) 10 MG tablet   losartan  (COZAAR ) 100 MG tablet   NIFEdipine  (PROCARDIA  XL/NIFEDICAL-XL) 90 MG 24 hr tablet   rosuvastatin   (CRESTOR ) 40 MG tablet       Return in about 6 months (around 07/24/2024) for chronic follow-up with PCP.    Leita Longs, FNP

## 2024-01-28 NOTE — Assessment & Plan Note (Signed)
 Chronic kidney disease managed with regular kidney function monitoring. Recent A1c reported.

## 2024-01-28 NOTE — Assessment & Plan Note (Signed)
 Asthma exacerbation likely due to environmental allergens. Difficulty obtaining Symbicort . - Prescribe prednisone  and send prescription to [facility]. - Ensure all asthma medications are refilled at [facility].

## 2024-01-28 NOTE — Assessment & Plan Note (Signed)
 She remains on rosuvastatin  40 mg daily and Zetia  10 mg daily. No changes made at this time.

## 2024-01-28 NOTE — Assessment & Plan Note (Signed)
 Remains adequately controlled on current antihypertensive regimen.  No medication changes are indicated today.

## 2024-01-31 ENCOUNTER — Ambulatory Visit (HOSPITAL_COMMUNITY)
Admission: RE | Admit: 2024-01-31 | Discharge: 2024-01-31 | Disposition: A | Discharge status: Home / Self Care | Source: Ambulatory Visit

## 2024-01-31 DIAGNOSIS — R109 Unspecified abdominal pain: Secondary | ICD-10-CM | POA: Insufficient documentation

## 2024-02-14 ENCOUNTER — Ambulatory Visit: Payer: Self-pay

## 2024-02-14 ENCOUNTER — Other Ambulatory Visit: Payer: Self-pay

## 2024-02-14 DIAGNOSIS — N2889 Other specified disorders of kidney and ureter: Secondary | ICD-10-CM

## 2024-02-16 ENCOUNTER — Other Ambulatory Visit: Payer: Self-pay

## 2024-02-19 ENCOUNTER — Telehealth: Payer: Self-pay

## 2024-02-19 NOTE — Telephone Encounter (Signed)
 Refills sent to pharmacy.

## 2024-02-19 NOTE — Telephone Encounter (Signed)
 Prescription Request  02/19/2024  LOV: 01/24/2024  What is the name of the medication or equipment? losartan  (COZAAR ) 100 MG tablet [[id number]]   metoprolol  succinate (TOPROL -XL) 100 MG 24 hr tablet     Have you contacted your pharmacy to request a refill? Yes   Which pharmacy would you like this sent to?   [facility] [address] [place]   Patient notified that their request is being sent to the clinical staff for review and that they should receive a response within 2 business days.   Please advise at walked into office

## 2024-02-20 ENCOUNTER — Other Ambulatory Visit: Payer: Self-pay

## 2024-02-20 ENCOUNTER — Other Ambulatory Visit: Payer: Self-pay | Admitting: Internal Medicine

## 2024-02-20 DIAGNOSIS — J4520 Mild intermittent asthma, uncomplicated: Secondary | ICD-10-CM

## 2024-02-20 DIAGNOSIS — I10 Essential (primary) hypertension: Secondary | ICD-10-CM

## 2024-02-20 MED ORDER — METOPROLOL SUCCINATE ER 100 MG PO TB24
100.0000 mg | ORAL_TABLET | Freq: Every day | ORAL | 3 refills | Status: DC
Start: 2024-02-20 — End: 2024-02-21

## 2024-02-20 MED ORDER — LOSARTAN POTASSIUM 100 MG PO TABS: 100.0000 mg | ORAL_TABLET | Freq: Every day | ORAL | 3 refills | Status: DC

## 2024-02-20 NOTE — Telephone Encounter (Signed)
 Patient here to pick up a paper copy pharmacy not seen rx yet teams sent to clinical.

## 2024-02-20 NOTE — Addendum Note (Signed)
 Addended by: LORELI MIRIAM SAUNDERS on: 02/20/2024 10:18 AM   Modules accepted: Orders

## 2024-02-20 NOTE — Telephone Encounter (Signed)
 Printed and given to patient.

## 2024-02-21 ENCOUNTER — Other Ambulatory Visit: Payer: Self-pay

## 2024-02-21 DIAGNOSIS — I10 Essential (primary) hypertension: Secondary | ICD-10-CM

## 2024-02-21 MED ORDER — LOSARTAN POTASSIUM 100 MG PO TABS: 100.0000 mg | ORAL_TABLET | Freq: Every day | ORAL | 3 refills | Status: AC

## 2024-02-21 MED ORDER — METOPROLOL SUCCINATE ER 100 MG PO TB24
100.0000 mg | ORAL_TABLET | Freq: Every day | ORAL | 3 refills | Status: AC
Start: 2024-02-21 — End: ?

## 2024-02-22 ENCOUNTER — Ambulatory Visit

## 2024-03-13 ENCOUNTER — Other Ambulatory Visit: Payer: Self-pay

## 2024-03-13 ENCOUNTER — Telehealth: Payer: Self-pay

## 2024-03-13 DIAGNOSIS — J4520 Mild intermittent asthma, uncomplicated: Secondary | ICD-10-CM

## 2024-03-13 MED ORDER — ALBUTEROL SULFATE (2.5 MG/3ML) 0.083% IN NEBU: INHALATION_SOLUTION | RESPIRATORY_TRACT | 0 refills | Status: AC

## 2024-03-13 NOTE — Telephone Encounter (Signed)
 Sent to pharmacy

## 2024-03-13 NOTE — Telephone Encounter (Signed)
 Prescription Request  03/13/2024  LOV: 01/24/2024  What is the name of the medication or equipment? albuterol  (PROVENTIL ) (2.5 MG/3ML) 0.083% nebulizer solution [[id number]]   Have you contacted your pharmacy to request a refill? Yes   Which pharmacy would you like this sent to?  [facility] [address] [place]  Patient notified that their request is being sent to the clinical staff for review and that they should receive a response within 2 business days.   Please advise at walked into office

## 2024-03-22 ENCOUNTER — Other Ambulatory Visit: Payer: Self-pay

## 2024-04-10 ENCOUNTER — Ambulatory Visit (HOSPITAL_COMMUNITY)
Admission: RE | Admit: 2024-04-10 | Discharge: 2024-04-10 | Disposition: A | Discharge status: Home / Self Care | Source: Ambulatory Visit

## 2024-04-10 ENCOUNTER — Other Ambulatory Visit: Payer: Self-pay

## 2024-04-10 DIAGNOSIS — N2889 Other specified disorders of kidney and ureter: Secondary | ICD-10-CM | POA: Insufficient documentation

## 2024-04-10 LAB — POCT I-STAT CREATININE: Creatinine, Ser: 2.1 mg/dL — ABNORMAL HIGH (ref 0.44–1.00)

## 2024-04-10 MED ORDER — IOHEXOL 300 MG/ML  SOLN
100.0000 mL | Freq: Once | INTRAMUSCULAR | Status: AC | PRN
  Administered 2024-04-10: 100 mL via INTRAVENOUS

## 2024-04-13 ENCOUNTER — Other Ambulatory Visit: Payer: Self-pay

## 2024-07-08 ENCOUNTER — Ambulatory Visit: Payer: Self-pay

## 2024-07-24 ENCOUNTER — Ambulatory Visit
